# Patient Record
Sex: Male | Born: 1960 | Race: Black or African American | Hispanic: No | Marital: Married | Smoking: Former smoker
Health system: Southern US, Community
[De-identification: ages and names within clinical notes are randomized; demographics above are authoritative.]

## PROBLEM LIST (undated history)

## (undated) ENCOUNTER — Ambulatory Visit: Admission: EM | Discharge status: Absent without leave | Payer: BC Managed Care – PPO

## (undated) DIAGNOSIS — G629 Polyneuropathy, unspecified: Secondary | ICD-10-CM

## (undated) DIAGNOSIS — D869 Sarcoidosis, unspecified: Secondary | ICD-10-CM

## (undated) DIAGNOSIS — K219 Gastro-esophageal reflux disease without esophagitis: Secondary | ICD-10-CM

## (undated) HISTORY — PX: HERNIA REPAIR: SHX51

## (undated) HISTORY — PX: SHOULDER SURGERY: SHX246

## (undated) HISTORY — PX: KNEE SURGERY: SHX244

---

## 2014-06-03 DIAGNOSIS — Z9889 Other specified postprocedural states: Secondary | ICD-10-CM | POA: Insufficient documentation

## 2014-06-03 DIAGNOSIS — S42143A Displaced fracture of glenoid cavity of scapula, unspecified shoulder, initial encounter for closed fracture: Secondary | ICD-10-CM | POA: Insufficient documentation

## 2014-06-03 DIAGNOSIS — M25619 Stiffness of unspecified shoulder, not elsewhere classified: Secondary | ICD-10-CM | POA: Insufficient documentation

## 2014-06-03 DIAGNOSIS — S42153A Displaced fracture of neck of scapula, unspecified shoulder, initial encounter for closed fracture: Secondary | ICD-10-CM

## 2014-06-07 DIAGNOSIS — M6281 Muscle weakness (generalized): Secondary | ICD-10-CM | POA: Insufficient documentation

## 2018-02-25 DIAGNOSIS — R269 Unspecified abnormalities of gait and mobility: Secondary | ICD-10-CM | POA: Insufficient documentation

## 2018-02-25 DIAGNOSIS — D892 Hypergammaglobulinemia, unspecified: Secondary | ICD-10-CM | POA: Insufficient documentation

## 2018-02-25 DIAGNOSIS — R7989 Other specified abnormal findings of blood chemistry: Secondary | ICD-10-CM | POA: Insufficient documentation

## 2018-06-17 DIAGNOSIS — D869 Sarcoidosis, unspecified: Secondary | ICD-10-CM | POA: Insufficient documentation

## 2018-06-17 DIAGNOSIS — D8689 Sarcoidosis of other sites: Secondary | ICD-10-CM | POA: Insufficient documentation

## 2018-06-21 DIAGNOSIS — Z7952 Long term (current) use of systemic steroids: Secondary | ICD-10-CM | POA: Insufficient documentation

## 2018-06-21 DIAGNOSIS — G0491 Myelitis, unspecified: Secondary | ICD-10-CM | POA: Insufficient documentation

## 2018-06-21 DIAGNOSIS — G373 Acute transverse myelitis in demyelinating disease of central nervous system: Secondary | ICD-10-CM | POA: Insufficient documentation

## 2018-07-03 ENCOUNTER — Emergency Department: Payer: BC Managed Care – PPO

## 2018-07-03 ENCOUNTER — Encounter: Payer: Self-pay | Admitting: Emergency Medicine

## 2018-07-03 ENCOUNTER — Emergency Department
Admission: EM | Admit: 2018-07-03 | Discharge: 2018-07-03 | Disposition: A | Discharge status: Home / Self Care | Payer: BC Managed Care – PPO | Attending: Emergency Medicine | Admitting: Emergency Medicine

## 2018-07-03 DIAGNOSIS — Y929 Unspecified place or not applicable: Secondary | ICD-10-CM | POA: Insufficient documentation

## 2018-07-03 DIAGNOSIS — Y998 Other external cause status: Secondary | ICD-10-CM | POA: Diagnosis not present

## 2018-07-03 DIAGNOSIS — S43004A Unspecified dislocation of right shoulder joint, initial encounter: Secondary | ICD-10-CM | POA: Diagnosis not present

## 2018-07-03 DIAGNOSIS — X58XXXA Exposure to other specified factors, initial encounter: Secondary | ICD-10-CM | POA: Diagnosis not present

## 2018-07-03 DIAGNOSIS — Y9389 Activity, other specified: Secondary | ICD-10-CM | POA: Insufficient documentation

## 2018-07-03 DIAGNOSIS — S4991XA Unspecified injury of right shoulder and upper arm, initial encounter: Secondary | ICD-10-CM | POA: Diagnosis present

## 2018-07-03 MED ORDER — BUPIVACAINE HCL (PF) 0.5 % IJ SOLN
INTRAMUSCULAR | Status: AC
  Administered 2018-07-03: 30 mL
  Filled 2018-07-03: qty 30

## 2018-07-03 MED ORDER — KETAMINE HCL 10 MG/ML IJ SOLN
1.0000 mg/kg | Freq: Once | INTRAMUSCULAR | Status: AC
  Administered 2018-07-03: 8.3 mg via INTRAVENOUS

## 2018-07-03 MED ORDER — LIDOCAINE HCL (PF) 1 % IJ SOLN
5.0000 mL | Freq: Once | INTRAMUSCULAR | Status: AC
  Administered 2018-07-03: 5 mL
  Filled 2018-07-03: qty 5

## 2018-07-03 MED ORDER — MORPHINE SULFATE (PF) 4 MG/ML IV SOLN
4.0000 mg | Freq: Once | INTRAVENOUS | Status: AC
  Administered 2018-07-03: 4 mg via INTRAVENOUS
  Filled 2018-07-03: qty 1

## 2018-07-03 MED ORDER — KETAMINE HCL 10 MG/ML IJ SOLN
INTRAMUSCULAR | Status: AC
  Administered 2018-07-03: 8.3 mg via INTRAVENOUS
  Filled 2018-07-03: qty 1

## 2018-07-03 MED ORDER — BUPIVACAINE HCL 0.5 % IJ SOLN
30.0000 mL | Freq: Once | INTRAMUSCULAR | Status: AC
  Administered 2018-07-03: 30 mL
  Filled 2018-07-03: qty 30

## 2018-07-03 NOTE — Sedation Documentation (Signed)
ED Provider at bedside. 

## 2018-07-03 NOTE — ED Provider Notes (Signed)
[facility] Emergency Department Provider Note   ____________________________________________   I have reviewed the triage vital signs and the nursing notes.   HISTORY  Chief Complaint Shoulder Pain (Right)   History limited by: Not Limited   HPI William Lynn is a 57 y.o. male who presents to the emergency department today because of concern for right shoulder pain. Patient states he was taking off his shirt when he developed pain in his right shoulder. States he has dislocated it a number of times in the past. Has been a couple of years since the last time he dislocated it. Denies any associated numbness. Was given 75 mcg of fentanyl by EMS.     Per medical record review patient has a history of shoulder dislocation in the past.   No past medical history on file.  There are no active problems to display for this patient.   Prior to Admission medications   Not on File    Allergies Patient has no allergy information on record.  No family history on file.  Social History Social History   Tobacco Use  . Smoking status: Never Smoker  . Smokeless tobacco: Never Used  Substance Use Topics  . Alcohol use: Never    Frequency: Never  . Drug use: Never    Review of Systems Constitutional: No fever/chills Eyes: No visual changes. ENT: No sore throat. Cardiovascular: Denies chest pain. Respiratory: Denies shortness of breath. Gastrointestinal: No abdominal pain.  No nausea, no vomiting.  No diarrhea.   Genitourinary: Negative for dysuria. Musculoskeletal: Positive for right shoulder pain. Skin: Negative for rash. Neurological: Negative for headaches, focal weakness or numbness.  ____________________________________________   PHYSICAL EXAM:  VITAL SIGNS: ED Triage Vitals  Enc Vitals Group     BP 141/100     Pulse 90     Resp 18     Temp 98.1     Temp src      SpO2 96   Constitutional: Alert and oriented.  Eyes: Conjunctivae are  normal.  ENT      Head: Normocephalic and atraumatic.      Nose: No congestion/rhinnorhea.      Mouth/Throat: Mucous membranes are moist.      Neck: No stridor. Hematological/Lymphatic/Immunilogical: No cervical lymphadenopathy. Cardiovascular: Normal rate, regular rhythm.  No murmurs, rubs, or gallops.  Respiratory: Normal respiratory effort without tachypnea nor retractions. Breath sounds are clear and equal bilaterally. No wheezes/rales/rhonchi. Gastrointestinal: Soft and non tender. No rebound. No guarding.  Genitourinary: Deferred Musculoskeletal: Right shoulder with mild deformity.  Neurologic:  Normal speech and language. No gross focal neurologic deficits are appreciated.  Skin:  Skin is warm, dry and intact. No rash noted. Psychiatric: Mood and affect are normal. Speech and behavior are normal. Patient exhibits appropriate insight and judgment.  ____________________________________________    LABS (pertinent positives/negatives)  None  ____________________________________________   EKG  None  ____________________________________________    RADIOLOGY  Right shoulder x-ray Anterior dislocation  Right shoulder x-ray  Successful reduction  I, Mariza Bourget, personally viewed and evaluated these images (plain radiographs) as part of my medical decision making, as well as reviewing the written report by the radiologist.  ____________________________________________   PROCEDURES  .Sedation Date/Time: 07/03/2018 4:25 PM Performed by: Phineas SemenGoodman, Aadvik Roker, MD Authorized by: Phineas SemenGoodman, Hawraa Stambaugh, MD   Consent:    Consent obtained:  Written (electronic informed consent)   Consent given by:  Patient   Risks discussed:  Dysrhythmia, inadequate sedation, nausea, vomiting, respiratory compromise necessitating ventilatory  assistance and intubation, prolonged sedation necessitating reversal and prolonged hypoxia resulting in organ damage   Alternatives discussed:  Regional  anesthesia and analgesia without sedation Universal protocol:    Procedure explained and questions answered to patient or proxy's satisfaction: yes     Relevant documents present and verified: yes     Test results available and properly labeled: yes     Imaging studies available: yes     Required blood products, implants, devices, and special equipment available: yes     Immediately prior to procedure a time out was called: yes     Patient identity confirmation method:  Arm band Indications:    Procedure performed:  Dislocation reduction   Procedure necessitating sedation performed by:  Physician performing sedation   Intended level of sedation:  Moderate (conscious sedation) Pre-sedation assessment:    Time since last food or drink:  Unknown   ASA classification: class 1 - normal, healthy patient     Neck mobility: normal     Mouth opening:  3 or more finger widths   Mallampati score:  II - soft palate, uvula, fauces visible   Pre-sedation assessments completed and reviewed: airway patency, cardiovascular function, hydration status, mental status, nausea/vomiting, pain level, respiratory function and temperature   Immediate pre-procedure details:    Reassessment: Patient reassessed immediately prior to procedure     Reviewed: vital signs, relevant labs/tests and NPO status     Verified: bag valve mask available, emergency equipment available, intubation equipment available, IV patency confirmed, oxygen available, reversal medications available and suction available   Procedure details (see MAR for exact dosages):    Preoxygenation:  Room air   Sedation:  Ketamine   Intra-procedure monitoring:  Blood pressure monitoring, continuous pulse oximetry, cardiac monitor, frequent vital sign checks and frequent LOC assessments   Intra-procedure events: none     Total Provider sedation time (minutes):  15 Post-procedure details:    Post-sedation assessment completed:  07/03/2018 5:03 PM    Attendance: Constant attendance by certified staff until patient recovered     Recovery: Patient returned to pre-procedure baseline     Post-sedation assessments completed and reviewed: airway patency, cardiovascular function, hydration status, mental status and respiratory function     Patient is stable for discharge or admission: yes     Patient tolerance:  Tolerated well, no immediate complications    ____________________________________________   INITIAL IMPRESSION / ASSESSMENT AND PLAN / ED COURSE  Pertinent labs & imaging results that were available during my care of the patient were reviewed by me and considered in my medical decision making (see chart for details).   Patient presented to the emergency department today because of concerns for right shoulder pain and possible dislocation.  On exam there is some deformity to the right shoulder.  Initial x-ray does show anterior dislocation.  Did initially tie reduction without sedation and patient was swelling however since successful reduction was not accomplished.  Patient was then sedated with ketamine.  He tolerated the sedation well and the shoulder was able to be reduced.  Traction countertraction was utilized.  Discussed with patient care of dislocated shoulder.  Will give orthopedic follow-up.  ____________________________________________   FINAL CLINICAL IMPRESSION(S) / ED DIAGNOSES  Final diagnoses:  Dislocation of right shoulder joint, initial encounter     Note: This dictation was prepared with Dragon dictation. Any transcriptional errors that result from this process are unintentional     Phineas Semen, MD 07/03/18 1750

## 2018-07-03 NOTE — ED Triage Notes (Signed)
Patient arrived via EMS with co right shoulder pain.  Pt states sh has has rotator cuff surgery on this shoulder before and was taking his shirt off today and felt a pop in his shoulder followed by pain.  EMD established IV and gave 75mcg F[facility]. Arm is stabilized by sling.  MD present during triage.

## 2018-07-03 NOTE — ED Notes (Signed)
XR at bedside

## 2018-07-03 NOTE — Discharge Instructions (Addendum)
As discussed it is important that you work on some range of motion exercises with your shoulder a number of times a day. Please no lifting or moving your arm over shoulder level. Please seek medical attention for any high fevers, chest pain, shortness of breath, change in behavior, persistent vomiting, bloody stool or any other new or concerning symptoms.

## 2018-07-03 NOTE — Sedation Documentation (Signed)
Family at bedside. 

## 2019-01-11 ENCOUNTER — Emergency Department: Payer: BC Managed Care – PPO

## 2019-01-11 ENCOUNTER — Emergency Department
Admission: EM | Admit: 2019-01-11 | Discharge: 2019-01-11 | Disposition: A | Discharge status: Home / Self Care | Payer: BC Managed Care – PPO | Attending: Emergency Medicine | Admitting: Emergency Medicine

## 2019-01-11 ENCOUNTER — Other Ambulatory Visit: Payer: Self-pay

## 2019-01-11 DIAGNOSIS — Y999 Unspecified external cause status: Secondary | ICD-10-CM | POA: Insufficient documentation

## 2019-01-11 DIAGNOSIS — Y92003 Bedroom of unspecified non-institutional (private) residence as the place of occurrence of the external cause: Secondary | ICD-10-CM | POA: Insufficient documentation

## 2019-01-11 DIAGNOSIS — Y33XXXA Other specified events, undetermined intent, initial encounter: Secondary | ICD-10-CM | POA: Diagnosis not present

## 2019-01-11 DIAGNOSIS — S43014A Anterior dislocation of right humerus, initial encounter: Secondary | ICD-10-CM | POA: Diagnosis not present

## 2019-01-11 DIAGNOSIS — S4991XA Unspecified injury of right shoulder and upper arm, initial encounter: Secondary | ICD-10-CM | POA: Diagnosis present

## 2019-01-11 DIAGNOSIS — Y9389 Activity, other specified: Secondary | ICD-10-CM | POA: Insufficient documentation

## 2019-01-11 MED ORDER — PROPOFOL 1000 MG/100ML IV EMUL
INTRAVENOUS | Status: AC
  Filled 2019-01-11: qty 100

## 2019-01-11 MED ORDER — LIDOCAINE HCL (PF) 1 % IJ SOLN
5.0000 mL | Freq: Once | INTRAMUSCULAR | Status: AC
  Administered 2019-01-11: 5 mL via INTRADERMAL

## 2019-01-11 MED ORDER — MORPHINE SULFATE (PF) 2 MG/ML IV SOLN
1.0000 mg | Freq: Once | INTRAVENOUS | Status: AC
  Administered 2019-01-11: 1 mg via INTRAVENOUS

## 2019-01-11 MED ORDER — PROPOFOL 10 MG/ML IV BOLUS
100.0000 mg | Freq: Once | INTRAVENOUS | Status: AC
  Administered 2019-01-11: 100 mg via INTRAVENOUS

## 2019-01-11 NOTE — Discharge Instructions (Addendum)
Please make sure you follow up with the orthopedic surgeon for a recheck.  If you don't get surgery you'll keep having dislocations in the future.  It was a pleasure to take care of you today, and thank you for coming to our emergency department.  If you have any questions or concerns before leaving please ask the nurse to grab me and I'm more than happy to go through your aftercare instructions again.  If you have any concerns once you are home that you are not improving or are in fact getting worse before you can make it to your follow-up appointment, please do not hesitate to call [phone] and come back for further evaluation.  Merrily Brittle, MD  While here in the ER today you received very powerful medicine that makes it unsafe for you to drive for the rest of the day.  Do not drive until tomorrow.

## 2019-01-11 NOTE — ED Triage Notes (Signed)
Pt arrived from home via EMS with complaints of right shoulder dislocation. Pt has an extensive Hx of right shoulder dislocation. Pt was asleep when this happened. EMS gave 100mg  of Fentanyl en route. EMS placed a 20 in left wrist. Pt is alert and oriented x 4. Pt is calm and cooperative. VS per EMS HR-99 O2sat-99%RA BS-141. Pt has NKA.

## 2019-01-11 NOTE — ED Provider Notes (Signed)
[facility] Emergency Department Provider Note  ____________________________________________   First MD Initiated Contact with Patient 01/11/19 [phone]     (approximate)  I have reviewed the triage vital signs and the nursing notes.   HISTORY  Chief Complaint No chief complaint on file.    HPI Lynwood Snouffer is a 58 y.o. male comes to the emergency department via EMS with atraumatic right shoulder dislocation that awoke him from sleep.  His right shoulder has been dislocated multiple times in the past and he says he has been advised to have surgery to prevent further dislocations however he cannot afford the surgery.  He went to sleep last night in his usual state of health and awoke with the pain.  EMS administered 50 mcg of IV fentanyl twice with essentially no change in his symptoms.  He says that he has never been able to have his shoulder reduced without intravenous sedation.   He denies weakness but does report some numbness on his right lateral shoulder.  His symptoms were sudden onset severe worse with any movement and somewhat improved with rest.   History reviewed. No pertinent past medical history.  There are no active problems to display for this patient.   Past Surgical History:  Procedure Laterality Date  . HERNIA REPAIR    . KNEE SURGERY Right   . SHOULDER SURGERY Bilateral     Prior to Admission medications   Not on File    Allergies Patient has no allergy information on record.  History reviewed. No pertinent family history.  Social History Social History   Tobacco Use  . Smoking status: Never Smoker  . Smokeless tobacco: Never Used  Substance Use Topics  . Alcohol use: Never    Frequency: Never  . Drug use: Never    Review of Systems Constitutional: No fever/chills Cardiovascular: Denies chest pain. Respiratory: Denies shortness of breath. Gastrointestinal: No abdominal pain.  Positive for nausea negative for  vomiting Musculoskeletal: Positive for shoulder pain Neurological: Negative for headaches   ____________________________________________   PHYSICAL EXAM:  VITAL SIGNS: ED Triage Vitals  Enc Vitals Group     BP      Pulse      Resp      Temp      Temp src      SpO2      Weight      Height      Head Circumference      Peak Flow      Pain Score      Pain Loc      Pain Edu?      Excl. in GC?     Constitutional: Alert and oriented x4 appears extremely uncomfortable grimacing and groaning in pain Head: Atraumatic. Neck: No stridor.   Cardiovascular: Tachycardic regular rhythm no murmurs Respiratory: Normal respiratory effort.  No retractions. MSK: Right shoulder with obvious deformity.  Arm held slightly abducted and exquisite discomfort with internal or external rotation.  Some decreased sensation over axillary nerve distribution although otherwise neurovascularly intact Neurologic:  Normal speech and language.  Decreased sensation to axillary nerve distribution on the right as above Skin: Diaphoretic    ____________________________________________  LABS (all labs ordered are listed, but only abnormal results are displayed)  Labs Reviewed - No data to display   __________________________________________  EKG   ____________________________________________  RADIOLOGY  First right shoulder x-ray reviewed by me shows anterior shoulder dislocation with no evidence of fracture Second x-ray reviewed by me  shows successful reduction of the dislocation ____________________________________________   DIFFERENTIAL includes but not limited to  Anterior shoulder dislocation, posterior shoulder dislocation, fracture, AC separation   PROCEDURES  Procedure(s) performed: Yes  .Sedation Date/Time: 01/11/2019 5:20 AM Performed by: Merrily Brittleifenbark, Appolonia Ackert, MD Authorized by: Merrily Brittleifenbark, Javanni Maring, MD   Consent:    Consent obtained:  Verbal   Consent given by:  Patient   Risks  discussed:  Allergic reaction, dysrhythmia, inadequate sedation, nausea, prolonged hypoxia resulting in organ damage, prolonged sedation necessitating reversal, respiratory compromise necessitating ventilatory assistance and intubation and vomiting   Alternatives discussed:  Analgesia without sedation, anxiolysis and regional anesthesia Universal protocol:    Procedure explained and questions answered to patient or proxy's satisfaction: yes     Relevant documents present and verified: yes     Test results available and properly labeled: yes     Imaging studies available: yes     Required blood products, implants, devices, and special equipment available: yes     Site/side marked: yes     Immediately prior to procedure a time out was called: yes     Patient identity confirmation method:  Verbally with patient Indications:    Procedure performed:  Dislocation reduction   Procedure necessitating sedation performed by:  Physician performing sedation Pre-sedation assessment:    Time since last food or drink:  8 hours   ASA classification: class 2 - patient with mild systemic disease     Neck mobility: normal     Mouth opening:  3 or more finger widths   Thyromental distance:  4 finger widths   Mallampati score:  II - soft palate, uvula, fauces visible   Pre-sedation assessments completed and reviewed: airway patency, cardiovascular function, hydration status, mental status, nausea/vomiting, pain level, respiratory function and temperature     Pre-sedation assessment completed:  01/11/2019 5:20 AM Immediate pre-procedure details:    Reassessment: Patient reassessed immediately prior to procedure     Reviewed: vital signs, relevant labs/tests and NPO status     Verified: bag valve mask available, emergency equipment available, intubation equipment available, IV patency confirmed, oxygen available and suction available   Procedure details (see MAR for exact dosages):    Preoxygenation:  Nasal  cannula   Sedation:  Propofol   Analgesia:  Fentanyl (by EMS)   Intra-procedure monitoring:  Blood pressure monitoring, cardiac monitor, continuous pulse oximetry, frequent LOC assessments, frequent vital sign checks and continuous capnometry   Intra-procedure events: hypoxia     Intra-procedure management:  Airway repositioning and supplemental oxygen   Total Provider sedation time (minutes):  17 Post-procedure details:    Post-sedation assessment completed:  01/11/2019 6:45 AM   Attendance: Constant attendance by certified staff until patient recovered     Recovery: Patient returned to pre-procedure baseline     Post-sedation assessments completed and reviewed: airway patency, cardiovascular function, hydration status, mental status, nausea/vomiting, pain level, respiratory function and temperature     Patient is stable for discharge or admission: yes     Patient tolerance:  Tolerated well, no immediate complications Comments:     I gave the patient 100 mg of IV propofol at the shoulder reduction.  After the shoulder was reduced and I took away the painful stimulus the patient had decreased respirations.  He never actually stopped breathing although his respirations dropped to about 8 times a minute and they were more shallow.  He briefly became hypoxic to about 84 or 85% on room air so I turned on his  nasal cannula and provided a gentle jaw thrust for about 30 seconds which brought his saturation up to 99 to 100% and he subsequently woke up appropriately.  Celedonio Miyamoto Block Date/Time: 01/11/2019 10:05 AM Performed by: Merrily Brittle, MD Authorized by: Merrily Brittle, MD   Consent:    Consent obtained:  Verbal   Consent given by:  Patient   Risks discussed:  Pain, unsuccessful block, swelling and allergic reaction   Alternatives discussed:  Alternative treatment Indications:    Indications:  Procedural anesthesia and pain relief Location:    Body area:  Upper extremity   Upper extremity  nerve blocked: intraarticular injection of right shoulder.   Laterality:  Right Pre-procedure details:    Skin preparation:  2% chlorhexidine   Preparation: Patient was prepped and draped in usual sterile fashion   Skin anesthesia (see MAR for exact dosages):    Skin anesthesia method:  None Procedure details (see MAR for exact dosages):    Block needle gauge:  20 G   Block anesthetic: lidocaine 1% without epinephrine mixed with 1mg  of morphine.   Steroid injected:  None   Additive injected: morphine sulfate.   Injection procedure:  Anatomic landmarks identified, anatomic landmarks palpated and incremental injection   Paresthesia:  None Post-procedure details:    Dressing:  None   Outcome:  Pain relieved   Patient tolerance of procedure:  Tolerated well, no immediate complications Comments:     I used a 20-gauge spinal needle via posterior approach after cleansing his skin with chlorhexidine and 9 cc of 1% lidocaine without epinephrine and 1 mg of morphine.  I easily entered his joint space obtained a flash of blood then easily infused the medication with improvement in his pain Reduction of dislocation Date/Time: 01/11/2019 6:15 AM Performed by: Merrily Brittle, MD Authorized by: Merrily Brittle, MD  Consent: Verbal consent obtained. Risks and benefits: risks, benefits and alternatives were discussed Consent given by: patient Patient understanding: patient states understanding of the procedure being performed Required items: required blood products, implants, devices, and special equipment available Patient identity confirmed: verbally with patient Time out: Immediately prior to procedure a "time out" was called to verify the correct patient, procedure, equipment, support staff and site/side marked as required. Local anesthesia used: yes Anesthesia: hematoma block  Anesthesia: Local anesthesia used: yes  Sedation: Patient sedated: yes Sedation type: (Deep sedation with  propofol) Sedatives: propofol Analgesia: fentanyl Vitals: Vital signs were monitored during sedation.  Patient tolerance: Patient tolerated the procedure well with no immediate complications Comments: After propofol was given I reduced the patient's anterior shoulder dislocation using scapular manipulation  .Splint Application Date/Time: 01/11/2019 6:30 AM Performed by: Merrily Brittle, MD Authorized by: Merrily Brittle, MD   Consent:    Consent obtained:  Verbal   Consent given by:  Patient   Risks discussed:  Discoloration, numbness, pain and swelling   Alternatives discussed:  Alternative treatment Pre-procedure details:    Sensation:  Numbness and weakness   Skin color:  Normal Procedure details:    Laterality:  Right   Location:  Shoulder   Shoulder:  R shoulder   Strapping: yes     Splint type: Shoulder immobilizer. Post-procedure details:    Pain:  Improved   Sensation:  Normal   Patient tolerance of procedure:  Tolerated well, no immediate complications    Critical Care performed: no  ____________________________________________   INITIAL IMPRESSION / ASSESSMENT AND PLAN / ED COURSE  Pertinent labs & imaging results that were available during  my care of the patient were reviewed by me and considered in my medical decision making (see chart for details).   As part of my medical decision making, I reviewed the following data within the electronic MEDICAL RECORD NUMBER History obtained from family if available, nursing notes, old chart and ekg, as well as notes from prior ED visits.  The patient comes to the emergency department with recurrent anterior right shoulder dislocation.  It was atraumatic.  He has had multiple in the past and says that he has never been able to have it successfully reduced without intravenous sedation.  EMS gave the patient 100 mcg of IV fentanyl with minimal improvement in his symptoms.  He did consent to intra-articular injection which I  performed with 1% lidocaine without epinephrine along with 1 mg of morphine to try to get long-acting pain control via intra-articular opioid receptors.  Following the successful injection the patient consented for propofol sedation and I performed the sedation myself with 100 mg of propofol with blood pressure, pulse oximetry, and cardiac monitoring.  I was able to successfully reduce the patient's dislocation and subsequently placed him in a shoulder immobilizer.  Repeat x-ray confirmed successful reduction.  I will refer him back to orthopedic surgery.  He is not driving.  Strict return precautions have been given.      ____________________________________________   FINAL CLINICAL IMPRESSION(S) / ED DIAGNOSES  Final diagnoses:  Anterior dislocation of right shoulder, initial encounter      NEW MEDICATIONS STARTED DURING THIS VISIT:  There are no discharge medications for this patient.    Note:  This document was prepared using Dragon voice recognition software and may include unintentional dictation errors.     Merrily Brittleifenbark, Ulices Maack, MD 01/11/19 1016

## 2019-02-23 ENCOUNTER — Encounter: Payer: Self-pay | Admitting: Podiatry

## 2019-02-23 ENCOUNTER — Ambulatory Visit: Payer: BC Managed Care – PPO | Admitting: Podiatry

## 2019-02-23 ENCOUNTER — Other Ambulatory Visit: Payer: Self-pay | Admitting: Podiatry

## 2019-02-23 ENCOUNTER — Ambulatory Visit (INDEPENDENT_AMBULATORY_CARE_PROVIDER_SITE_OTHER): Payer: BC Managed Care – PPO

## 2019-02-23 VITALS — BP 126/80 | HR 85

## 2019-02-23 DIAGNOSIS — G5791 Unspecified mononeuropathy of right lower limb: Secondary | ICD-10-CM

## 2019-02-23 DIAGNOSIS — M79671 Pain in right foot: Secondary | ICD-10-CM

## 2019-02-23 DIAGNOSIS — G629 Polyneuropathy, unspecified: Secondary | ICD-10-CM

## 2019-02-23 DIAGNOSIS — M79672 Pain in left foot: Principal | ICD-10-CM

## 2019-03-02 ENCOUNTER — Telehealth: Payer: Self-pay | Admitting: Podiatry

## 2019-03-02 NOTE — Telephone Encounter (Signed)
Pt called and wanting info on a nerve test he was to have ordered. Ordered by Dr Logan Bores.  Awaiting call from [facility] Nurse as the neurologist has not received order for test.

## 2019-03-04 ENCOUNTER — Telehealth: Payer: Self-pay

## 2019-03-04 NOTE — Telephone Encounter (Signed)
-----   Message from Felecia Shelling, [place] sent at 02/23/2019  4:07 PM EST ----- Regarding: nerve conduction study right Please order nerve conduction study right lower extremity.   Dx: RLE idiopathic neuropathy   Thanks, Dr. Logan Bores

## 2019-03-04 NOTE — Telephone Encounter (Signed)
Appt scheduled for Neuro dept [facility] on 03/29/2019 @ 7:45am with Dr. Clelia Croft.  Patient was informed of appt via voice mail.

## 2019-03-06 NOTE — Progress Notes (Signed)
   HPI: 58 year old male presents the office today for evaluation of cold tingling numbness the right lower extremity throughout the foot and ankle.  This is been ongoing for years now.  Gradual onset.  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  Patient experiences cold tingling and numbness to the right lower extremity.  Previous doctors of simply tried to manage the patient's symptoms and patient states that they have not attempted to find the root cause of the problem.  They present today for second opinion and for evaluation  History reviewed. No pertinent past medical history.   Physical Exam: General: The patient is alert and oriented x3 in no acute distress.  Dermatology: Skin is warm, dry and supple bilateral lower extremities. Negative for open lesions or macerations.  Vascular: Palpable pedal pulses bilaterally. No edema or erythema noted. Capillary refill within normal limits.  Neurological: Epicritic and protective threshold grossly intact bilaterally.  There is paresthesia noted with light touch throughout the right foot and ankle.  Paresthesia with numbness extends all the way proximal to the level of the knee.  Negative for any Tinel sign.  Musculoskeletal Exam: Range of motion within normal limits to all pedal and ankle joints bilateral. Muscle strength 5/5 in all groups bilateral.  Patient denies any lumbar problems or lumbar radiculopathy  Radiographic Exam:  Normal osseous mineralization. Joint spaces preserved. No fracture/dislocation/boney destruction.    Assessment: 1.  Idiopathic right lower extremity polyneuropathy   Plan of Care:  1. Patient evaluated. X-Rays reviewed.  2.  Today were going to order nerve conduction study right lower extremity to determine if there is any large motor neuron impingement or pathology 3.  Return to clinic in 3 weeks.  If results are normal we will go ahead and proceed with a small  fiber nerve biopsy       Felecia Shelling, DPM [facility]  Dr. Felecia Shelling, DPM    [address] [place], [place] [address]                Office [phone]  Fax [phone]

## 2019-03-16 ENCOUNTER — Encounter: Payer: Self-pay | Admitting: Podiatry

## 2019-03-16 ENCOUNTER — Ambulatory Visit (INDEPENDENT_AMBULATORY_CARE_PROVIDER_SITE_OTHER): Payer: BC Managed Care – PPO | Admitting: Podiatry

## 2019-03-16 ENCOUNTER — Other Ambulatory Visit: Payer: Self-pay

## 2019-03-16 DIAGNOSIS — G5791 Unspecified mononeuropathy of right lower limb: Secondary | ICD-10-CM

## 2019-03-18 NOTE — Progress Notes (Signed)
   HPI: 58 year old male presenting to the office today for follow up evaluation of polyneuropathy of the right lower extremity. He states his condition is unchanged. He reports not being able to feet his toes some days. He has not had the nerve conduction study done yet. Patient is here for further evaluation and treatment.   History reviewed. No pertinent past medical history.   Physical Exam: General: The patient is alert and oriented x3 in no acute distress.  Dermatology: Skin is warm, dry and supple bilateral lower extremities. Negative for open lesions or macerations.  Vascular: Palpable pedal pulses bilaterally. No edema or erythema noted. Capillary refill within normal limits.  Neurological: Epicritic and protective threshold grossly intact bilaterally.  There is paresthesia noted with light touch throughout the right foot and ankle.  Paresthesia with numbness extends all the way proximal to the level of the knee.  Negative for any Tinel sign.  Musculoskeletal Exam: Range of motion within normal limits to all pedal and ankle joints bilateral. Muscle strength 5/5 in all groups bilateral.  Patient denies any lumbar problems or lumbar radiculopathy  Assessment: 1. Idiopathic right lower extremity polyneuropathy   Plan of Care:  1. Patient evaluated.  2. Nerve conduction study is scheduled for 03/29/2019.  3. Return to clinic after nerve conduction study. If results are normal, we will go ahead and proceed with a small fiber nerve biopsy.     Felecia Shelling, DPM [facility]  Dr. Felecia Shelling, DPM    [address] [place], [place] [address]                Office [phone]  Fax [phone]

## 2019-03-30 ENCOUNTER — Telehealth: Payer: Self-pay | Admitting: Podiatry

## 2019-03-30 ENCOUNTER — Ambulatory Visit: Payer: BC Managed Care – PPO | Admitting: Podiatry

## 2019-03-30 NOTE — Telephone Encounter (Signed)
Patient requests phone call with test results. Cancelled appointment due to virus.

## 2019-03-31 NOTE — Telephone Encounter (Signed)
Dr. Christy Gentles clinic faxed results today.  They have not been scanned into computer yet.  I will inform patient that you will call him Friday

## 2019-03-31 NOTE — Telephone Encounter (Signed)
It looks like the patient has not done the test yet. I will call when test is performed and results become available.  Dr. Logan Bores

## 2019-04-02 ENCOUNTER — Telehealth: Payer: Self-pay | Admitting: Podiatry

## 2019-04-02 NOTE — Telephone Encounter (Signed)
Spoke with the patient on the phone today regarding nerve conduction study results.  I explained the results and recommend follow-up with his neurologist that he is currently seeing at [facility].  I explained that there is nothing further I can provide and recommend follow-up with neurology.  Return to clinic as needed  Felecia Shelling, DPM [facility]  Dr. Felecia Shelling, DPM    [address] [place], [place] [address]                Office [phone]  Fax [phone]

## 2019-10-11 ENCOUNTER — Other Ambulatory Visit: Payer: Self-pay

## 2019-10-11 ENCOUNTER — Emergency Department
Admission: EM | Admit: 2019-10-11 | Discharge: 2019-10-11 | Disposition: A | Discharge status: Home / Self Care | Payer: BC Managed Care – PPO | Attending: Emergency Medicine | Admitting: Emergency Medicine

## 2019-10-11 ENCOUNTER — Emergency Department: Payer: BC Managed Care – PPO

## 2019-10-11 DIAGNOSIS — S43304A Dislocation of unspecified parts of right shoulder girdle, initial encounter: Secondary | ICD-10-CM | POA: Insufficient documentation

## 2019-10-11 DIAGNOSIS — X509XXA Other and unspecified overexertion or strenuous movements or postures, initial encounter: Secondary | ICD-10-CM | POA: Insufficient documentation

## 2019-10-11 DIAGNOSIS — Z79899 Other long term (current) drug therapy: Secondary | ICD-10-CM | POA: Insufficient documentation

## 2019-10-11 DIAGNOSIS — Y9389 Activity, other specified: Secondary | ICD-10-CM | POA: Diagnosis not present

## 2019-10-11 DIAGNOSIS — S4981XA Other specified injuries of right shoulder and upper arm, initial encounter: Secondary | ICD-10-CM | POA: Diagnosis present

## 2019-10-11 DIAGNOSIS — Y929 Unspecified place or not applicable: Secondary | ICD-10-CM | POA: Diagnosis not present

## 2019-10-11 DIAGNOSIS — Y999 Unspecified external cause status: Secondary | ICD-10-CM | POA: Insufficient documentation

## 2019-10-11 DIAGNOSIS — S43004A Unspecified dislocation of right shoulder joint, initial encounter: Secondary | ICD-10-CM

## 2019-10-11 MED ORDER — FENTANYL CITRATE (PF) 100 MCG/2ML IJ SOLN
100.0000 ug | Freq: Once | INTRAMUSCULAR | Status: AC
  Administered 2019-10-11: 04:00:00 100 ug via INTRAVENOUS
  Filled 2019-10-11: qty 2

## 2019-10-11 MED ORDER — ETOMIDATE 2 MG/ML IV SOLN
0.1000 mg/kg | Freq: Once | INTRAVENOUS | Status: AC
  Administered 2019-10-11: 04:00:00 9.76 mg via INTRAVENOUS
  Filled 2019-10-11: qty 10

## 2019-10-11 NOTE — ED Notes (Signed)
Pt placed on end tidal co2 monitoring, oxygen at 1lpm in preparation for conscious sedation. Pt on cardiac monitor in nsr.

## 2019-10-11 NOTE — ED Notes (Signed)
Pt provided with ginger ale 

## 2019-10-11 NOTE — ED Triage Notes (Signed)
Pt was reaching into his toolbox this am when he dislocated his right shoulder, cms intact to fingers. 2+ radial pulse noted. Pt holding arm in position of comfort. Pt with frequent shoulder dislocations.

## 2019-10-11 NOTE — ED Notes (Signed)
Pt speaking on phone with spouse. Pt denies needs. Plan of care discussed with pt. Pt verbalizes understanding, denies pain at this time. Pt arrived with own shoulder immobilizer. Pt's personal shoulder immobilizer placed on pt after sedation reduction complete.

## 2019-10-11 NOTE — ED Notes (Signed)
Pt tolerated ginger ale. Pt denies needs at this time.

## 2019-10-11 NOTE — ED Provider Notes (Signed)
[facility] Emergency Department Provider Note  ____________________________________________  Time seen: Approximately 4:46 AM  I have reviewed the triage vital signs and the nursing notes.   HISTORY  Chief Complaint Shoulder Injury   HPI William Lynn is a 58 y.o. male with a history of recurrent right shoulder dislocations who presents for evaluation of right shoulder dislocation.  Patient was reaching into a box while setting up the room for his baby  when his right shoulder popped out of place.  He is complaining of moderate constant pain worse with movement.  No trauma.  No past medical history on file.  Patient Active Problem List   Diagnosis Date Noted  . Myelitis (HCC) 06/21/2018  . On corticosteroid therapy 06/21/2018  . Sarcoidosis 06/17/2018  . Gait abnormality 02/25/2018  . Low serum thyroid stimulating hormone (TSH) 02/25/2018  . Serum gammaglobulin increased 02/25/2018  . Muscle weakness (generalized) 06/07/2014  . Closed fracture of glenoid cavity and neck of scapula 06/03/2014  . S/P arthroscopy of shoulder 06/03/2014  . Stiffness of joint, not elsewhere classified,  shoulder region 06/03/2014    Past Surgical History:  Procedure Laterality Date  . HERNIA REPAIR    . KNEE SURGERY Right   . SHOULDER SURGERY Bilateral     Prior to Admission medications   Medication Sig Start Date End Date Taking? Authorizing Provider  cetirizine (ZYRTEC) 10 MG tablet TAKE 1 TABLET BY MOUTH EVERY DAY EVERY NIGHT 11/02/18   [provider]  Cholecalciferol (VITAMIN D3) 75 MCG (3000 UT) TABS Take by mouth.    [provider]  clobetasol ointment (TEMOVATE) 0.05 % APPLY TOPICALLY TWO (2) TIMES A DAY. FOR RASHES, LARGE BSA 01/13/19   [provider]  diazepam (VALIUM) 10 MG tablet TAKE 1 TABLET BY MOUTH ONCE FOR ANXIETY 30 MINUTES BEFORE MRI, AND 1 TAB AT THE TIME OF THE MRI 07/10/18   [provider]  loratadine  (CLARITIN) 10 MG tablet TAKE 10 MG BY MOUTH NIGHTLY. AS NEEDED FOR ITCHING 01/12/19   [provider]  methylPREDNISolone (MEDROL DOSEPAK) 4 MG TBPK tablet as directed 06/11/18   [provider]  oxyCODONE (OXY IR/ROXICODONE) 5 MG immediate release tablet  10/04/10   [provider]  pantoprazole (PROTONIX) 20 MG tablet TAKE 1 TABLET (20 MG TOTAL) BY MOUTH DAILY. 30 MIN BEFORE BREAKFAST. 11/03/18   [provider]  pantoprazole (PROTONIX) 40 MG tablet TAKE 1 TABLET (40 MG TOTAL) BY MOUTH DAILY. 30 MIN BEFORE BREAKFAST. DURING STEROID TREATMENT. 01/12/19   [provider]  predniSONE (DELTASONE) 20 MG tablet TAKE 40 MG/DAY FOR A MONTH, UNTIL YOU SEE DERMATOLOGY. 01/12/19   [provider]  prochlorperazine (COMPAZINE) 10 MG tablet TAKE 1 TABLET BY MOUTH TWICE A DAY AS NEEDED NAUSEA 06/11/18   [provider]  sulfamethoxazole-trimethoprim (BACTRIM,SEPTRA) 400-80 MG tablet Take by mouth. 09/13/18   [provider]    Allergies Patient has no known allergies.  No family history on file.  Social History Social History   Tobacco Use  . Smoking status: Never Smoker  . Smokeless tobacco: Never Used  Substance Use Topics  . Alcohol use: Never    Frequency: Never  . Drug use: Never    Review of Systems  Constitutional: Negative for fever. Eyes: Negative for visual changes. ENT: Negative for sore throat. Neck: No neck pain  Cardiovascular: Negative for chest pain. Respiratory: Negative for shortness of breath. Gastrointestinal: Negative for abdominal pain, vomiting or diarrhea. Genitourinary:  Negative for dysuria. Musculoskeletal: Negative for back pain. + R shoulder pain Skin: Negative for rash. Neurological: Negative for headaches, weakness or numbness. Psych: No SI or HI  ____________________________________________   PHYSICAL EXAM:  VITAL SIGNS: ED Triage Vitals  Enc Vitals Group     BP 10/11/19 0339 (!)  187/128     Pulse Rate 10/11/19 0339 85     Resp 10/11/19 0339 18     Temp 10/11/19 0339 98.2 F (36.8 C)     Temp Source 10/11/19 0339 Oral     SpO2 10/11/19 0339 98 %     Weight 10/11/19 0340 215 lb (97.5 kg)     Height 10/11/19 0340 6\' 5"  (1.956 m)     Head Circumference --      Peak Flow --      Pain Score 10/11/19 0340 10     Pain Loc --      Pain Edu? --      Excl. in GC? --     Constitutional: Alert and oriented. Well appearing and in no apparent distress. HEENT:      Head: Normocephalic and atraumatic.         Eyes: Conjunctivae are normal. Sclera is non-icteric.       Mouth/Throat: Mucous membranes are moist.       Neck: Supple with no signs of meningismus. Cardiovascular: Regular rate and rhythm. No murmurs, gallops, or rubs. 2+ symmetrical distal pulses are present in all extremities. No JVD. Respiratory: Normal respiratory effort. Lungs are clear to auscultation bilaterally. No wheezes, crackles, or rhonchi.  Gastrointestinal: Soft, non tender Musculoskeletal: Anterior dislocation of the right shoulder with intact distal perfusion  neurologic: Normal speech and language. Face is symmetric. Moving all extremities. No gross focal neurologic deficits are appreciated. Skin: Skin is warm, dry and intact. No rash noted. Psychiatric: Mood and affect are normal. Speech and behavior are normal.  ____________________________________________   LABS (all labs ordered are listed, but only abnormal results are displayed)  Labs Reviewed - No data to display ____________________________________________  EKG  none  ____________________________________________  RADIOLOGY  I have personally reviewed the images performed during this visit and I agree with the Radiologist's read.   Interpretation by Radiologist:  Dg Shoulder Right  Result Date: 10/11/2019 CLINICAL DATA:  Right shoulder reduction EXAM: RIGHT SHOULDER - 2+ VIEW COMPARISON:  Earlier today FINDINGS: Relocated  glenohumeral joint. Hill-Sachs deformity. [facility] with minimal spurring. IMPRESSION: Relocated glenohumeral joint. Hill-Sachs deformity. Electronically Signed   By: Marnee SpringJonathon  Watts M.D.   On: 10/11/2019 04:42   Dg Shoulder Right  Result Date: 10/11/2019 CLINICAL DATA:  Shoulder deformity EXAM: RIGHT SHOULDER - 2+ VIEW COMPARISON:  01/11/2019 FINDINGS: AC joint is intact. Right lung apex is clear. Anterior dislocation of the right humeral head with respect to the glenoid fossa. No acute fracture identified IMPRESSION: Anterior dislocation of right humeral head with respect to the glenoid fossa Electronically Signed   By: Jasmine PangKim  Fujinaga M.D.   On: 10/11/2019 03:57     ____________________________________________   PROCEDURES  Procedure(s) performed:yes .Sedation  Date/Time: 10/11/2019 4:49 AM Performed by: Nita SickleVeronese, Camp, MD Authorized by: Nita SickleVeronese, [place], MD   Consent:    Consent obtained:  Written (electronic informed consent)   Consent given by:  Patient   Risks discussed:  Allergic reaction, dysrhythmia, inadequate sedation, nausea, vomiting, respiratory compromise necessitating ventilatory assistance and intubation, prolonged sedation necessitating reversal and prolonged hypoxia resulting in organ damage   Alternatives discussed:  Analgesia  without sedation Universal protocol:    Procedure explained and questions answered to patient or proxy's satisfaction: yes     Relevant documents present and verified: yes     Test results available and properly labeled: yes     Imaging studies available: yes     Required blood products, implants, devices, and special equipment available: yes     Immediately prior to procedure a time out was called: yes     Patient identity confirmation method:  Arm band Indications:    Procedure performed:  Dislocation reduction   Procedure necessitating sedation performed by:  Physician performing sedation Pre-sedation assessment:    Time  since last food or drink:  6   ASA classification: class 2 - patient with mild systemic disease     Neck mobility: normal     Mouth opening:  3 or more finger widths   Thyromental distance:  4 finger widths   Mallampati score:  I - soft palate, uvula, fauces, pillars visible   Pre-sedation assessments completed and reviewed: airway patency, cardiovascular function, hydration status, mental status, nausea/vomiting, pain level, respiratory function and temperature     Pre-sedation assessment completed:  10/11/2019 4:00 AM Immediate pre-procedure details:    Reassessment: Patient reassessed immediately prior to procedure     Reviewed: vital signs, relevant labs/tests and NPO status     Verified: bag valve mask available, emergency equipment available, intubation equipment available, IV patency confirmed, oxygen available, reversal medications available and suction available   Procedure details (see MAR for exact dosages):    Preoxygenation:  Nasal cannula   Sedation:  Etomidate   Intended level of sedation: deep   Analgesia:  Fentanyl   Intra-procedure monitoring:  Blood pressure monitoring, continuous pulse oximetry, cardiac monitor, frequent vital sign checks, frequent LOC assessments and continuous capnometry   Intra-procedure events: none     Total Provider sedation time (minutes):  9 Post-procedure details:    Post-sedation assessment completed:  10/11/2019 4:52 AM   Attendance: Constant attendance by certified staff until patient recovered     Recovery: Patient returned to pre-procedure baseline     Post-sedation assessments completed and reviewed: airway patency, cardiovascular function, hydration status, mental status, nausea/vomiting, pain level, respiratory function and temperature     Patient is stable for discharge or admission: yes     Patient tolerance:  Tolerated well, no immediate complications .Ortho Injury Treatment  Date/Time: 10/11/2019 4:52 AM Performed by: Rudene Re, MD Authorized by: Rudene Re, MD   Consent:    Consent obtained:  Written   Consent given by:  Patient   Risks discussed:  Fracture, irreducible dislocation, nerve damage, recurrent dislocation, restricted joint movement, stiffness and vascular damage   Alternatives discussed:  ReferralInjury location: shoulder Location details: right shoulder Injury type: dislocation Dislocation type: anterior Hill-Sachs deformity: yes Chronicity: new Pre-procedure neurovascular assessment: neurovascularly intact Pre-procedure distal perfusion: normal Pre-procedure neurological function: normal Pre-procedure range of motion: reduced  Anesthesia: Local anesthesia used: no  Patient sedated: Yes. Refer to sedation procedure documentation for details of sedation. Manipulation performed: yes Reduction method: external rotation Reduction successful: yes X-ray confirmed reduction: yes Immobilization: sling Post-procedure neurovascular assessment: post-procedure neurovascularly intact Post-procedure distal perfusion: normal Post-procedure neurological function: normal Post-procedure range of motion: improved Patient tolerance: patient tolerated the procedure well with no immediate complications    Critical Care performed:  None ____________________________________________   INITIAL IMPRESSION / ASSESSMENT AND PLAN / ED COURSE   58 y.o. male with a history of recurrent  right shoulder dislocations who presents for evaluation of right shoulder dislocation.  Attempt to reduce without conscious sedation unsuccessful.  Patient was sedated with etomidate with successful reduction per procedure note above.  Patient was put on a shoulder immobilizer.  Will discharge home with follow-up with orthopedics.       As part of my medical decision making, I reviewed the following data within the electronic MEDICAL RECORD NUMBER Nursing notes reviewed and incorporated, Old chart reviewed,  Radiograph reviewed , Notes from prior ED visits and [place] Controlled Substance Database   Patient was evaluated in Emergency Department today for the symptoms described in the history of present illness. Patient was evaluated in the context of the global COVID-19 pandemic, which necessitated consideration that the patient might be at risk for infection with the SARS-CoV-2 virus that causes COVID-19. Institutional protocols and algorithms that pertain to the evaluation of patients at risk for COVID-19 are in a state of rapid change based on information released by regulatory bodies including the CDC and federal and state organizations. These policies and algorithms were followed during the patient's care in the ED.   ____________________________________________   FINAL CLINICAL IMPRESSION(S) / ED DIAGNOSES   Final diagnoses:  Dislocation of right shoulder joint, initial encounter      NEW MEDICATIONS STARTED DURING THIS VISIT:  ED Discharge Orders    None       Note:  This document was prepared using Dragon voice recognition software and may include unintentional dictation errors.    Nita Sickle, MD 10/11/19 [phone]

## 2020-05-12 ENCOUNTER — Ambulatory Visit
Admission: EM | Admit: 2020-05-12 | Discharge: 2020-05-12 | Disposition: A | Discharge status: Home / Self Care | Payer: BC Managed Care – PPO | Attending: Family Medicine | Admitting: Family Medicine

## 2020-05-12 ENCOUNTER — Other Ambulatory Visit: Payer: Self-pay

## 2020-05-12 ENCOUNTER — Encounter: Payer: Self-pay | Admitting: Emergency Medicine

## 2020-05-12 DIAGNOSIS — M545 Low back pain, unspecified: Secondary | ICD-10-CM

## 2020-05-12 HISTORY — DX: Gastro-esophageal reflux disease without esophagitis: K21.9

## 2020-05-12 HISTORY — DX: Polyneuropathy, unspecified: G62.9

## 2020-05-12 HISTORY — DX: Sarcoidosis, unspecified: D86.9

## 2020-05-12 MED ORDER — CYCLOBENZAPRINE HCL 10 MG PO TABS: 10.0000 mg | ORAL_TABLET | Freq: Two times a day (BID) | ORAL | 0 refills | Status: DC | PRN

## 2020-05-12 MED ORDER — KETOROLAC TROMETHAMINE 60 MG/2ML IM SOLN
60.0000 mg | Freq: Once | INTRAMUSCULAR | Status: AC
  Administered 2020-05-12: 60 mg via INTRAMUSCULAR

## 2020-05-12 NOTE — ED Provider Notes (Signed)
[facility] ____________________________________________  Time seen: Approximately 11:33 AM  I have reviewed the triage vital signs and the nursing notes.   HISTORY  Chief Complaint Back Pain   HPI William Lynn is a 59 y.o. male presenting for evaluation of right lower back pain present since yesterday.  States he has been outside recently doing his yard work, denies any specific injury.  Reports yesterday morning when he woke up he had pain in his right lower back that has continued.  States pain improves with rest and in certain positions.  Pain worse with twisting and leaning on the area.  Did take a naproxen last night.  Denies other alleviating measures.  Denies pain radiation, paresthesias, fall, abdominal pain, dysuria, urinary bowel retention or incontinence, chest pain or shortness of breath or fevers.  Denies recurrent back pain.  States that he feels that this may be related to his mattress.  Reports otherwise doing well.  [facility], [facility] : PCP    Past Medical History:  Diagnosis Date  . GERD (gastroesophageal reflux disease)   . Neuropathy   . Sarcoidosis     Patient Active Problem List   Diagnosis Date Noted  . Myelitis (HCC) 06/21/2018  . On corticosteroid therapy 06/21/2018  . Sarcoidosis 06/17/2018  . Gait abnormality 02/25/2018  . Low serum thyroid stimulating hormone (TSH) 02/25/2018  . Serum gammaglobulin increased 02/25/2018  . Muscle weakness (generalized) 06/07/2014  . Closed fracture of glenoid cavity and neck of scapula 06/03/2014  . S/P arthroscopy of shoulder 06/03/2014  . Stiffness of joint, not elsewhere classified,  shoulder region 06/03/2014    Past Surgical History:  Procedure Laterality Date  . HERNIA REPAIR    . KNEE SURGERY Right   . SHOULDER SURGERY Bilateral      No current facility-administered medications for this encounter.  Current Outpatient Medications:  .  Cholecalciferol (VITAMIN D3) 75 MCG (3000 UT)  TABS, Take by mouth., Disp: , Rfl:  .  clobetasol ointment (TEMOVATE) 0.05 %, APPLY TOPICALLY TWO (2) TIMES A DAY. FOR RASHES, LARGE BSA, Disp: , Rfl:  .  DULoxetine (CYMBALTA) 30 MG capsule, Take 30 mg by mouth daily., Disp: , Rfl:  .  famotidine (PEPCID) 20 MG tablet, Take 20 mg by mouth 2 (two) times daily., Disp: , Rfl:  .  mycophenolate (CELLCEPT) 500 MG tablet, Take 1,000 mg by mouth 2 (two) times daily., Disp: , Rfl:  .  pregabalin (LYRICA) 75 MG capsule, Take 75 mg by mouth 2 (two) times daily., Disp: , Rfl:  .  sulfamethoxazole-trimethoprim (BACTRIM,SEPTRA) 400-80 MG tablet, Take by mouth., Disp: , Rfl:  .  cetirizine (ZYRTEC) 10 MG tablet, TAKE 1 TABLET BY MOUTH EVERY DAY EVERY NIGHT, Disp: , Rfl:  .  cyclobenzaprine (FLEXERIL) 10 MG tablet, Take 1 tablet (10 mg total) by mouth 2 (two) times daily as needed for muscle spasms. Do not drive while taking as can cause drowsiness, Disp: 20 tablet, Rfl: 0 .  diazepam (VALIUM) 10 MG tablet, TAKE 1 TABLET BY MOUTH ONCE FOR ANXIETY 30 MINUTES BEFORE MRI, AND 1 TAB AT THE TIME OF THE MRI, Disp: , Rfl:  .  loratadine (CLARITIN) 10 MG tablet, TAKE 10 MG BY MOUTH NIGHTLY. AS NEEDED FOR ITCHING, Disp: , Rfl:  .  methylPREDNISolone (MEDROL DOSEPAK) 4 MG TBPK tablet, as directed, Disp: , Rfl:  .  oxyCODONE (OXY IR/ROXICODONE) 5 MG immediate release tablet, , Disp: , Rfl:  .  pantoprazole (PROTONIX) 20 MG tablet, TAKE  1 TABLET (20 MG TOTAL) BY MOUTH DAILY. [place]., Disp: , Rfl:  .  pantoprazole (PROTONIX) 40 MG tablet, TAKE 1 TABLET (40 MG TOTAL) BY MOUTH DAILY. [place]. DURING STEROID TREATMENT., Disp: , Rfl:  .  predniSONE (DELTASONE) 20 MG tablet, TAKE 40 MG/DAY FOR A MONTH, UNTIL YOU SEE DERMATOLOGY., Disp: , Rfl:  .  prochlorperazine (COMPAZINE) 10 MG tablet, TAKE 1 TABLET BY MOUTH TWICE A DAY AS NEEDED NAUSEA, Disp: , Rfl:   Allergies Patient has no known allergies.  Family History  Problem Relation Age of Onset    . Other Mother        unknown medical history  . Other Father        unknown medical history    Social History Social History   Tobacco Use  . Smoking status: Former Smoker    Types: Cigarettes    Quit date: 05/12/1990    Years since quitting: 30.0  . Smokeless tobacco: Never Used  . Tobacco comment: 2-3 cigarette per day  Substance Use Topics  . Alcohol use: Never  . Drug use: Never    Review of Systems Constitutional: No fever Cardiovascular: Denies chest pain. Respiratory: Denies shortness of breath. Gastrointestinal: No abdominal pain.  Genitourinary: Negative for dysuria. Musculoskeletal: Positive for back pain. Skin: Negative for rash. Neurological: Negative for headaches, focal weakness or numbness.  ____________________________________________   PHYSICAL EXAM:  VITAL SIGNS: ED Triage Vitals  Enc Vitals Group     BP 05/12/20 1009 131/85     Pulse Rate 05/12/20 1009 86     Resp 05/12/20 1009 18     Temp 05/12/20 1009 97.9 F (36.6 C)     Temp Source 05/12/20 1009 Oral     SpO2 05/12/20 1009 99 %     Weight 05/12/20 1009 205 lb (93 kg)     Height 05/12/20 1009 6\' 5"  (1.956 m)     Head Circumference --      Peak Flow --      Pain Score 05/12/20 1008 8     Pain Loc --      Pain Edu? --      Excl. in [place]? --     Constitutional: Alert and oriented. Well appearing and in no acute distress. Eyes: Conjunctivae are normal.  ENT      Head: Normocephalic and atraumatic. Cardiovascular: Normal rate, regular rhythm. Grossly normal heart sounds.  Good peripheral circulation. Respiratory: Normal respiratory effort without tachypnea nor retractions.  Gastrointestinal: Soft and nontender.  No CVA tenderness. Musculoskeletal: No midline cervical, thoracic or lumbar tenderness to palpation. Bilateral pedal pulses equal. Except: No midline tenderness.  Right lower para lumbar and sciatic tenderness to direct palpation, no hip tenderness, no further surrounding  tenderness, pain increased with right and left lumbar rotation, no pain with lumbar flexion extension, no pain with bilateral standing knee lifts, plantar flexion dorsiflexion strong and equal, no saddle anesthesia, changes positions quickly. Neurologic:  Normal speech and language. No gross focal neurologic deficits are appreciated. Speech is normal. No gait instability.  Skin:  Skin is warm, dry and intact. No rash noted. Psychiatric: Mood and affect are normal. Speech and behavior are normal. Patient exhibits appropriate insight and judgment   ___________________________________________   LABS (all labs ordered are listed, but only abnormal results are displayed)  Labs Reviewed - No data to display   PROCEDURES Procedures   INITIAL IMPRESSION / ASSESSMENT AND PLAN / ED COURSE  Pertinent  labs & imaging results that were available during my care of the patient were reviewed by me and considered in my medical decision making (see chart for details).  Well-appearing patient.  Right lower back pain, suspect musculoskeletal.  No injury or trauma.  60 mg IM Toradol given in urgent care.  Patient has home naproxen and states he will take this and as needed Flexeril.  Encouraged rest, fluids, ice, heat, stretching and supportive care.Discussed indication, risks and benefits of medications with patient.  Discussed follow up with Primary care physician this week. Discussed follow up and return parameters including no resolution or any worsening concerns. Patient verbalized understanding and agreed to plan.   ____________________________________________   FINAL CLINICAL IMPRESSION(S) / ED DIAGNOSES  Final diagnoses:  Acute right-sided low back pain without sciatica     ED Discharge Orders         Ordered    cyclobenzaprine (FLEXERIL) 10 MG tablet  2 times daily PRN     05/12/20 1045           Note: This dictation was prepared with Dragon dictation along with smaller phrase  technology. Any transcriptional errors that result from this process are unintentional.         Renford Dills, NP 05/12/20 1150

## 2020-05-12 NOTE — Discharge Instructions (Addendum)
Take medication as prescribed.  Home naproxen.  Ice/heat.  Rest. Drink plenty of fluids.   Follow up with your primary care physician this week as needed. Return to Urgent care for new or worsening concerns.

## 2020-05-12 NOTE — ED Triage Notes (Signed)
Patient in today c/o right sided back pain since yesterday. Patient denies any injury. He states that he woke up with the pain yesterday. Patient not taken any OTC medications.

## 2020-07-15 ENCOUNTER — Emergency Department
Admission: EM | Admit: 2020-07-15 | Discharge: 2020-07-15 | Disposition: A | Discharge status: Home / Self Care | Payer: BC Managed Care – PPO | Attending: Emergency Medicine | Admitting: Emergency Medicine

## 2020-07-15 ENCOUNTER — Other Ambulatory Visit: Payer: Self-pay

## 2020-07-15 DIAGNOSIS — Z87891 Personal history of nicotine dependence: Secondary | ICD-10-CM | POA: Diagnosis not present

## 2020-07-15 DIAGNOSIS — R531 Weakness: Secondary | ICD-10-CM

## 2020-07-15 DIAGNOSIS — Z79899 Other long term (current) drug therapy: Secondary | ICD-10-CM | POA: Diagnosis not present

## 2020-07-15 DIAGNOSIS — E860 Dehydration: Secondary | ICD-10-CM | POA: Diagnosis not present

## 2020-07-15 LAB — URINALYSIS, COMPLETE (UACMP) WITH MICROSCOPIC
Bacteria, UA: NONE SEEN
Bilirubin Urine: NEGATIVE
Glucose, UA: NEGATIVE mg/dL
Hgb urine dipstick: NEGATIVE
Ketones, ur: NEGATIVE mg/dL
Leukocytes,Ua: NEGATIVE
Nitrite: NEGATIVE
Protein, ur: NEGATIVE mg/dL
Specific Gravity, Urine: 1.002 — ABNORMAL LOW (ref 1.005–1.030)
Squamous Epithelial / HPF: NONE SEEN (ref 0–5)
pH: 7 (ref 5.0–8.0)

## 2020-07-15 LAB — CBC
HCT: 47.6 % (ref 39.0–52.0)
Hemoglobin: 16 g/dL (ref 13.0–17.0)
MCH: 26.9 pg (ref 26.0–34.0)
MCHC: 33.6 g/dL (ref 30.0–36.0)
MCV: 80 fL (ref 80.0–100.0)
Platelets: 209 10*3/uL (ref 150–400)
RBC: 5.95 MIL/uL — ABNORMAL HIGH (ref 4.22–5.81)
RDW: 12.5 % (ref 11.5–15.5)
WBC: 6.2 10*3/uL (ref 4.0–10.5)
nRBC: 0 % (ref 0.0–0.2)

## 2020-07-15 LAB — BASIC METABOLIC PANEL
Anion gap: 10 (ref 5–15)
BUN: 16 mg/dL (ref 6–20)
CO2: 25 mmol/L (ref 22–32)
Calcium: 9.8 mg/dL (ref 8.9–10.3)
Chloride: 100 mmol/L (ref 98–111)
Creatinine, Ser: 1.53 mg/dL — ABNORMAL HIGH (ref 0.61–1.24)
GFR calc Af Amer: 58 mL/min — ABNORMAL LOW (ref >60)
GFR calc non Af Amer: 50 mL/min — ABNORMAL LOW (ref >60)
Glucose, Bld: 92 mg/dL (ref 70–99)
Potassium: 4.8 mmol/L (ref 3.5–5.1)
Sodium: 135 mmol/L (ref 135–145)

## 2020-07-15 MED ORDER — SODIUM CHLORIDE 0.9 % IV BOLUS
1000.0000 mL | Freq: Once | INTRAVENOUS | Status: AC
  Administered 2020-07-15: 1000 mL via INTRAVENOUS

## 2020-07-15 NOTE — Discharge Instructions (Addendum)
Drink at least 6-8 glasses of water daily for the next several days

## 2020-07-15 NOTE — ED Triage Notes (Signed)
Pt comes POV with dehydration and fatigue. Reports that he has been trying to drink enough water but still feels tired.

## 2020-07-15 NOTE — ED Provider Notes (Signed)
[facility] Emergency Department Provider Note  ____________________________________________   First MD Initiated Contact with Patient 07/15/20 1739     (approximate)  I have reviewed the triage vital signs and the nursing notes.   HISTORY  Chief Complaint Dehydration and Fatigue    HPI William Lynn is a 59 y.o. male with past medical history of sarcoidosis on CellCept here with generalized weakness.  The patient states that approximately 2 to 3 days ago, he developed several episodes of loose diarrhea.  He developed associated general fatigue and some lightheadedness with standing.  He also has had some slightly decreased appetite.  Since then, he has felt very fatigued and tired.  He has felt some lightheadedness with walking and moving.  No lightheadedness at rest.  No chest pain or shortness of breath.  No fevers or chills.  He does admit that he has been peeing slightly less than usual and has not been drinking as much fluid as he normally does.  No other acute complaints.  No recent medication changes.        Past Medical History:  Diagnosis Date  . GERD (gastroesophageal reflux disease)   . Neuropathy   . Sarcoidosis     Patient Active Problem List   Diagnosis Date Noted  . Myelitis (HCC) 06/21/2018  . On corticosteroid therapy 06/21/2018  . Sarcoidosis 06/17/2018  . Gait abnormality 02/25/2018  . Low serum thyroid stimulating hormone (TSH) 02/25/2018  . Serum gammaglobulin increased 02/25/2018  . Muscle weakness (generalized) 06/07/2014  . Closed fracture of glenoid cavity and neck of scapula 06/03/2014  . S/P arthroscopy of shoulder 06/03/2014  . Stiffness of joint, not elsewhere classified,  shoulder region 06/03/2014    Past Surgical History:  Procedure Laterality Date  . HERNIA REPAIR    . KNEE SURGERY Right   . SHOULDER SURGERY Bilateral     Prior to Admission medications   Medication Sig Start Date End Date Taking?  Authorizing Provider  cetirizine (ZYRTEC) 10 MG tablet TAKE 1 TABLET BY MOUTH EVERY DAY EVERY NIGHT 11/02/18   [provider]  Cholecalciferol (VITAMIN D3) 75 MCG (3000 UT) TABS Take by mouth.    [provider]  clobetasol ointment (TEMOVATE) 0.05 % APPLY TOPICALLY TWO (2) TIMES A DAY. FOR RASHES, LARGE BSA 01/13/19   [provider]  cyclobenzaprine (FLEXERIL) 10 MG tablet Take 1 tablet (10 mg total) by mouth 2 (two) times daily as needed for muscle spasms. Do not drive while taking as can cause drowsiness 05/12/20   Renford Dills, NP  diazepam (VALIUM) 10 MG tablet TAKE 1 TABLET BY MOUTH ONCE FOR ANXIETY 30 MINUTES BEFORE MRI, AND 1 TAB AT THE TIME OF THE MRI 07/10/18   [provider]  DULoxetine (CYMBALTA) 30 MG capsule Take 30 mg by mouth daily. 05/02/20   [provider]  famotidine (PEPCID) 20 MG tablet Take 20 mg by mouth 2 (two) times daily. 02/25/20   [provider]  loratadine (CLARITIN) 10 MG tablet TAKE 10 MG BY MOUTH NIGHTLY. AS NEEDED FOR ITCHING 01/12/19   [provider]  methylPREDNISolone (MEDROL DOSEPAK) 4 MG TBPK tablet as directed 06/11/18   [provider]  mycophenolate (CELLCEPT) 500 MG tablet Take 1,000 mg by mouth 2 (two) times daily. 04/19/20   [provider]  oxyCODONE (OXY IR/ROXICODONE) 5 MG immediate release tablet  10/04/10   [provider]  pantoprazole (PROTONIX) 20 MG tablet TAKE 1 TABLET (20 MG TOTAL) BY  MOUTH DAILY. 30 MIN BEFORE BREAKFAST. 11/03/18   [provider]  pantoprazole (PROTONIX) 40 MG tablet TAKE 1 TABLET (40 MG TOTAL) BY MOUTH DAILY. 30 MIN BEFORE BREAKFAST. DURING STEROID TREATMENT. 01/12/19   [provider]  predniSONE (DELTASONE) 20 MG tablet TAKE 40 MG/DAY FOR A MONTH, UNTIL YOU SEE DERMATOLOGY. 01/12/19   [provider]  pregabalin (LYRICA) 75 MG capsule Take 75 mg by mouth 2 (two) times daily. 04/17/20   [provider]    prochlorperazine (COMPAZINE) 10 MG tablet TAKE 1 TABLET BY MOUTH TWICE A DAY AS NEEDED NAUSEA 06/11/18   [provider]  sulfamethoxazole-trimethoprim (BACTRIM,SEPTRA) 400-80 MG tablet Take by mouth. 09/13/18   [provider]    Allergies Patient has no known allergies.  Family History  Problem Relation Age of Onset  . Other Mother        unknown medical history  . Other Father        unknown medical history    Social History Social History   Tobacco Use  . Smoking status: Former Smoker    Types: Cigarettes    Quit date: 05/12/1990    Years since quitting: 30.1  . Smokeless tobacco: Never Used  . Tobacco comment: 2-3 cigarette per day  Vaping Use  . Vaping Use: Never used  Substance Use Topics  . Alcohol use: Never  . Drug use: Never    Review of Systems  Review of Systems  Constitutional: Positive for fatigue. Negative for chills and fever.  HENT: Negative for sore throat.   Respiratory: Negative for shortness of breath.   Cardiovascular: Negative for chest pain.  Gastrointestinal: Negative for abdominal pain.  Genitourinary: Negative for flank pain.  Musculoskeletal: Negative for neck pain.  Skin: Negative for rash and wound.  Allergic/Immunologic: Negative for immunocompromised state.  Neurological: Positive for light-headedness. Negative for weakness and numbness.  Hematological: Does not bruise/bleed easily.  All other systems reviewed and are negative.    ____________________________________________  PHYSICAL EXAM:      VITAL SIGNS: ED Triage Vitals [07/15/20 1432]  Enc Vitals Group     BP (!) 137/98     Pulse Rate 71     Resp 18     Temp 98 F (36.7 C)     Temp Source Oral     SpO2 99 %     Weight 185 lb (83.9 kg)     Height 6\' 5"  (1.956 m)     Head Circumference      Peak Flow      Pain Score 0     Pain Loc      Pain Edu?      Excl. in [place]?      Physical Exam Vitals and nursing note reviewed.  Constitutional:       General: He is not in acute distress.    Appearance: He is well-developed.  HENT:     Head: Normocephalic and atraumatic.     Mouth/Throat:     Mouth: Mucous membranes are dry.  Eyes:     Conjunctiva/sclera: Conjunctivae normal.  Cardiovascular:     Rate and Rhythm: Normal rate and regular rhythm.     Heart sounds: Normal heart sounds. No murmur heard.  No friction rub.  Pulmonary:     Effort: Pulmonary effort is normal. No respiratory distress.     Breath sounds: Normal breath sounds. No wheezing or rales.  Abdominal:     General: There is no distension.  Palpations: Abdomen is soft.     Tenderness: There is no abdominal tenderness.  Musculoskeletal:     Cervical back: Neck supple.  Skin:    General: Skin is warm.     Capillary Refill: Capillary refill takes less than 2 seconds.  Neurological:     Mental Status: He is alert and oriented to person, place, and time.     Motor: No abnormal muscle tone.       ____________________________________________   LABS (all labs ordered are listed, but only abnormal results are displayed)  Labs Reviewed  BASIC METABOLIC PANEL - Abnormal; Notable for the following components:      Result Value   Creatinine, Ser 1.53 (*)    GFR calc non Af Amer 50 (*)    GFR calc Af Amer 58 (*)    All other components within normal limits  CBC - Abnormal; Notable for the following components:   RBC 5.95 (*)    All other components within normal limits  URINALYSIS, COMPLETE (UACMP) WITH MICROSCOPIC - Abnormal; Notable for the following components:   Color, Urine COLORLESS (*)    APPearance CLEAR (*)    Specific Gravity, Urine 1.002 (*)    All other components within normal limits  MYCOPHENOLIC ACID (CELLCEPT)  CBG MONITORING, ED    ____________________________________________  EKG:  ________________________________________  RADIOLOGY All imaging, including plain films, CT scans, and ultrasounds, independently reviewed by me, and  interpretations confirmed via formal radiology reads.  ED MD interpretation:     Official radiology report(s): No results found.  ____________________________________________  PROCEDURES   Procedure(s) performed (including Critical Care):  Procedures  ____________________________________________  INITIAL IMPRESSION / MDM / ASSESSMENT AND PLAN / ED COURSE  As part of my medical decision making, I reviewed the following data within the electronic MEDICAL RECORD NUMBER Nursing notes reviewed and incorporated, Old chart reviewed, Notes from prior ED visits, and [place] Controlled Substance Database       *Emileo Serena was evaluated in Emergency Department on 07/15/2020 for the symptoms described in the history of present illness. He was evaluated in the context of the global COVID-19 pandemic, which necessitated consideration that the patient might be at risk for infection with the SARS-CoV-2 virus that causes COVID-19. Institutional protocols and algorithms that pertain to the evaluation of patients at risk for COVID-19 are in a state of rapid change based on information released by regulatory bodies including the CDC and federal and state organizations. These policies and algorithms were followed during the patient's care in the ED.  Some ED evaluations and interventions may be delayed as a result of limited staffing during the pandemic.*     Medical Decision Making: 59 year old male here with generalized weakness and lightheadedness upon standing.  He has not been eating and drinking very much.  Suspect mild dehydration.  He has no chest pain, shortness of breath, or evidence of arrhythmia or cardiac etiology.  He does appear mildly dehydrated clinically and creatinine is slightly elevated.  Patient given fluids here.  Following this, he feels markedly improved and is tolerating p.o.  He is ambulatory without difficulty.  Suspect mild dehydration due to poor p.o. intake.  I sent off a CellCept  level for him.  He has no fever or signs of infection.  Will encourage p.o. hydration and outpatient follow-up with his PCP.  ____________________________________________  FINAL CLINICAL IMPRESSION(S) / ED DIAGNOSES  Final diagnoses:  Dehydration  Generalized weakness     MEDICATIONS GIVEN DURING THIS VISIT:  Medications  sodium chloride 0.9 % bolus 1,000 mL (0 mLs Intravenous Stopped 07/15/20 1928)     ED Discharge Orders    None       Note:  This document was prepared using Dragon voice recognition software and may include unintentional dictation errors.   Shaune Pollack, MD 07/15/20 2020

## 2020-07-15 NOTE — ED Notes (Addendum)
PT c/o dizziness and fatigue x 2 day. Pt states that he had diarrhea initially but none now, Pt states 2 episodes of diarrhea, no abd pain. Pt denies vomiting  Pt had negative COVID test yesterday

## 2020-07-18 LAB — MYCOPHENOLIC ACID (CELLCEPT)
MPA Glucuronide: 47 ug/mL (ref 15–125)
MPA: 5.9 ug/mL — ABNORMAL HIGH (ref 1.0–3.5)

## 2021-01-02 NOTE — Patient Instructions (Addendum)
Access Code: [email or web address] URL: [email or web address] Date: 01/03/2021 Prepared by: Ria Comment  Exercises Standing Tandem Balance with Counter Support - 1 x daily - 7 x weekly - 3 x 30s with each foot forward hold Tandem Walking Next to Counter - 1 x daily - 7 x weekly - 3 reps - 30s hold Standing Single Leg Stance with Counter Support - 1 x daily - 7 x weekly - 3 x 30s on each leg hold Sit to Stand without Arm Support - 1 x daily - 7 x weekly - 2 sets - 10 reps

## 2021-01-03 ENCOUNTER — Ambulatory Visit: Payer: Medicaid Other | Attending: Physical Medicine and Rehabilitation

## 2021-01-03 ENCOUNTER — Other Ambulatory Visit: Payer: Self-pay

## 2021-01-03 DIAGNOSIS — R262 Difficulty in walking, not elsewhere classified: Secondary | ICD-10-CM | POA: Diagnosis present

## 2021-01-03 DIAGNOSIS — R2681 Unsteadiness on feet: Secondary | ICD-10-CM | POA: Diagnosis present

## 2021-01-03 NOTE — Therapy (Addendum)
[facility] [address] [place], [place], [address] Phone: [phone]   Fax:  [phone]  Physical Therapy Evaluation  Patient Details  Name: William Lynn MRN: [id number] Date of Birth: 1961/02/16 Referring Provider (PT): Marshell Levan   Encounter Date: 01/03/2021   PT End of Session - 01/03/21 0803    Visit Number 1    Number of Visits 17    Date for PT Re-Evaluation 02/28/21    Authorization Type eval: 01/03/21    PT Start Time 0800    PT Stop Time 0845    PT Time Calculation (min) 45 min    Equipment Utilized During Treatment Gait belt    Activity Tolerance Patient tolerated treatment well    Behavior During Therapy WFL for tasks assessed/performed           Past Medical History:  Diagnosis Date   GERD (gastroesophageal reflux disease)    Neuropathy    Sarcoidosis     Past Surgical History:  Procedure Laterality Date   HERNIA REPAIR     KNEE SURGERY Right    SHOULDER SURGERY Bilateral     There were no vitals filed for this visit.    Subjective Assessment - 01/03/21 0800    Subjective Imbalance    Pertinent History Portions of history borrowed from EMR however all history was reviewed and confirmed with patient. Patient is a 60 y.o. year old male referred for mobility and balance deficits how has a diagnosis of neurosarcoidosis as well as myeltitis, for which he takes cellcept and prednisone. Pt denies any hopsital admissions related to these diagnoses. Major limitation is neuropathy which prevents him from working or driving extended distances. Neuropathy is in a stocking distribution that extends to the knees. He takes Lyrica and Cymbalta to help with this pain.  Recently had an MRI done that showed active spinal cord inflammation. Pt was very disappointed by these findings and had stopped taking all his medications however has restarted at this time. Intermittent use of small base quad cane around the  house and in the community. Was previously receiving [place] [facility] with last visit in September. He was working on balance exercises (tandem gait, gait with head turns, single leg balance) and LE strengthening (SLR, step ups). Pt was referred by physical medicine to a local counselor due to distress over his diagnoses and limitations and pt reports that it is going well. Pt was also referred by physical medicine to ENT for vertigo. He had a hearing test and was found to have bilateral hearing loss which pt also notices. Pt states that he has not had a VNG study. He is not having any more episodes of vertigo however as they have resolved. No falls in the last 6 months but pt reports that he has "come close." He has a shower chair which he uses when bathing. Pt has a treadmill, stationary bike, and weights at home but does not use exercise equipment at this time. Pt reports fluctuating energy levels with worsening energy throughout the day.    Limitations Walking    Diagnostic tests See history    Patient Stated Goals Improve balance, pt would like to be able to drive    Currently in Pain? Yes    Pain Score 8     Pain Location Foot    Pain Orientation Right;Left    Pain Descriptors / Indicators Burning    Pain Type Chronic pain    Pain Radiating Towards  To the knees    Pain Onset More than a month ago    Pain Frequency Constant    Aggravating Factors  Walking    Pain Relieving Factors Cymbalta and Lyrica              [facility] PT Assessment - 01/03/21 0801      Assessment   Medical Diagnosis Myelitis    Referring Provider (PT) Sena Hitch    Onset Date/Surgical Date 06/29/18   Approximate   Hand Dominance Right    Next MD Visit January 2022 with Dr. Janit Bern    Prior Therapy Yes, HHPT      Precautions   Precautions Fall      Restrictions   Weight Bearing Restrictions No      Balance Screen   Has the patient fallen in the past 6 months No    Has the patient had a decrease in activity  level because of a fear of falling?  Yes    Is the patient reluctant to leave their home because of a fear of falling?  Yes      Home Environment   Living Environment Private residence    Living Arrangements Spouse/significant other    Available Help at Discharge Family    Type of Home House    Home Access Stairs to enter    Entrance Stairs-Number of Steps 1    Entrance Stairs-Rails Right;Left;Cannot reach both    Home Layout Two level;Bed/bath upstairs    Alternate Level Stairs-Number of Steps 10+5, landing    Alternate Level Stairs-Rails Right    Home Equipment Cane - quad;Shower seat      Prior Function   Level of Independence Independent      Cognition   Overall Cognitive Status Within Functional Limits for tasks assessed      Standardized Balance Assessment   Standardized Balance Assessment Berg Balance Test;Dynamic Gait Index      Berg Balance Test   Sit to Stand Able to stand without using hands and stabilize independently    Standing Unsupported Able to stand safely 2 minutes    Sitting with Back Unsupported but Feet Supported on Floor or Stool Able to sit safely and securely 2 minutes    Stand to Sit Sits safely with minimal use of hands    Transfers Able to transfer safely, minor use of hands    Standing Unsupported with Eyes Closed Able to stand 10 seconds with supervision    Standing Unsupported with Feet Together Able to place feet together independently and stand 1 minute safely    From Standing, Reach Forward with Outstretched Arm Can reach forward >12 cm safely (5")    From Standing Position, Pick up Object from Floor Able to pick up shoe safely and easily    From Standing Position, Turn to Look Behind Over each Shoulder Looks behind from both sides and weight shifts well    Turn 360 Degrees Able to turn 360 degrees safely in 4 seconds or less    Standing Unsupported, Alternately Place Feet on Step/Stool Able to stand independently and safely and complete 8 steps  in 20 seconds    Standing Unsupported, One Foot in Front Able to plae foot ahead of the other independently and hold 30 seconds    Standing on One Leg Tries to lift leg/unable to hold 3 seconds but remains standing independently    Total Score 50      Dynamic Gait Index   Level Surface  Mild Impairment    Change in Gait Speed Normal    Gait with Horizontal Head Turns Mild Impairment    Gait with Vertical Head Turns Mild Impairment    Gait and Pivot Turn Normal    Step Over Obstacle Normal    Step Around Obstacles Normal    Steps Mild Impairment    Total Score 20              SUBJECTIVE Chief complaint: Onset: Portions of history borrowed from EMR however all history was reviewed and confirmed with patient. Patient is a 60 y.o. year old male referred for mobility and balance deficits how has a diagnosis of neurosarcoidosis as well as myeltitis, for which he takes cellcept and prednisone. Pt denies any hopsital admissions related to these diagnoses. Major limitation is neuropathy which prevents him from working or driving extended distances. Neuropathy is in a stocking distribution that extends to the knees. He takes Lyrica and Cymbalta to help with this pain.  Recently had an MRI done that showed active spinal cord inflammation. Pt was very disappointed by these findings and had stopped taking all his medications however has restarted at this time. Intermittent use of small base quad cane around the house and in the community. Was previously receiving HH PT with last visit in September. He was working on balance exercises (tandem gait, gait with head turns, single leg balance) and LE strengthening (SLR, step ups). Pt was referred by physical medicine to a local counselor due to distress over his diagnoses and limitations and pt reports that it is going well. Pt was also referred by physical medicine to ENT for vertigo. He had a hearing test and was found to have bilateral hearing loss which pt  also notices. Pt states that he has not had a VNG study. He is not having any more episodes of vertigo however as they have resolved. No falls in the last 6 months but pt reports that he has "come close." He has a shower chair which he uses when bathing. Pt has a treadmill, stationary bike, and weights at home but does not use exercise equipment at this time. Pt reports fluctuating energy levels with worsening energy throughout the day.  Recent changes in overall health/medication: No Directional pattern for falls: To the right Prior history of physical therapy for balance: Yes, HH PT Follow-up appointment with MD: January 2022 Red flags (bowel/bladder changes, saddle paresthesia, personal history of cancer, chills/fever, night sweats, unrelenting pain) Negative   OBJECTIVE  MUSCULOSKELETAL: Tremor: Absent Bulk: Normal Tone: Normal, no clonus  Posture No gross abnormalities noted in standing or seated posture  Gait Mild decrease in gait speed with slighter wider base of support and decreased step length  Strength R/L 5/5 Hip flexion 4+/4+ Hip external rotation 5/5 Hip internal rotation 5/5 Knee extension 5/5 Knee flexion 5/5 Ankle Dorsiflexion  5/5 Shoulder flexion  5/5 Shoulder abduction 5/5 Elbow flexion 5/5 Elbow extension  5/5 Wrist Extension 5/5 Wrist Flexion 5/5 Finger abduction   NEUROLOGICAL:  Mental Status Patient is oriented to person, place and time.  Recent memory is intact.  Remote memory is intact.  Attention span and concentration are intact.  Expressive speech is intact.  Patient's fund of knowledge is within normal limits for educational level.  Cranial Nerves Visual fields are intact, pt reports history of L burry vision. Wears corrective lenses. Wears special glasses for driving at night due to glare impacting vision Extraocular muscles are intact  Facial sensation is  intact bilaterally  Facial strength is intact bilaterally  Hearing is normal  as tested by gross conversation however pt reports hearing loss bilaterally as tested by audiometry Palate elevates midline, normal phonation  Shoulder shrug strength is intact  Tongue protrudes midline   Sensation Pt reports stocking distribution neuropathy from feet to knees. Not tested during evaluation today. Proprioception and hot/cold testing deferred on this date  Reflexes Deferred  Coordination/Cerebellar Finger to Nose: WNL Heel to Shin: WNL Rapid alternating movements: WNL Finger Opposition: WNL   FUNCTIONAL OUTCOME MEASURES   Results Comments  BERG 50/56 Fall risk, in need of intervention  DGI 20/24 Fall risk, in need of intervention  FOTO 37 Predicted improvement to 52  TUG 9.3 seconds WNL  5TSTS 12.6 seconds WNL  6 Minute Walk Test NT   10 Meter Gait Speed NT   ABC Scale 41.875% Low balance confidence    POSTURAL CONTROL TESTS   Modified Clinical Test of Sensory Interaction for Balance    (CTSIB): Deferred   .            Objective measurements completed on examination: See above findings.               PT Education - 01/03/21 0803    Education Details Plan of care and HEP    Person(s) Educated Patient    Methods Explanation;Handout    Comprehension Verbalized understanding            PT Short Term Goals - 01/03/21 1312      PT SHORT TERM GOAL #1   Title Pt will be independent with HEP in order to improve strength and balance in order to decrease fall risk and improve function at home.    Time 4    Period Weeks    Status New    Target Date 01/31/21             PT Long Term Goals - 01/03/21 1313      PT LONG TERM GOAL #1   Title Pt will improve ABC by at least 13% in order to demonstrate clinically significant improvement in balance confidence    Baseline 01/03/21: 41.875%    Time 8    Period Weeks    Status New    Target Date 02/28/21      PT LONG TERM GOAL #2   Title Pt will improve BERG by at least 3 points in  order to demonstrate clinically significant improvement in balance.    Baseline 01/03/21: 50/56    Time 8    Period Weeks    Status New    Target Date 02/28/21      PT LONG TERM GOAL #3   Title Pt will improve FOTO to at least 52 in order to demonstrate significant improvement in function    Baseline 01/03/21: 37    Time 8    Period Weeks    Status New    Target Date 02/28/21                  Plan - 01/03/21 0803    Clinical Impression Statement Pt is a pleasant 60 year-old male referred for difficulty with balance related to neurosarcoidosis and myelitis. PT examination reveals mild deficits in balance as indicated by BERG of 50/56 and DGI of 20/24. FOTO score of 37 with predicted improvement to 52. No focal weakness identified however pt does report fatigue with repeated sit to stand. Low balance confidence with pt scoring  41.875% on ABC. Pt presents with deficits in endurance, gait and balance and will benefit from skilled PT services to address these deficits to improve function at home and decrease risk for future falls.    Personal Factors and Comorbidities Comorbidity 3+;Time since onset of injury/illness/exacerbation    Comorbidities myelitis, s/p shoulder arthroscopic surgery, sarcoidosis, neuropathy    Examination-Activity Limitations Transfers    Examination-Participation Restrictions Community Activity;Driving;Shop;Yard Work    Stability/Clinical Decision Making Evolving/Moderate complexity    Clinical Decision Making Moderate    Rehab Potential Good    PT Frequency 2x / week    PT Duration 8 weeks    PT Treatment/Interventions ADLs/Self Care Home Management;Aquatic Therapy;Biofeedback;Canalith Repostioning;Cryotherapy;Electrical Stimulation;Iontophoresis 4mg /ml Dexamethasone;Moist Heat;Traction;Ultrasound;DME Instruction;Gait training;Stair training;Functional mobility training;Therapeutic activities;Therapeutic exercise;Balance training;Neuromuscular  re-education;Cognitive remediation;Patient/family education;Manual techniques;Passive range of motion;Dry needling;Vestibular;Joint Manipulations    PT Next Visit Plan mCTSIB, review HEP, balance and strengthening    PT Home Exercise Plan Access Code: [email or web address]    Consulted and Agree with Plan of Care Patient           Patient will benefit from skilled therapeutic intervention in order to improve the following deficits and impairments:  Decreased balance,Impaired sensation,Decreased endurance  Visit Diagnosis: Unsteadiness on feet - Plan: PT plan of care cert/re-cert  Difficulty in walking, not elsewhere classified - Plan: PT plan of care cert/re-cert     Problem List Patient Active Problem List   Diagnosis Date Noted   Myelitis (HCC) 06/21/2018   On corticosteroid therapy 06/21/2018   Sarcoidosis 06/17/2018   Gait abnormality 02/25/2018   Low serum thyroid stimulating hormone (TSH) 02/25/2018   Serum gammaglobulin increased 02/25/2018   Muscle weakness (generalized) 06/07/2014   Closed fracture of glenoid cavity and neck of scapula 06/03/2014   S/P arthroscopy of shoulder 06/03/2014   Stiffness of joint, not elsewhere classified,  shoulder region 06/03/2014   08/03/2014 William Lynn PT, DPT, GCS  William Lynn 01/03/2021, 1:26 PM  [facility] [address] [place], [place], [place] Phone: [phone]   Fax:  [phone]  Name: Darry Kelnhofer MRN: Zadie Cleverly Date of Birth: Mar 04, 1961

## 2021-01-09 ENCOUNTER — Ambulatory Visit: Payer: Medicaid Other

## 2021-01-09 ENCOUNTER — Other Ambulatory Visit: Payer: Self-pay

## 2021-01-09 VITALS — BP 124/87 | HR 80

## 2021-01-09 DIAGNOSIS — R2681 Unsteadiness on feet: Secondary | ICD-10-CM | POA: Diagnosis not present

## 2021-01-09 DIAGNOSIS — R262 Difficulty in walking, not elsewhere classified: Secondary | ICD-10-CM

## 2021-01-09 NOTE — Therapy (Signed)
[facility] [address] [place], [place], [address] Phone: [phone]   Fax:  [phone]  Physical Therapy Treatment  Patient Details  Name: William Lynn MRN: [id number] Date of Birth: [birth year] Referring Provider (PT): Sena Hitch   Encounter Date: 01/09/2021   PT End of Session - 01/09/21 0855    Visit Number 2    Number of Visits 17    Date for PT Re-Evaluation 02/28/21    Authorization Type eval: 01/03/21    PT Start Time 0845    PT Stop Time 0930    PT Time Calculation (min) 45 min    Equipment Utilized During Treatment Gait belt    Activity Tolerance Patient tolerated treatment well    Behavior During Therapy [facility] for tasks assessed/performed           Past Medical History:  Diagnosis Date  . GERD (gastroesophageal reflux disease)   . Neuropathy   . Sarcoidosis     Past Surgical History:  Procedure Laterality Date  . HERNIA REPAIR    . KNEE SURGERY Right   . SHOULDER SURGERY Bilateral     Vitals:   01/09/21 0848  BP: 124/87  Pulse: 80  SpO2: 97%     Subjective Assessment - 01/09/21 0846    Subjective Pt reports that he is doing well today. No changes since the initial evaluation. No pain reported today. No specific questions or concerns upon arrival.    Pertinent History Portions of history borrowed from EMR however all history was reviewed and confirmed with patient. Patient is a 60 y.o. year old male referred for mobility and balance deficits how has a diagnosis of neurosarcoidosis as well as myeltitis, for which he takes cellcept and prednisone. Pt denies any hopsital admissions related to these diagnoses. Major limitation is neuropathy which prevents him from working or driving extended distances. Neuropathy is in a stocking distribution that extends to the knees. He takes Lyrica and Cymbalta to help with this pain.  Recently had an MRI done that showed active spinal cord inflammation. Pt was very  disappointed by these findings and had stopped taking all his medications however has restarted at this time. Intermittent use of small base quad cane around the house and in the community. Was previously receiving HH PT with last visit in September. He was working on balance exercises (tandem gait, gait with head turns, single leg balance) and LE strengthening (SLR, step ups). Pt was referred by physical medicine to a local counselor due to distress over his diagnoses and limitations and pt reports that it is going well. Pt was also referred by physical medicine to ENT for vertigo. He had a hearing test and was found to have bilateral hearing loss which pt also notices. Pt states that he has not had a VNG study. He is not having any more episodes of vertigo however as they have resolved. No falls in the last 6 months but pt reports that he has "come close." He has a shower chair which he uses when bathing. Pt has a treadmill, stationary bike, and weights at home but does not use exercise equipment at this time. Pt reports fluctuating energy levels with worsening energy throughout the day.    Limitations Walking    Diagnostic tests See history    Patient Stated Goals Improve balance, pt would like to be able to drive    Currently in Pain? No/denies    Pain Onset --  [facility] PT Assessment - 01/09/21 0901      6 Minute walk- Post Test   6 Minute Walk Post Test yes    BP (mmHg) 144/85    HR (bpm) 88    02 Sat (%RA) 100 %    Modified Borg Scale for Dyspnea 5- Strong or hard breathing    Perceived Rate of Exertion (Borg) 13- Somewhat hard      6 minute walk test results    Aerobic Endurance Distance Walked 1488    Endurance additional comments No assistive device, speed decreases toward end of distance            TREATMENT   Neuromuscular Re-education    Modified Clinical Test of Sensory Interaction for Balance    (CTSIB):  CONDITION TIME SWAY  Eyes open, firm surface 30  seconds 1+  Eyes closed, firm surface 30 seconds 3+  Eyes open, foam surface 30 seconds 2+  Eyes closed, foam surface 3 seconds 4+    : 1488', no assistive device  Tandem balance alternating forward LE x 30s each; Tandem gait in // bars 8' x 4 each; Alternating 6" step taps without UE support x 10 each; Airex alternating 6" step taps without UE support x 10 each;   Ther-ex  Sit to stand without UE support 2 x 10;  Standing exercises with 2# ankle weights: Marching x 10 BLE; Hip abduction x 10 BLE; Hip extension x 10 BLE; HS curls x 10 BLE;   Pt educated throughout session about proper posture and technique with exercises. Improved exercise technique, movement at target joints, use of target muscles after min to mod verbal, visual, tactile cues.    Pt demonstrates excellent motivation during session today. Completed additional outcome measure the patient's including mCTSIB and .  Patient demonstrates severe imbalance during condition four of mCTSIB with eyes closed on foam. is WNL for community ambulation however fatigue noted. Reviewed HEP with patient and initiated balance and strength activities during session. Pt encouraged to continue HEP and follow-up as scheduled. Pt will benefit from PT services to address deficits in strength, balance, and mobility in order to return to full function at home.                    PT Short Term Goals - 01/03/21 1312      PT SHORT TERM GOAL #1   Title Pt will be independent with HEP in order to improve strength and balance in order to decrease fall risk and improve function at home.    Time 4    Period Weeks    Status New    Target Date 01/31/21             PT Long Term Goals - 01/03/21 1313      PT LONG TERM GOAL #1   Title Pt will improve ABC by at least 13% in order to demonstrate clinically significant improvement in balance confidence    Baseline 01/03/21: 41.875%    Time 8    Period Weeks    Status  New    Target Date 02/28/21      PT LONG TERM GOAL #2   Title Pt will improve BERG by at least 3 points in order to demonstrate clinically significant improvement in balance.    Baseline 01/03/21: 50/56    Time 8    Period Weeks    Status New    Target Date 02/28/21  PT LONG TERM GOAL #3   Title Pt will improve FOTO to at least 52 in order to demonstrate significant improvement in function    Baseline 01/03/21: 37    Time 8    Period Weeks    Status New    Target Date 02/28/21                 Plan - 01/09/21 0855    Clinical Impression Statement Pt demonstrates excellent motivation during session today. Completed additional outcome measure the patient's including mCTSIB and .  Patient demonstrates severe imbalance during condition four of mCTSIB with eyes closed on foam. is WNL for community ambulation however fatigue noted. Reviewed HEP with patient and initiated balance and strength activities during session. Pt encouraged to continue HEP and follow-up as scheduled. Pt will benefit from PT services to address deficits in strength, balance, and mobility in order to return to full function at home.    Personal Factors and Comorbidities Comorbidity 3+;Time since onset of injury/illness/exacerbation    Comorbidities myelitis, s/p shoulder arthroscopic surgery, sarcoidosis, neuropathy    Examination-Activity Limitations Transfers    Examination-Participation Restrictions Community Activity;Driving;Shop;Yard Work    Stability/Clinical Decision Making Evolving/Moderate complexity    Rehab Potential Good    PT Frequency 2x / week    PT Duration 8 weeks    PT Treatment/Interventions ADLs/Self Care Home Management;Aquatic Therapy;Biofeedback;Canalith Repostioning;Cryotherapy;Electrical Stimulation;Iontophoresis 4mg /ml Dexamethasone;Moist Heat;Traction;Ultrasound;DME Instruction;Gait training;Stair training;Functional mobility training;Therapeutic activities;Therapeutic  exercise;Balance training;Neuromuscular re-education;Cognitive remediation;Patient/family education;Manual techniques;Passive range of motion;Dry needling;Vestibular;Joint Manipulations    PT Next Visit Plan mCTSIB, review HEP, balance and strengthening    PT Home Exercise Plan Access Code: [email or web address]    Consulted and Agree with Plan of Care Patient           Patient will benefit from skilled therapeutic intervention in order to improve the following deficits and impairments:  Decreased balance,Impaired sensation,Decreased endurance  Visit Diagnosis: Unsteadiness on feet  Difficulty in walking, not elsewhere classified     Problem List Patient Active Problem List   Diagnosis Date Noted  . Myelitis (HCC) 06/21/2018  . On corticosteroid therapy 06/21/2018  . Sarcoidosis 06/17/2018  . Gait abnormality 02/25/2018  . Low serum thyroid stimulating hormone (TSH) 02/25/2018  . Serum gammaglobulin increased 02/25/2018  . Muscle weakness (generalized) 06/07/2014  . Closed fracture of glenoid cavity and neck of scapula 06/03/2014  . S/P arthroscopy of shoulder 06/03/2014  . Stiffness of joint, not elsewhere classified,  shoulder region 06/03/2014   08/03/2014 PT, DPT, GCS  Padraic Marinos 01/09/2021, 12:48 PM  [facility] [address] [place], [place], [place] Phone: [phone]   Fax:  [phone]  Name: Kaci Freel MRN: Zadie Cleverly Date of Birth: 19-Sep-1961

## 2021-01-11 ENCOUNTER — Other Ambulatory Visit: Payer: Self-pay

## 2021-01-11 ENCOUNTER — Ambulatory Visit: Payer: Medicaid Other

## 2021-01-11 DIAGNOSIS — R2681 Unsteadiness on feet: Secondary | ICD-10-CM | POA: Diagnosis not present

## 2021-01-11 DIAGNOSIS — R262 Difficulty in walking, not elsewhere classified: Secondary | ICD-10-CM

## 2021-01-11 NOTE — Therapy (Signed)
[facility] [address] [place], [place], [address] Phone: [phone]   Fax:  [phone]  Physical Therapy Treatment  Patient Details  Name: William Lynn MRN: [id number] Date of Birth: 1961/04/27 Referring Provider (PT): Sena Hitch   Encounter Date: 01/11/2021   PT End of Session - 01/11/21 0851    Visit Number 3    Number of Visits 17    Date for PT Re-Evaluation 02/28/21    Authorization Type eval: 01/03/21    PT Start Time 0845    PT Stop Time 0930    PT Time Calculation (min) 45 min    Equipment Utilized During Treatment Gait belt    Activity Tolerance Patient tolerated treatment well    Behavior During Therapy [facility] for tasks assessed/performed           Past Medical History:  Diagnosis Date  . GERD (gastroesophageal reflux disease)   . Neuropathy   . Sarcoidosis     Past Surgical History:  Procedure Laterality Date  . HERNIA REPAIR    . KNEE SURGERY Right   . SHOULDER SURGERY Bilateral     There were no vitals filed for this visit.   Subjective Assessment - 01/11/21 0849    Subjective Pt reports that he is doing well today. He states that after the last therapy session he had cramps in his legs with difficulty standing. He reports that he had difficulty walking and had one stumble but did not fall to the ground. No pain reported today. No specific questions or concerns upon arrival.    Pertinent History Portions of history borrowed from EMR however all history was reviewed and confirmed with patient. Patient is a 60 y.o. year old male referred for mobility and balance deficits how has a diagnosis of neurosarcoidosis as well as myeltitis, for which he takes cellcept and prednisone. Pt denies any hopsital admissions related to these diagnoses. Major limitation is neuropathy which prevents him from working or driving extended distances. Neuropathy is in a stocking distribution that extends to the knees. He  takes Lyrica and Cymbalta to help with this pain.  Recently had an MRI done that showed active spinal cord inflammation. Pt was very disappointed by these findings and had stopped taking all his medications however has restarted at this time. Intermittent use of small base quad cane around the house and in the community. Was previously receiving HH PT with last visit in September. He was working on balance exercises (tandem gait, gait with head turns, single leg balance) and LE strengthening (SLR, step ups). Pt was referred by physical medicine to a local counselor due to distress over his diagnoses and limitations and pt reports that it is going well. Pt was also referred by physical medicine to ENT for vertigo. He had a hearing test and was found to have bilateral hearing loss which pt also notices. Pt states that he has not had a VNG study. He is not having any more episodes of vertigo however as they have resolved. No falls in the last 6 months but pt reports that he has "come close." He has a shower chair which he uses when bathing. Pt has a treadmill, stationary bike, and weights at home but does not use exercise equipment at this time. Pt reports fluctuating energy levels with worsening energy throughout the day.    Limitations Walking    Diagnostic tests See history    Patient Stated Goals Improve balance, pt would like  to be able to drive    Currently in Pain? No/denies               TREATMENT   Neuromuscular Re-education  Modified tandem balance alternating forward LE x 30s each; Tandem balance alternating forward LE x 30s each; Tandem gait in // bars 8' x 4 lengths; Alternating 6" step taps without UE support x 10 each; Staggered stance balance with front foot on 6" step and rear foot on floor alternating LE x 30s each;   Ther-ex  Total Gym L22 squats 2 x 10; Total Gym L22 BLE heel raises 2 x 10;  Standing exercises with 2# ankle weights: Marching x 10 BLE; Hip abduction x  10 BLE; Hip extension x 10 BLE; HS curls x 10 BLE;  LAQ 2# 2 x 10 BLE; 6" alternating step-ups without UE support alternating LE x 10 each;   Pt educated throughout session about proper posture and technique with exercises. Improved exercise technique, movement at target joints, use of target muscles after min to mod verbal, visual, tactile cues.    Pt demonstrates excellent motivation during session today.  Continued with balance and strength exercises today but with seated rest breaks.  Patient does demonstrate some fatigue during session.  Tempered exercises today in order to minimize fatigue and soreness following therapy session.  Pt encouraged to continue HEP and follow-up as scheduled.   Will continue to modify therapy sessions to find appropriate level of intensity in order to maximize benefit and minimize fatigue.  Pt will benefit from PT services to address deficits in strength, balance, and mobility in order to return to full function at home.                          PT Short Term Goals - 01/03/21 1312      PT SHORT TERM GOAL #1   Title Pt will be independent with HEP in order to improve strength and balance in order to decrease fall risk and improve function at home.    Time 4    Period Weeks    Status New    Target Date 01/31/21             PT Long Term Goals - 01/03/21 1313      PT LONG TERM GOAL #1   Title Pt will improve ABC by at least 13% in order to demonstrate clinically significant improvement in balance confidence    Baseline 01/03/21: 41.875%    Time 8    Period Weeks    Status New    Target Date 02/28/21      PT LONG TERM GOAL #2   Title Pt will improve BERG by at least 3 points in order to demonstrate clinically significant improvement in balance.    Baseline 01/03/21: 50/56    Time 8    Period Weeks    Status New    Target Date 02/28/21      PT LONG TERM GOAL #3   Title Pt will improve FOTO to at least 52 in order to  demonstrate significant improvement in function    Baseline 01/03/21: 37    Time 8    Period Weeks    Status New    Target Date 02/28/21                 Plan - 01/11/21 0851    Clinical Impression Statement Pt demonstrates excellent motivation during session today.  Continued with balance and strength exercises today but with seated rest breaks.  Patient does demonstrate some fatigue during session.  Tempered exercises today in order to minimize fatigue and soreness following therapy session.  Pt encouraged to continue HEP and follow-up as scheduled.   Will continue to modify therapy sessions to find appropriate level of intensity in order to maximize benefit and minimize fatigue.  Pt will benefit from PT services to address deficits in strength, balance, and mobility in order to return to full function at home.    Personal Factors and Comorbidities Comorbidity 3+;Time since onset of injury/illness/exacerbation    Comorbidities myelitis, s/p shoulder arthroscopic surgery, sarcoidosis, neuropathy    Examination-Activity Limitations Transfers    Examination-Participation Restrictions Community Activity;Driving;Shop;Yard Work    Stability/Clinical Decision Making Evolving/Moderate complexity    Rehab Potential Good    PT Frequency 2x / week    PT Duration 8 weeks    PT Treatment/Interventions ADLs/Self Care Home Management;Aquatic Therapy;Biofeedback;Canalith Repostioning;Cryotherapy;Electrical Stimulation;Iontophoresis 4mg /ml Dexamethasone;Moist Heat;Traction;Ultrasound;DME Instruction;Gait training;Stair training;Functional mobility training;Therapeutic activities;Therapeutic exercise;Balance training;Neuromuscular re-education;Cognitive remediation;Patient/family education;Manual techniques;Passive range of motion;Dry needling;Vestibular;Joint Manipulations    PT Next Visit Plan review HEP, balance and strengthening    PT Home Exercise Plan Access Code: [email or web address]    Consulted and Agree with  Plan of Care Patient           Patient will benefit from skilled therapeutic intervention in order to improve the following deficits and impairments:  Decreased balance,Impaired sensation,Decreased endurance  Visit Diagnosis: Unsteadiness on feet  Difficulty in walking, not elsewhere classified     Problem List Patient Active Problem List   Diagnosis Date Noted  . Myelitis (HCC) 06/21/2018  . On corticosteroid therapy 06/21/2018  . Sarcoidosis 06/17/2018  . Gait abnormality 02/25/2018  . Low serum thyroid stimulating hormone (TSH) 02/25/2018  . Serum gammaglobulin increased 02/25/2018  . Muscle weakness (generalized) 06/07/2014  . Closed fracture of glenoid cavity and neck of scapula 06/03/2014  . S/P arthroscopy of shoulder 06/03/2014  . Stiffness of joint, not elsewhere classified,  shoulder region 06/03/2014   08/03/2014 PT, DPT, GCS  Dhiya Smits 01/11/2021, 1:12 PM  [facility] [address] [place], [place], [place] Phone: [phone]   Fax:  [phone]  Name: William Lynn MRN: Zadie Cleverly Date of Birth: [birth year]

## 2021-01-16 ENCOUNTER — Ambulatory Visit: Payer: Medicaid Other

## 2021-01-17 NOTE — Patient Instructions (Incomplete)
TREATMENT   Neuromuscular Re-education  Modified tandem balance alternating forward LE x 30s each; Tandem balance alternating forward LE x 30s each; Tandem gait in // bars 8' x 4 lengths; Alternating 6" step taps without UE support x 10 each; Staggered stance balance with front foot on 6" step and rear foot on floor alternating LE x 30s each;   Ther-ex Total Gym L22 squats 2 x 10; Total Gym L22 BLE heel raises 2 x 10;  Standing exercises with2# ankle weights: Marching x 10 BLE; Hip abduction x 10 BLE; Hip extension x 10 BLE; HS curls x 10 BLE;  LAQ 2# 2 x 10 BLE; 6" alternating step-ups without UE support alternating LE x 10 each;   Pt educated throughout session about proper posture and technique with exercises. Improved exercise technique, movement at target joints, use of target muscles after min to mod verbal, visual, tactile cues.   Pt demonstrates excellent motivation during session today.  Continued with balance and strength exercises today but with seated rest breaks.  Patient does demonstrate some fatigue during session.  Tempered exercises today in order to minimize fatigue and soreness following therapy session.  Pt encouraged to continue HEP and follow-up as scheduled.  Will continue to modify therapy sessions to find appropriate level of intensity in order to maximize benefit and minimize fatigue.  Pt will benefit from PT services to address deficits in strength, balance, and mobility in order to return to full function at home.

## 2021-01-18 ENCOUNTER — Ambulatory Visit: Payer: Medicaid Other

## 2021-01-18 ENCOUNTER — Other Ambulatory Visit: Payer: Self-pay

## 2021-01-18 DIAGNOSIS — R262 Difficulty in walking, not elsewhere classified: Secondary | ICD-10-CM

## 2021-01-18 DIAGNOSIS — R2681 Unsteadiness on feet: Secondary | ICD-10-CM

## 2021-01-18 NOTE — Therapy (Signed)
[place] [facility] [address] [place], [place], [address] Phone: [phone]   Fax:  [phone]  Physical Therapy Treatment  Patient Details  Name: William Lynn MRN: [id number] Date of Birth: 11/16/1961 Referring Provider (PT): Sena Hitch   Encounter Date: 01/18/2021   PT End of Session - 01/18/21 0852    Visit Number 4    Number of Visits 17    Date for PT Re-Evaluation 02/28/21    Authorization Type eval: 01/03/21    PT Start Time 0845    PT Stop Time 0930    PT Time Calculation (min) 45 min    Equipment Utilized During Treatment Gait belt    Activity Tolerance Patient tolerated treatment well    Behavior During Therapy [facility] for tasks assessed/performed           Past Medical History:  Diagnosis Date  . GERD (gastroesophageal reflux disease)   . Neuropathy   . Sarcoidosis     Past Surgical History:  Procedure Laterality Date  . HERNIA REPAIR    . KNEE SURGERY Right   . SHOULDER SURGERY Bilateral     There were no vitals filed for this visit.   Subjective Assessment - 01/18/21 0850    Subjective Pt reports that he is doing alright today.  He is having neuropathic burning pain in BLE today.  Any excessive soreness after last therapy session for muscle spasm/cramps.  No specific questions or concerns upon arrival.    Pertinent History Portions of history borrowed from EMR however all history was reviewed and confirmed with patient. Patient is a 60 y.o. year old male referred for mobility and balance deficits how has a diagnosis of neurosarcoidosis as well as myeltitis, for which he takes cellcept and prednisone. Pt denies any hopsital admissions related to these diagnoses. Major limitation is neuropathy which prevents him from working or driving extended distances. Neuropathy is in a stocking distribution that extends to the knees. He takes Lyrica and Cymbalta to help with this pain.  Recently had an MRI done that  showed active spinal cord inflammation. Pt was very disappointed by these findings and had stopped taking all his medications however has restarted at this time. Intermittent use of small base quad cane around the house and in the community. Was previously receiving HH PT with last visit in September. He was working on balance exercises (tandem gait, gait with head turns, single leg balance) and LE strengthening (SLR, step ups). Pt was referred by physical medicine to a local counselor due to distress over his diagnoses and limitations and pt reports that it is going well. Pt was also referred by physical medicine to ENT for vertigo. He had a hearing test and was found to have bilateral hearing loss which pt also notices. Pt states that he has not had a VNG study. He is not having any more episodes of vertigo however as they have resolved. No falls in the last 6 months but pt reports that he has "come close." He has a shower chair which he uses when bathing. Pt has a treadmill, stationary bike, and weights at home but does not use exercise equipment at this time. Pt reports fluctuating energy levels with worsening energy throughout the day.    Limitations Walking    Diagnostic tests See history    Patient Stated Goals Improve balance, pt would like to be able to drive    Currently in Pain? Yes    Pain Score  8     Pain Location Leg    Pain Orientation Right;Left    Pain Descriptors / Indicators Burning    Pain Type Chronic pain    Pain Onset More than a month ago    Pain Frequency Constant               TREATMENT   Neuromuscular Re-education  Modified tandem balance alternating forward LE x 30s each; Tandem balance alternating forward LE x 30s each; Tandem gait in // bars 8' x 4 lengths; Tandem gait on Airex balance beam 6' x 6 lengths; Side stepping on Airex balance beam 6' x 6 lengths;   Ther-ex Standing exercises with2# ankle weights: Marching x 10 BLE; Hip abduction x 10  BLE; Hip extension x 10 BLE; HS curls x 10 BLE;  He did LAQ 2#ankle weights x 10 BLE; Standing heel raises with BUE support x 20;  6" alternating step-ups without UE support alternating LE x 10 each; Forward BOSU lunges without upper extremity support alternating forward lower extremity x10 each;   Pt educated throughout session about proper posture and technique with exercises. Improved exercise technique, movement at target joints, use of target muscles after min to mod verbal, visual, tactile cues.   Pt demonstrates excellent motivation during session today.  Continued with balance and strength exercises today but with seated rest breaks.  Utilized moist heat pack on bilateral thighs due to cramping during session today.  Included additional balance exercises today such as Airex tandem walking and Airex sidesteps.  Pt encouraged to continue HEP and follow-up as scheduled.  Will continue to modify therapy sessions to find appropriate level of intensity in order to maximize benefit and minimize fatigue.  No HEP modifications on this date but will review at next session. Pt will benefit from PT services to address deficits in strength, balance, and mobility in order to return to full function at home.                         PT Short Term Goals - 01/03/21 1312      PT SHORT TERM GOAL #1   Title Pt will be independent with HEP in order to improve strength and balance in order to decrease fall risk and improve function at home.    Time 4    Period Weeks    Status New    Target Date 01/31/21             PT Long Term Goals - 01/03/21 1313      PT LONG TERM GOAL #1   Title Pt will improve ABC by at least 13% in order to demonstrate clinically significant improvement in balance confidence    Baseline 01/03/21: 41.875%    Time 8    Period Weeks    Status New    Target Date 02/28/21      PT LONG TERM GOAL #2   Title Pt will improve BERG by at least 3 points  in order to demonstrate clinically significant improvement in balance.    Baseline 01/03/21: 50/56    Time 8    Period Weeks    Status New    Target Date 02/28/21      PT LONG TERM GOAL #3   Title Pt will improve FOTO to at least 52 in order to demonstrate significant improvement in function    Baseline 01/03/21: 37    Time 8    Period Weeks  Status New    Target Date 02/28/21                 Plan - 01/18/21 0853    Clinical Impression Statement Pt demonstrates excellent motivation during session today.  Continued with balance and strength exercises today but with seated rest breaks.  Utilized moist heat pack on bilateral thighs due to cramping during session today.  Included additional balance exercises today such as Airex tandem walking and Airex sidesteps.  Pt encouraged to continue HEP and follow-up as scheduled.   Will continue to modify therapy sessions to find appropriate level of intensity in order to maximize benefit and minimize fatigue.  No HEP modifications on this date but will review at next session. Pt will benefit from PT services to address deficits in strength, balance, and mobility in order to return to full function at home.    Personal Factors and Comorbidities Comorbidity 3+;Time since onset of injury/illness/exacerbation    Comorbidities myelitis, s/p shoulder arthroscopic surgery, sarcoidosis, neuropathy    Examination-Activity Limitations Transfers    Examination-Participation Restrictions Community Activity;Driving;Shop;Yard Work    Stability/Clinical Decision Making Evolving/Moderate complexity    Rehab Potential Good    PT Frequency 2x / week    PT Duration 8 weeks    PT Treatment/Interventions ADLs/Self Care Home Management;Aquatic Therapy;Biofeedback;Canalith Repostioning;Cryotherapy;Electrical Stimulation;Iontophoresis 4mg /ml Dexamethasone;Moist Heat;Traction;Ultrasound;DME Instruction;Gait training;Stair training;Functional mobility training;Therapeutic  activities;Therapeutic exercise;Balance training;Neuromuscular re-education;Cognitive remediation;Patient/family education;Manual techniques;Passive range of motion;Dry needling;Vestibular;Joint Manipulations    PT Next Visit Plan review HEP, balance and strengthening    PT Home Exercise Plan Access Code: [email or web address]    Consulted and Agree with Plan of Care Patient           Patient will benefit from skilled therapeutic intervention in order to improve the following deficits and impairments:  Decreased balance,Impaired sensation,Decreased endurance  Visit Diagnosis: Unsteadiness on feet  Difficulty in walking, not elsewhere classified     Problem List Patient Active Problem List   Diagnosis Date Noted  . Myelitis (HCC) 06/21/2018  . On corticosteroid therapy 06/21/2018  . Sarcoidosis 06/17/2018  . Gait abnormality 02/25/2018  . Low serum thyroid stimulating hormone (TSH) 02/25/2018  . Serum gammaglobulin increased 02/25/2018  . Muscle weakness (generalized) 06/07/2014  . Closed fracture of glenoid cavity and neck of scapula 06/03/2014  . S/P arthroscopy of shoulder 06/03/2014  . Stiffness of joint, not elsewhere classified,  shoulder region 06/03/2014   08/03/2014 PT, DPT, GCS  Deletha Jaffee 01/18/2021, 10:39 AM  [place] [facility] [address] [place], [place], [place] Phone: [phone]   Fax:  [phone]  Name: William Lynn MRN: Zadie Cleverly Date of Birth: 07-Aug-1961

## 2021-01-22 NOTE — Patient Instructions (Addendum)
Access Code: [email or web address] URL: [email or web address] Date: 01/23/2021 Prepared by: Ria Comment  Exercises Standing Tandem Balance with Counter Support - 1 x daily - 7 x weekly - 3 x 30s with each foot forward hold Tandem Walking with Counter Support - 1 x daily - 7 x weekly - 3 reps - 30s hold Standing Single Leg Stance with Counter Support - 1 x daily - 7 x weekly - 3 x 30s on each leg hold Sit to Stand without Arm Support - 1 x daily - 7 x weekly - 2 sets - 10 reps Squat with Chair Touch - 1 x daily - 7 x weekly - 2 sets - 10 reps Standing Hip Abduction with Counter Support - 1 x daily - 7 x weekly - 2 sets - 10 reps - 3s hold

## 2021-01-23 ENCOUNTER — Ambulatory Visit: Payer: Medicaid Other

## 2021-01-23 ENCOUNTER — Other Ambulatory Visit: Payer: Self-pay

## 2021-01-23 DIAGNOSIS — R262 Difficulty in walking, not elsewhere classified: Secondary | ICD-10-CM

## 2021-01-23 DIAGNOSIS — R2681 Unsteadiness on feet: Secondary | ICD-10-CM | POA: Diagnosis not present

## 2021-01-23 NOTE — Therapy (Signed)
[facility] [address] [place], [place], [address] Phone: [phone]   Fax:  [phone]  Physical Therapy Treatment  Patient Details  Name: William Lynn MRN: [id number] Date of Birth: Aug 17, 1961 Referring Provider (PT): Sena Hitch   Encounter Date: 01/23/2021   PT End of Session - 01/23/21 1329    Visit Number 5    Number of Visits 17    Date for PT Re-Evaluation 02/28/21    Authorization Type eval: 01/03/21    PT Start Time 0845    PT Stop Time 0930    PT Time Calculation (min) 45 min    Equipment Utilized During Treatment Gait belt    Activity Tolerance Patient tolerated treatment well    Behavior During Therapy [facility] for tasks assessed/performed           Past Medical History:  Diagnosis Date  . GERD (gastroesophageal reflux disease)   . Neuropathy   . Sarcoidosis     Past Surgical History:  Procedure Laterality Date  . HERNIA REPAIR    . KNEE SURGERY Right   . SHOULDER SURGERY Bilateral     There were no vitals filed for this visit.   Subjective Assessment - 01/23/21 0847    Subjective Pt reports that he is doing alright today.  No LE pain reported upon arrival. He returns to see Dr. Alfredia Ferguson tomorrow. Denies any excessive soreness after last therapy session or muscle spasm/cramps.  No specific questions or concerns upon arrival.    Pertinent History Portions of history borrowed from EMR however all history was reviewed and confirmed with patient. Patient is a 60 y.o. year old male referred for mobility and balance deficits how has a diagnosis of neurosarcoidosis as well as myeltitis, for which he takes cellcept and prednisone. Pt denies any hopsital admissions related to these diagnoses. Major limitation is neuropathy which prevents him from working or driving extended distances. Neuropathy is in a stocking distribution that extends to the knees. He takes Lyrica and Cymbalta to help with this pain.   Recently had an MRI done that showed active spinal cord inflammation. Pt was very disappointed by these findings and had stopped taking all his medications however has restarted at this time. Intermittent use of small base quad cane around the house and in the community. Was previously receiving HH PT with last visit in September. He was working on balance exercises (tandem gait, gait with head turns, single leg balance) and LE strengthening (SLR, step ups). Pt was referred by physical medicine to a local counselor due to distress over his diagnoses and limitations and pt reports that it is going well. Pt was also referred by physical medicine to ENT for vertigo. He had a hearing test and was found to have bilateral hearing loss which pt also notices. Pt states that he has not had a VNG study. He is not having any more episodes of vertigo however as they have resolved. No falls in the last 6 months but pt reports that he has "come close." He has a shower chair which he uses when bathing. Pt has a treadmill, stationary bike, and weights at home but does not use exercise equipment at this time. Pt reports fluctuating energy levels with worsening energy throughout the day.    Limitations Walking    Diagnostic tests See history    Patient Stated Goals Improve balance, pt would like to be able to drive    Currently in Pain? No/denies  TREATMENT   Neuromuscular Re-education BOSU (round side up) static balance x 60s; BOSU (round side up) mini squats x 10; Airex tandem balance alternating forward LE x 30s each; Tandem gait in // bars 8' x 4lengths; Tandem gait on Airex balance beam 6' x 6 lengths;   Ther-ex Standing exercises with3# ankle weights: Marching x 20 BLE; Hip abduction x 15 BLE; Hip extension x 15 BLE; HS curls x 15 BLE;  Seated LAQ 3# ankle weights x 10 BLE; Standing heel raises with BUE support x 20; Mini Squats with BUE support x 10, cues to keep weight on  heels and increase hip hinge; 6" alternating step-ups without UE support alternating LE x 10 each; Forward BOSU lunges without upper extremity support alternating forward lower extremity x 10 each;   Pt educated throughout session about proper posture and technique with exercises. Improved exercise technique, movement at target joints, use of target muscles after min to mod verbal, visual, tactile cues.   Pt demonstrates excellent motivation during session today.Continuedwith balance and strength exercisestoday.   Patient is demonstrating improved endurance with exercises today. Included additional balance exercises today such as BOSU static balance and mini squats.  Pt encouraged to continue HEP and follow-up as scheduled.Will continue to modify therapy sessions to find appropriate level of intensity in order to maximize benefit and minimize fatigue.  Pt provided additional HEP modifications on this date.He will benefit from PT services to address deficits in strength, balance, and mobility in order to return to full function at home.                        PT Education - 01/23/21 1328    Education Details HEP    Person(s) Educated Patient    Methods Explanation;Handout    Comprehension Verbalized understanding            PT Short Term Goals - 01/03/21 1312      PT SHORT TERM GOAL #1   Title Pt will be independent with HEP in order to improve strength and balance in order to decrease fall risk and improve function at home.    Time 4    Period Weeks    Status New    Target Date 01/31/21             PT Long Term Goals - 01/03/21 1313      PT LONG TERM GOAL #1   Title Pt will improve ABC by at least 13% in order to demonstrate clinically significant improvement in balance confidence    Baseline 01/03/21: 41.875%    Time 8    Period Weeks    Status New    Target Date 02/28/21      PT LONG TERM GOAL #2   Title Pt will improve BERG by at  least 3 points in order to demonstrate clinically significant improvement in balance.    Baseline 01/03/21: 50/56    Time 8    Period Weeks    Status New    Target Date 02/28/21      PT LONG TERM GOAL #3   Title Pt will improve FOTO to at least 52 in order to demonstrate significant improvement in function    Baseline 01/03/21: 37    Time 8    Period Weeks    Status New    Target Date 02/28/21                 Plan -  01/23/21 0932    Clinical Impression Statement Pt demonstrates excellent motivation during session today.  Continued with balance and strength exercises today.   Patient is demonstrating improved endurance with exercises today. Included additional balance exercises today such as BOSU static balance and mini squats.  Pt encouraged to continue HEP and follow-up as scheduled.   Will continue to modify therapy sessions to find appropriate level of intensity in order to maximize benefit and minimize fatigue.  Pt provided additional HEP modifications on this date. He will benefit from PT services to address deficits in strength, balance, and mobility in order to return to full function at home.    Personal Factors and Comorbidities Comorbidity 3+;Time since onset of injury/illness/exacerbation    Comorbidities myelitis, s/p shoulder arthroscopic surgery, sarcoidosis, neuropathy    Examination-Activity Limitations Transfers    Examination-Participation Restrictions Community Activity;Driving;Shop;Yard Work    Stability/Clinical Decision Making Evolving/Moderate complexity    Rehab Potential Good    PT Frequency 2x / week    PT Duration 8 weeks    PT Treatment/Interventions ADLs/Self Care Home Management;Aquatic Therapy;Biofeedback;Canalith Repostioning;Cryotherapy;Electrical Stimulation;Iontophoresis 4mg /ml Dexamethasone;Moist Heat;Traction;Ultrasound;DME Instruction;Gait training;Stair training;Functional mobility training;Therapeutic activities;Therapeutic exercise;Balance  training;Neuromuscular re-education;Cognitive remediation;Patient/family education;Manual techniques;Passive range of motion;Dry needling;Vestibular;Joint Manipulations    PT Next Visit Plan review HEP, balance and strengthening    PT Home Exercise Plan Access Code: [email or web address]    Consulted and Agree with Plan of Care Patient           Patient will benefit from skilled therapeutic intervention in order to improve the following deficits and impairments:  Decreased balance,Impaired sensation,Decreased endurance  Visit Diagnosis: Unsteadiness on feet  Difficulty in walking, not elsewhere classified     Problem List Patient Active Problem List   Diagnosis Date Noted  . Myelitis (HCC) 06/21/2018  . On corticosteroid therapy 06/21/2018  . Sarcoidosis 06/17/2018  . Gait abnormality 02/25/2018  . Low serum thyroid stimulating hormone (TSH) 02/25/2018  . Serum gammaglobulin increased 02/25/2018  . Muscle weakness (generalized) 06/07/2014  . Closed fracture of glenoid cavity and neck of scapula 06/03/2014  . S/P arthroscopy of shoulder 06/03/2014  . Stiffness of joint, not elsewhere classified,  shoulder region 06/03/2014   08/03/2014 PT, DPT, GCS  Kysen Wetherington 01/23/2021, 1:33 PM  [facility] [address] [place], [place], [place] Phone: [phone]   Fax:  [phone]  Name: William Lynn MRN: Zadie Cleverly Date of Birth: May 12, 1961

## 2021-01-25 ENCOUNTER — Other Ambulatory Visit: Payer: Self-pay

## 2021-01-25 ENCOUNTER — Ambulatory Visit: Payer: Medicaid Other

## 2021-01-25 DIAGNOSIS — R262 Difficulty in walking, not elsewhere classified: Secondary | ICD-10-CM

## 2021-01-25 DIAGNOSIS — R2681 Unsteadiness on feet: Secondary | ICD-10-CM | POA: Diagnosis not present

## 2021-01-25 NOTE — Therapy (Signed)
[facility] [address] [place], [place], [address] Phone: [phone]   Fax:  [phone]  Physical Therapy Treatment  Patient Details  Name: William Lynn MRN: [id number] Date of Birth: Oct 14, 1961 Referring Provider (PT): Sena Hitch   Encounter Date: 01/25/2021   PT End of Session - 01/25/21 0851    Visit Number 6    Number of Visits 17    Date for PT Re-Evaluation 02/28/21    Authorization Type eval: 01/03/21    PT Start Time 0845    PT Stop Time 0930    PT Time Calculation (min) 45 min    Equipment Utilized During Treatment Gait belt    Activity Tolerance Patient tolerated treatment well    Behavior During Therapy [facility] for tasks assessed/performed           Past Medical History:  Diagnosis Date  . GERD (gastroesophageal reflux disease)   . Neuropathy   . Sarcoidosis     Past Surgical History:  Procedure Laterality Date  . HERNIA REPAIR    . KNEE SURGERY Right   . SHOULDER SURGERY Bilateral     There were no vitals filed for this visit.   Subjective Assessment - 01/25/21 0848    Subjective Pt reports that he is doing alright today.  No LE pain reported upon arrival. He saw Dr. Alfredia Ferguson yesterday and she prescribed him baclofen in order to help with his lower extremity spasms.  Denies any excessive soreness after last therapy session or muscle spasm/cramps today.  No specific questions or concerns upon arrival.    Pertinent History Portions of history borrowed from EMR however all history was reviewed and confirmed with patient. Patient is a 60 y.o. year old male referred for mobility and balance deficits how has a diagnosis of neurosarcoidosis as well as myeltitis, for which he takes cellcept and prednisone. Pt denies any hopsital admissions related to these diagnoses. Major limitation is neuropathy which prevents him from working or driving extended distances. Neuropathy is in a stocking distribution that  extends to the knees. He takes Lyrica and Cymbalta to help with this pain.  Recently had an MRI done that showed active spinal cord inflammation. Pt was very disappointed by these findings and had stopped taking all his medications however has restarted at this time. Intermittent use of small base quad cane around the house and in the community. Was previously receiving HH PT with last visit in September. He was working on balance exercises (tandem gait, gait with head turns, single leg balance) and LE strengthening (SLR, step ups). Pt was referred by physical medicine to a local counselor due to distress over his diagnoses and limitations and pt reports that it is going well. Pt was also referred by physical medicine to ENT for vertigo. He had a hearing test and was found to have bilateral hearing loss which pt also notices. Pt states that he has not had a VNG study. He is not having any more episodes of vertigo however as they have resolved. No falls in the last 6 months but pt reports that he has "come close." He has a shower chair which he uses when bathing. Pt has a treadmill, stationary bike, and weights at home but does not use exercise equipment at this time. Pt reports fluctuating energy levels with worsening energy throughout the day.    Limitations Walking    Diagnostic tests See history    Patient Stated Goals Improve balance, pt would  like to be able to drive    Currently in Pain? No/denies              TREATMENT   Neuromuscular Re-education All balance exercises performed without upper extremity support unless otherwise indicated; 12" hurdle step-over forward/backward x 10 each; Airex 6" hurdle step-over forward/backward x 10 each; Airex cone taps alternating x 10 each; 1/2 foam roll A/P balance x 60s; 1/2 foam roll tandem balance alternating forward LE x 60s each; Tandem gait in // bars 8' x 4lengths;   Ther-ex Sit to stand without UE support x10, notable fatigue  during the last few repetitions; Green tband resisted side stepping 10' x 6 lengths; BOSU forward lunges alternating leading lower extremity x10 on each side, improved stability noted today compared to previous session; Total Gym L22 single leg squats x 10 on each side; Total Gym L22 double leg squats x 10 on each side; Total Gym L22 heel raises 2 x 20;   Pt educated throughout session about proper posture and technique with exercises. Improved exercise technique, movement at target joints, use of target muscles after min to mod verbal, visual, tactile cues.   Pt demonstrates excellent motivation during session today.Continuedwith balance and strength exercisestoday.   Patient is demonstrating improved stability during balance exercises today.   Progress strengthening exercises today including activities on the Total Gym such as single-leg squats and heel raises.  Pt encouraged to continue HEP and follow-up as scheduled.Will continue to modify therapy sessions to find appropriate level of intensity in order to maximize benefit and minimize fatigue.  Pt provided additional HEP modifications on this date.He will benefit from PT services to address deficits in strength, balance, and mobility in order to return to full function at home.                           PT Short Term Goals - 01/03/21 1312      PT SHORT TERM GOAL #1   Title Pt will be independent with HEP in order to improve strength and balance in order to decrease fall risk and improve function at home.    Time 4    Period Weeks    Status New    Target Date 01/31/21             PT Long Term Goals - 01/03/21 1313      PT LONG TERM GOAL #1   Title Pt will improve ABC by at least 13% in order to demonstrate clinically significant improvement in balance confidence    Baseline 01/03/21: 41.875%    Time 8    Period Weeks    Status New    Target Date 02/28/21      PT LONG TERM GOAL #2   Title  Pt will improve BERG by at least 3 points in order to demonstrate clinically significant improvement in balance.    Baseline 01/03/21: 50/56    Time 8    Period Weeks    Status New    Target Date 02/28/21      PT LONG TERM GOAL #3   Title Pt will improve FOTO to at least 52 in order to demonstrate significant improvement in function    Baseline 01/03/21: 37    Time 8    Period Weeks    Status New    Target Date 02/28/21  Plan - 01/25/21 1334    Clinical Impression Statement Pt demonstrates excellent motivation during session today.  Continued with balance and strength exercises today.   Patient is demonstrating improved stability during balance exercises today.   Progress strengthening exercises today including activities on the Total Gym such as single-leg squats and heel raises.  Pt encouraged to continue HEP and follow-up as scheduled.   Will continue to modify therapy sessions to find appropriate level of intensity in order to maximize benefit and minimize fatigue.  Pt provided additional HEP modifications on this date. He will benefit from PT services to address deficits in strength, balance, and mobility in order to return to full function at home.    Personal Factors and Comorbidities Comorbidity 3+;Time since onset of injury/illness/exacerbation    Comorbidities myelitis, s/p shoulder arthroscopic surgery, sarcoidosis, neuropathy    Examination-Activity Limitations Transfers    Examination-Participation Restrictions Community Activity;Driving;Shop;Yard Work    Stability/Clinical Decision Making Evolving/Moderate complexity    Rehab Potential Good    PT Frequency 2x / week    PT Duration 8 weeks    PT Treatment/Interventions ADLs/Self Care Home Management;Aquatic Therapy;Biofeedback;Canalith Repostioning;Cryotherapy;Electrical Stimulation;Iontophoresis 4mg /ml Dexamethasone;Moist Heat;Traction;Ultrasound;DME Instruction;Gait training;Stair training;Functional  mobility training;Therapeutic activities;Therapeutic exercise;Balance training;Neuromuscular re-education;Cognitive remediation;Patient/family education;Manual techniques;Passive range of motion;Dry needling;Vestibular;Joint Manipulations    PT Next Visit Plan review HEP, balance and strengthening    PT Home Exercise Plan Access Code: [email or web address]    Consulted and Agree with Plan of Care Patient           Patient will benefit from skilled therapeutic intervention in order to improve the following deficits and impairments:  Decreased balance,Impaired sensation,Decreased endurance  Visit Diagnosis: Unsteadiness on feet  Difficulty in walking, not elsewhere classified     Problem List Patient Active Problem List   Diagnosis Date Noted  . Myelitis (HCC) 06/21/2018  . On corticosteroid therapy 06/21/2018  . Sarcoidosis 06/17/2018  . Gait abnormality 02/25/2018  . Low serum thyroid stimulating hormone (TSH) 02/25/2018  . Serum gammaglobulin increased 02/25/2018  . Muscle weakness (generalized) 06/07/2014  . Closed fracture of glenoid cavity and neck of scapula 06/03/2014  . S/P arthroscopy of shoulder 06/03/2014  . Stiffness of joint, not elsewhere classified,  shoulder region 06/03/2014   08/03/2014 PT, DPT, GCS  Glenford Garis 01/25/2021, 1:37 PM  [facility] [address] [place], [place], [place] Phone: [phone]   Fax:  [phone]  Name: William Lynn MRN: Zadie Cleverly Date of Birth: [birth year]

## 2021-01-30 ENCOUNTER — Other Ambulatory Visit: Payer: Self-pay

## 2021-01-30 ENCOUNTER — Ambulatory Visit: Payer: Medicaid Other | Attending: Physical Medicine and Rehabilitation

## 2021-01-30 DIAGNOSIS — R262 Difficulty in walking, not elsewhere classified: Secondary | ICD-10-CM | POA: Insufficient documentation

## 2021-01-30 DIAGNOSIS — R2681 Unsteadiness on feet: Secondary | ICD-10-CM | POA: Insufficient documentation

## 2021-01-30 NOTE — Therapy (Signed)
[facility] [address] [place], [place], [address] Phone: [phone]   Fax:  [phone]  Physical Therapy Treatment  Patient Details  Name: William Lynn MRN: [id number] Date of Birth: 1961/11/08 Referring Provider (PT): Sena Hitch   Encounter Date: 01/30/2021   PT End of Session - 01/30/21 0852    Visit Number 7    Number of Visits 17    Date for PT Re-Evaluation 02/28/21    Authorization Type eval: 01/03/21    PT Start Time 0845    PT Stop Time 0929    PT Time Calculation (min) 44 min    Equipment Utilized During Treatment Gait belt    Activity Tolerance Patient tolerated treatment well    Behavior During Therapy [facility] for tasks assessed/performed           Past Medical History:  Diagnosis Date  . GERD (gastroesophageal reflux disease)   . Neuropathy   . Sarcoidosis     Past Surgical History:  Procedure Laterality Date  . HERNIA REPAIR    . KNEE SURGERY Right   . SHOULDER SURGERY Bilateral     There were no vitals filed for this visit.   Subjective Assessment - 01/30/21 0849    Subjective Patient reported that is legs are bothering him today. Stated they feel painful, weak, feels like he can't do anything. Stated Dr. Alfredia Ferguson prescribed him baclofen, but he does not like taking it, has my charted his MD and has stopped taking it.    Pertinent History Portions of history borrowed from EMR however all history was reviewed and confirmed with patient. Patient is a 60 y.o. year old male referred for mobility and balance deficits how has a diagnosis of neurosarcoidosis as well as myeltitis, for which he takes cellcept and prednisone. Pt denies any hopsital admissions related to these diagnoses. Major limitation is neuropathy which prevents him from working or driving extended distances. Neuropathy is in a stocking distribution that extends to the knees. He takes Lyrica and Cymbalta to help with this pain.  Recently  had an MRI done that showed active spinal cord inflammation. Pt was very disappointed by these findings and had stopped taking all his medications however has restarted at this time. Intermittent use of small base quad cane around the house and in the community. Was previously receiving HH PT with last visit in September. He was working on balance exercises (tandem gait, gait with head turns, single leg balance) and LE strengthening (SLR, step ups). Pt was referred by physical medicine to a local counselor due to distress over his diagnoses and limitations and pt reports that it is going well. Pt was also referred by physical medicine to ENT for vertigo. He had a hearing test and was found to have bilateral hearing loss which pt also notices. Pt states that he has not had a VNG study. He is not having any more episodes of vertigo however as they have resolved. No falls in the last 6 months but pt reports that he has "come close." He has a shower chair which he uses when bathing. Pt has a treadmill, stationary bike, and weights at home but does not use exercise equipment at this time. Pt reports fluctuating energy levels with worsening energy throughout the day.    Limitations Walking    Diagnostic tests See history    Patient Stated Goals Improve balance, pt would like to be able to drive    Currently in Pain?  Yes    Pain Score 6     Pain Location Leg    Pain Orientation Right;Left    Pain Descriptors / Indicators Tiring;Burning   fatigue   Pain Type Chronic pain    Pain Onset More than a month ago           TREATMENT  Nustep for warm up today (unbilled)  Neuromuscular Re-education    All balance exercises performed without upper extremity support unless otherwise indicated;   Airex cone taps alternating x 10 each;   1/2 foam roll A/P balance x 60s;   1/2 foam roll tandem balance alternating forward LE x 60s each;    Vitals assessed:  spO2 98% HR 91 BP initially 134/93 after  interventions  BP after 2 minute rest: 130/84         Pt educated throughout session about proper posture and technique with exercises. Improved exercise technique, movement at target joints, use of target muscles after min to mod verbal, visual, tactile cues.    Pt response/clinical impression: Pt asked to end session early. Vitals assessed twice to ensure safety, diastolic initially elevated, but decreased with seated rest breaks. Pt reported to fatigued and his legs were too worn out to do anything else today. Pt to return for his appointment Thursday. Encouraged to follow up with MD about concerns for baclofen, pt verbalized understanding. The patient would benefit from further skilled PT intervention to maximize function and safety.            PT Education - 01/30/21 0851    Education Details therex    Person(s) Educated Patient    Methods Explanation;Handout    Comprehension Verbalized understanding            PT Short Term Goals - 01/03/21 1312      PT SHORT TERM GOAL #1   Title Pt will be independent with HEP in order to improve strength and balance in order to decrease fall risk and improve function at home.    Time 4    Period Weeks    Status New    Target Date 01/31/21             PT Long Term Goals - 01/03/21 1313      PT LONG TERM GOAL #1   Title Pt will improve ABC by at least 13% in order to demonstrate clinically significant improvement in balance confidence    Baseline 01/03/21: 41.875%    Time 8    Period Weeks    Status New    Target Date 02/28/21      PT LONG TERM GOAL #2   Title Pt will improve BERG by at least 3 points in order to demonstrate clinically significant improvement in balance.    Baseline 01/03/21: 50/56    Time 8    Period Weeks    Status New    Target Date 02/28/21      PT LONG TERM GOAL #3   Title Pt will improve FOTO to at least 52 in order to demonstrate significant improvement in function    Baseline 01/03/21: 37     Time 8    Period Weeks    Status New    Target Date 02/28/21                 Plan - 01/30/21 0851    Clinical Impression Statement Pt asked to end session early. Vitals assessed twice to ensure safety, diastolic initially elevated, but  decreased with seated rest breaks. Pt reported to fatigued and his legs were too worn out to do anything else today. Pt to return for his appointment Thursday. Encouraged to follow up with MD about concerns for baclofen, pt verbalized understanding. The patient would benefit from further skilled PT intervention to maximize function and safety.    Personal Factors and Comorbidities Comorbidity 3+;Time since onset of injury/illness/exacerbation    Comorbidities myelitis, s/p shoulder arthroscopic surgery, sarcoidosis, neuropathy    Examination-Activity Limitations Transfers    Examination-Participation Restrictions Community Activity;Driving;Shop;Yard Work    Stability/Clinical Decision Making Evolving/Moderate complexity    Rehab Potential Good    PT Frequency 2x / week    PT Duration 8 weeks    PT Treatment/Interventions ADLs/Self Care Home Management;Aquatic Therapy;Biofeedback;Canalith Repostioning;Cryotherapy;Electrical Stimulation;Iontophoresis 4mg /ml Dexamethasone;Moist Heat;Traction;Ultrasound;DME Instruction;Gait training;Stair training;Functional mobility training;Therapeutic activities;Therapeutic exercise;Balance training;Neuromuscular re-education;Cognitive remediation;Patient/family education;Manual techniques;Passive range of motion;Dry needling;Vestibular;Joint Manipulations    PT Next Visit Plan review HEP, balance and strengthening    PT Home Exercise Plan Access Code: [email or web address]    Consulted and Agree with Plan of Care Patient           Patient will benefit from skilled therapeutic intervention in order to improve the following deficits and impairments:  Decreased balance,Impaired sensation,Decreased endurance  Visit  Diagnosis: Unsteadiness on feet  Difficulty in walking, not elsewhere classified     Problem List Patient Active Problem List   Diagnosis Date Noted  . Myelitis (HCC) 06/21/2018  . On corticosteroid therapy 06/21/2018  . Sarcoidosis 06/17/2018  . Gait abnormality 02/25/2018  . Low serum thyroid stimulating hormone (TSH) 02/25/2018  . Serum gammaglobulin increased 02/25/2018  . Muscle weakness (generalized) 06/07/2014  . Closed fracture of glenoid cavity and neck of scapula 06/03/2014  . S/P arthroscopy of shoulder 06/03/2014  . Stiffness of joint, not elsewhere classified,  shoulder region 06/03/2014    08/03/2014 PT, DPT 9:24 AM,01/30/21   [facility] [address] [place], [place], [place] Phone: [phone]   Fax:  [phone]  Name: William Lynn MRN: Zadie Cleverly Date of Birth: [birth year]

## 2021-02-01 ENCOUNTER — Ambulatory Visit: Payer: Medicaid Other

## 2021-02-01 ENCOUNTER — Other Ambulatory Visit: Payer: Self-pay

## 2021-02-01 DIAGNOSIS — R2681 Unsteadiness on feet: Secondary | ICD-10-CM | POA: Diagnosis not present

## 2021-02-01 DIAGNOSIS — R262 Difficulty in walking, not elsewhere classified: Secondary | ICD-10-CM

## 2021-02-01 NOTE — Therapy (Signed)
[facility] [address]. [place], [place], [address] Phone: [phone]   Fax:  [phone]  Physical Therapy Treatment  Patient Details  Name: William Lynn MRN: [id number] Date of Birth: November 22, 1961 Referring Provider (PT): Sena Hitch   Encounter Date: 02/01/2021   PT End of Session - 02/01/21 0849    Visit Number 8    Number of Visits 17    Date for PT Re-Evaluation 02/28/21    Authorization Type eval: 01/03/21    PT Start Time 0845    PT Stop Time 0930    PT Time Calculation (min) 45 min    Equipment Utilized During Treatment Gait belt    Activity Tolerance Patient tolerated treatment well    Behavior During Therapy [facility] for tasks assessed/performed           Past Medical History:  Diagnosis Date  . GERD (gastroesophageal reflux disease)   . Neuropathy   . Sarcoidosis     Past Surgical History:  Procedure Laterality Date  . HERNIA REPAIR    . KNEE SURGERY Right   . SHOULDER SURGERY Bilateral     There were no vitals filed for this visit.   Subjective Assessment - 02/01/21 0844    Subjective Patient reported that his legs are bothering him again today however are significantly improved compared to Tuesday. He is complaining of 3/10 anterior R thigh pain. He has stopped taking the Baclofen due to imbalance/drowsiness. No specific questions upon arrival.    Pertinent History Portions of history borrowed from EMR however all history was reviewed and confirmed with patient. Patient is a 60 y.o. year old male referred for mobility and balance deficits how has a diagnosis of neurosarcoidosis as well as myeltitis, for which he takes cellcept and prednisone. Pt denies any hopsital admissions related to these diagnoses. Major limitation is neuropathy which prevents him from working or driving extended distances. Neuropathy is in a stocking distribution that extends to the knees. He takes Lyrica and Cymbalta to help with  this pain.  Recently had an MRI done that showed active spinal cord inflammation. Pt was very disappointed by these findings and had stopped taking all his medications however has restarted at this time. Intermittent use of small base quad cane around the house and in the community. Was previously receiving HH PT with last visit in September. He was working on balance exercises (tandem gait, gait with head turns, single leg balance) and LE strengthening (SLR, step ups). Pt was referred by physical medicine to a local counselor due to distress over his diagnoses and limitations and pt reports that it is going well. Pt was also referred by physical medicine to ENT for vertigo. He had a hearing test and was found to have bilateral hearing loss which pt also notices. Pt states that he has not had a VNG study. He is not having any more episodes of vertigo however as they have resolved. No falls in the last 6 months but pt reports that he has "come close." He has a shower chair which he uses when bathing. Pt has a treadmill, stationary bike, and weights at home but does not use exercise equipment at this time. Pt reports fluctuating energy levels with worsening energy throughout the day.    Limitations Walking    Diagnostic tests See history    Patient Stated Goals Improve balance, pt would like to be able to drive    Currently in Pain? Yes  Pain Score 3     Pain Location Leg    Pain Orientation Right    Pain Descriptors / Indicators Stabbing    Pain Type Chronic pain    Pain Onset More than a month ago    Pain Frequency Constant                TREATMENT   Neuromuscular Re-education All balance exercises performed in // bars without upper extremity support unless otherwise indicated; Tandem gait  8' x 4lengths; Airex balance beam tandem gait 6' x 4 lengths; Airex balance beam side stepping 6' x 4 lengths; Airex tandem balance with dynamic reaching across body to grab cone and stack on  ipsilateral side x multiple bouts performed on each side; 1/2 foam roll A/P balance x 60s;   Ther-ex NuStep L0-2 x 5 minutes for warm-up during history, seat 14, hands 11 (3 minutes unbilled); Total Gym L22 double leg squats 2 x 20; Total Gym L22 heel raises 2 x 15; Green tband resisted side stepping 10' x 6 lengths; Seated LAQ with 3# ankle weights 2 x 10;  Standing exercises with 3# ankle weights BLE: Marching x 10; Hip abduction x 10 ; HS curls x 10; Hip extension x 10;  BOSU forward lunges alternating leading lower extremity x10 on each side, improved stability noted today compared to previous session;    Pt educated throughout session about proper posture and technique with exercises. Improved exercise technique, movement at target joints, use of target muscles after min to mod verbal, visual, tactile cues.   Pt demonstrates excellent motivation during session today.Continuedwith balance and strength exercisestoday and pt denies any increase in anterior R thigh pain or excessive BLE soreness. Pt allowed to adjust sets and reps based on fatigue and appropriate challenge. Tolerance for activity is significantly better today compared to last session. Pt encouraged to follow-up with MD regarding dosing/frequency of Baclofen. Pt encouraged to continue HEP and follow-up as scheduled.Will continue to modify therapy sessions to find appropriate level of intensity in order to maximize benefit and minimize fatigue.He will benefit from PT services to address deficits in strength, balance, and mobility in order to return to full function at home.                           PT Education - 02/01/21 0909    Education Details therex    Person(s) Educated Patient    Methods Explanation    Comprehension Verbalized understanding            PT Short Term Goals - 01/03/21 1312      PT SHORT TERM GOAL #1   Title Pt will be independent with HEP in order to  improve strength and balance in order to decrease fall risk and improve function at home.    Time 4    Period Weeks    Status New    Target Date 01/31/21             PT Long Term Goals - 01/03/21 1313      PT LONG TERM GOAL #1   Title Pt will improve ABC by at least 13% in order to demonstrate clinically significant improvement in balance confidence    Baseline 01/03/21: 41.875%    Time 8    Period Weeks    Status New    Target Date 02/28/21      PT LONG TERM GOAL #2   Title Pt  will improve BERG by at least 3 points in order to demonstrate clinically significant improvement in balance.    Baseline 01/03/21: 50/56    Time 8    Period Weeks    Status New    Target Date 02/28/21      PT LONG TERM GOAL #3   Title Pt will improve FOTO to at least 52 in order to demonstrate significant improvement in function    Baseline 01/03/21: 37    Time 8    Period Weeks    Status New    Target Date 02/28/21                 Plan - 02/01/21 0850    Clinical Impression Statement Pt demonstrates excellent motivation during session today.  Continued with balance and strength exercises today and pt denies any increase in anterior R thigh pain or excessive BLE soreness. Pt allowed to adjust sets and reps based on fatigue and appropriate challenge. Tolerance for activity is significantly better today compared to last session. Pt encouraged to follow-up with MD regarding dosing/frequency of Baclofen. Pt encouraged to continue HEP and follow-up as scheduled.   Will continue to modify therapy sessions to find appropriate level of intensity in order to maximize benefit and minimize fatigue. He will benefit from PT services to address deficits in strength, balance, and mobility in order to return to full function at home.    Personal Factors and Comorbidities Comorbidity 3+;Time since onset of injury/illness/exacerbation    Comorbidities myelitis, s/p shoulder arthroscopic surgery, sarcoidosis,  neuropathy    Examination-Activity Limitations Transfers    Examination-Participation Restrictions Community Activity;Driving;Shop;Yard Work    Stability/Clinical Decision Making Evolving/Moderate complexity    Rehab Potential Good    PT Frequency 2x / week    PT Duration 8 weeks    PT Treatment/Interventions ADLs/Self Care Home Management;Aquatic Therapy;Biofeedback;Canalith Repostioning;Cryotherapy;Electrical Stimulation;Iontophoresis 4mg /ml Dexamethasone;Moist Heat;Traction;Ultrasound;DME Instruction;Gait training;Stair training;Functional mobility training;Therapeutic activities;Therapeutic exercise;Balance training;Neuromuscular re-education;Cognitive remediation;Patient/family education;Manual techniques;Passive range of motion;Dry needling;Vestibular;Joint Manipulations    PT Next Visit Plan review HEP, balance and strengthening    PT Home Exercise Plan Access Code: [email or web address]    Consulted and Agree with Plan of Care Patient           Patient will benefit from skilled therapeutic intervention in order to improve the following deficits and impairments:  Decreased balance,Impaired sensation,Decreased endurance  Visit Diagnosis: Unsteadiness on feet  Difficulty in walking, not elsewhere classified     Problem List Patient Active Problem List   Diagnosis Date Noted  . Myelitis (HCC) 06/21/2018  . On corticosteroid therapy 06/21/2018  . Sarcoidosis 06/17/2018  . Gait abnormality 02/25/2018  . Low serum thyroid stimulating hormone (TSH) 02/25/2018  . Serum gammaglobulin increased 02/25/2018  . Muscle weakness (generalized) 06/07/2014  . Closed fracture of glenoid cavity and neck of scapula 06/03/2014  . S/P arthroscopy of shoulder 06/03/2014  . Stiffness of joint, not elsewhere classified,  shoulder region 06/03/2014   08/03/2014 PT, DPT, GCS  Auron Tadros 02/01/2021, 4:39 PM  [facility] [address] [place],  [place], [place] Phone: [phone]   Fax:  [phone]  Name: Juanito Gonyer MRN: Zadie Cleverly Date of Birth: 08/25/1961

## 2021-02-06 ENCOUNTER — Ambulatory Visit: Payer: Medicaid Other

## 2021-02-06 ENCOUNTER — Other Ambulatory Visit: Payer: Self-pay

## 2021-02-06 DIAGNOSIS — R262 Difficulty in walking, not elsewhere classified: Secondary | ICD-10-CM

## 2021-02-06 DIAGNOSIS — R2681 Unsteadiness on feet: Secondary | ICD-10-CM

## 2021-02-06 NOTE — Therapy (Signed)
[facility] [address] [place], [place], [address] Phone: [phone]   Fax:  [phone]  Physical Therapy Treatment  Patient Details  Name: William Lynn MRN: [id number] Date of Birth: 04/13/1961 Referring Provider (PT): Sena Hitch   Encounter Date: 02/06/2021   PT End of Session - 02/06/21 0847    Visit Number 9    Number of Visits 17    Date for PT Re-Evaluation 02/28/21    Authorization Type eval: 01/03/21    PT Start Time 0845    PT Stop Time 0930    PT Time Calculation (min) 45 min    Equipment Utilized During Treatment Gait belt    Activity Tolerance Patient tolerated treatment well    Behavior During Therapy [facility] for tasks assessed/performed           Past Medical History:  Diagnosis Date  . GERD (gastroesophageal reflux disease)   . Neuropathy   . Sarcoidosis     Past Surgical History:  Procedure Laterality Date  . HERNIA REPAIR    . KNEE SURGERY Right   . SHOULDER SURGERY Bilateral     There were no vitals filed for this visit.   Subjective Assessment - 02/06/21 0847    Subjective Patient reported that he had a great day yesterday however his legs are starting to bother him again this morning. He is complaining of 2/10 thigh pain upon arrival today. He heard back from Dr. Alfredia Ferguson and she encouraged him to stop the Baclofen due to his side effects. No specific questions upon arrival.    Pertinent History Portions of history borrowed from EMR however all history was reviewed and confirmed with patient. Patient is a 60 y.o. year old male referred for mobility and balance deficits how has a diagnosis of neurosarcoidosis as well as myeltitis, for which he takes cellcept and prednisone. Pt denies any hopsital admissions related to these diagnoses. Major limitation is neuropathy which prevents him from working or driving extended distances. Neuropathy is in a stocking distribution that extends to the knees.  He takes Lyrica and Cymbalta to help with this pain.  Recently had an MRI done that showed active spinal cord inflammation. Pt was very disappointed by these findings and had stopped taking all his medications however has restarted at this time. Intermittent use of small base quad cane around the house and in the community. Was previously receiving HH PT with last visit in September. He was working on balance exercises (tandem gait, gait with head turns, single leg balance) and LE strengthening (SLR, step ups). Pt was referred by physical medicine to a local counselor due to distress over his diagnoses and limitations and pt reports that it is going well. Pt was also referred by physical medicine to ENT for vertigo. He had a hearing test and was found to have bilateral hearing loss which pt also notices. Pt states that he has not had a VNG study. He is not having any more episodes of vertigo however as they have resolved. No falls in the last 6 months but pt reports that he has "come close." He has a shower chair which he uses when bathing. Pt has a treadmill, stationary bike, and weights at home but does not use exercise equipment at this time. Pt reports fluctuating energy levels with worsening energy throughout the day.    Limitations Walking    Diagnostic tests See history    Patient Stated Goals Improve balance, pt would like  to be able to drive    Currently in Pain? Yes    Pain Score 2     Pain Location Leg    Pain Orientation Right;Left    Pain Descriptors / Indicators Stabbing    Pain Type Chronic pain    Pain Onset More than a month ago    Pain Frequency Constant              TREATMENT   Neuromuscular Re-education All balance exercises performed in // bars without upper extremity support unless otherwise indicated; 12" hurdle forward/backward stepping x 15; Airex 6" alternating toe taps; Airex alternating 6" cone taps to work on slow and controlled coordinating  movements; Airex NBOS eyes closed x 30s; Tandem gait  8' x 4lengths;   Ther-ex NuStep L2-4 x 5 minutes for warm-up during history, seat 14, hands 11 (3 minutes unbilled);  Standing exercises with 3# ankle weights BLE: Marching x 15; Hip abduction x 10 ; HS curls x 10; Hip extension x 10;  Total Gym L22 double leg squats 2 x 20; Total Gym L22 heel raises 2 x 15; Green tband resisted side stepping 10' x 6 lengths; Seated LAQ with 3# ankle weights 2 x 10;  BOSU forward lunges alternating leading lower extremity x10 on each side, improved stability noted today compared to previous session;   Pt educated throughout session about proper posture and technique with exercises. Improved exercise technique, movement at target joints, use of target muscles after min to mod verbal, visual, tactile cues.   Pt demonstrates excellent motivation during session today.Continuedwith balance and strength exercisestoday and pt denies any increase in anterior R thigh pain or excessive BLE soreness. Utilized moist heat packs on anterior thighs between exercises to help with any residual soreness. Introduced new balance exercises into session today including hurdle steps and Airex cone taps. Pt encouraged to continue HEP and follow-up as scheduled.Will continue to modify therapy sessions to find appropriate level of intensity in order to maximize benefit and minimize fatigue.He will benefit from PT services to address deficits in strength, balance, and mobility in order to return to full function at home.                            PT Short Term Goals - 01/03/21 1312      PT SHORT TERM GOAL #1   Title Pt will be independent with HEP in order to improve strength and balance in order to decrease fall risk and improve function at home.    Time 4    Period Weeks    Status New    Target Date 01/31/21             PT Long Term Goals - 01/03/21 1313      PT LONG  TERM GOAL #1   Title Pt will improve ABC by at least 13% in order to demonstrate clinically significant improvement in balance confidence    Baseline 01/03/21: 41.875%    Time 8    Period Weeks    Status New    Target Date 02/28/21      PT LONG TERM GOAL #2   Title Pt will improve BERG by at least 3 points in order to demonstrate clinically significant improvement in balance.    Baseline 01/03/21: 50/56    Time 8    Period Weeks    Status New    Target Date 02/28/21  PT LONG TERM GOAL #3   Title Pt will improve FOTO to at least 52 in order to demonstrate significant improvement in function    Baseline 01/03/21: 37    Time 8    Period Weeks    Status New    Target Date 02/28/21                 Plan - 02/06/21 0848    Clinical Impression Statement Pt demonstrates excellent motivation during session today.  Continued with balance and strength exercises today and pt denies any increase in anterior R thigh pain or excessive BLE soreness. Utilized moist heat packs on anterior thighs between exercises to help with any residual soreness. Introduced new balance exercises into session today including hurdle steps and Airex cone taps. Pt encouraged to continue HEP and follow-up as scheduled.   Will continue to modify therapy sessions to find appropriate level of intensity in order to maximize benefit and minimize fatigue. He will benefit from PT services to address deficits in strength, balance, and mobility in order o return to full function at home.    Personal Factors and Comorbidities Comorbidity 3+;Time since onset of injury/illness/exacerbation    Comorbidities myelitis, s/p shoulder arthroscopic surgery, sarcoidosis, neuropathy    Examination-Activity Limitations Transfers    Examination-Participation Restrictions Community Activity;Driving;Shop;Yard Work    Stability/Clinical Decision Making Evolving/Moderate complexity    Rehab Potential Good    PT Frequency 2x / week    PT  Duration 8 weeks    PT Treatment/Interventions ADLs/Self Care Home Management;Aquatic Therapy;Biofeedback;Canalith Repostioning;Cryotherapy;Electrical Stimulation;Iontophoresis 4mg /ml Dexamethasone;Moist Heat;Traction;Ultrasound;DME Instruction;Gait training;Stair training;Functional mobility training;Therapeutic activities;Therapeutic exercise;Balance training;Neuromuscular re-education;Cognitive remediation;Patient/family education;Manual techniques;Passive range of motion;Dry needling;Vestibular;Joint Manipulations    PT Next Visit Plan review HEP, balance and strengthening    PT Home Exercise Plan Access Code: [email or web address]    Consulted and Agree with Plan of Care Patient           Patient will benefit from skilled therapeutic intervention in order to improve the following deficits and impairments:  Decreased balance,Impaired sensation,Decreased endurance  Visit Diagnosis: Unsteadiness on feet  Difficulty in walking, not elsewhere classified     Problem List Patient Active Problem List   Diagnosis Date Noted  . Myelitis (HCC) 06/21/2018  . On corticosteroid therapy 06/21/2018  . Sarcoidosis 06/17/2018  . Gait abnormality 02/25/2018  . Low serum thyroid stimulating hormone (TSH) 02/25/2018  . Serum gammaglobulin increased 02/25/2018  . Muscle weakness (generalized) 06/07/2014  . Closed fracture of glenoid cavity and neck of scapula 06/03/2014  . S/P arthroscopy of shoulder 06/03/2014  . Stiffness of joint, not elsewhere classified,  shoulder region 06/03/2014   08/03/2014 PT, DPT, GCS  Columbia Pandey 02/07/2021, 9:24 AM  [facility] [address] [place], [place], [place] Phone: [phone]   Fax:  [phone]  Name: William Lynn MRN: Zadie Cleverly Date of Birth: 12-21-1961

## 2021-02-07 NOTE — Patient Instructions (Incomplete)
Physical Therapy Progress Note   Dates of reporting period  01/03/21   to   02/08/21  TREATMENT   Neuromuscular Re-education All balance exercises performed in // bars without upper extremity support unless otherwise indicated; Updated outcome measures and goals with patient: ABC BERG FOTO  12" hurdle forward/backward stepping x 15; Airex 6" alternating toe taps; Airex alternating 6" cone taps to work on slow and controlled coordinating movements; Airex NBOS eyes closed x 30s; Tandem gait  8' x 4lengths;   Ther-ex NuStep L2-4 x 5 minutes for warm-up during history, seat 14, hands 11 (3 minutes unbilled);  Standing exercises with 3# ankle weights BLE: Marching x 15; Hip abduction x 10 ; HS curls x 10; Hip extension x 10;  Total Gym L22 double leg squats 2 x 20; Total Gym L22 heel raises 2 x 15; Green tband resisted side stepping 10' x 6 lengths; Seated LAQ with 3# ankle weights 2 x 10;  BOSU forward lunges alternating leading lower extremity x10 on each side, improved stability noted today compared to previous session;   Pt educated throughout session about proper posture and technique with exercises. Improved exercise technique, movement at target joints, use of target muscles after min to mod verbal, visual, tactile cues.   Pt demonstrates excellent motivation during session today.Continuedwith balance and strength exercisestoday and pt denies any increase in anterior R thigh pain or excessive BLE soreness. Utilized moist heat packs on anterior thighs between exercises to help with any residual soreness. Introduced new balance exercises into session today including hurdle steps and Airex cone taps. Pt encouraged to continue HEP and follow-up as scheduled.Will continue to modify therapy sessions to find appropriate level of intensity in order to maximize benefit and minimize fatigue.He will benefit from PT services to address deficits in strength, balance,  and mobility in order to return to full function at home.

## 2021-02-08 ENCOUNTER — Ambulatory Visit: Payer: Medicaid Other

## 2021-02-08 ENCOUNTER — Other Ambulatory Visit: Payer: Self-pay

## 2021-02-08 DIAGNOSIS — R2681 Unsteadiness on feet: Secondary | ICD-10-CM

## 2021-02-08 DIAGNOSIS — R262 Difficulty in walking, not elsewhere classified: Secondary | ICD-10-CM

## 2021-02-08 NOTE — Therapy (Signed)
[place] [address] [place], [place], [address] Phone: [phone]   Fax:  [phone]  Physical Therapy Treatment/Progress Note  Dates of reporting period  01/03/21   to   02/08/21  Patient Details  Name: William Lynn MRN: [id number] Date of Birth: Nov 19, 1961 Referring Provider (PT): Sena Hitch   Encounter Date: 02/08/2021   PT End of Session - 02/08/21 0850    Visit Number 10    Number of Visits 17    Date for PT Re-Evaluation 02/28/21    Authorization Type eval: 01/03/21    PT Start Time 0845    PT Stop Time 0930    PT Time Calculation (min) 45 min    Equipment Utilized During Treatment Gait belt    Activity Tolerance Patient tolerated treatment well    Behavior During Therapy [facility] for tasks assessed/performed           Past Medical History:  Diagnosis Date  . GERD (gastroesophageal reflux disease)   . Neuropathy   . Sarcoidosis     Past Surgical History:  Procedure Laterality Date  . HERNIA REPAIR    . KNEE SURGERY Right   . SHOULDER SURGERY Bilateral     There were no vitals filed for this visit.   Subjective Assessment - 02/08/21 0847    Subjective Patient reported that his legs are sore tomorrow but he denies any leg pain upon arrival today.  No falls and no updates since last therapy session. No specific questions upon arrival.    Pertinent History Portions of history borrowed from EMR however all history was reviewed and confirmed with patient. Patient is a 60 y.o. year old male referred for mobility and balance deficits how has a diagnosis of neurosarcoidosis as well as myeltitis, for which he takes cellcept and prednisone. Pt denies any hopsital admissions related to these diagnoses. Major limitation is neuropathy which prevents him from working or driving extended distances. Neuropathy is in a stocking distribution that extends to the knees. He takes Lyrica and Cymbalta to help with this pain.   Recently had an MRI done that showed active spinal cord inflammation. Pt was very disappointed by these findings and had stopped taking all his medications however has restarted at this time. Intermittent use of small base quad cane around the house and in the community. Was previously receiving HH PT with last visit in September. He was working on balance exercises (tandem gait, gait with head turns, single leg balance) and LE strengthening (SLR, step ups). Pt was referred by physical medicine to a local counselor due to distress over his diagnoses and limitations and pt reports that it is going well. Pt was also referred by physical medicine to ENT for vertigo. He had a hearing test and was found to have bilateral hearing loss which pt also notices. Pt states that he has not had a VNG study. He is not having any more episodes of vertigo however as they have resolved. No falls in the last 6 months but pt reports that he has "come close." He has a shower chair which he uses when bathing. Pt has a treadmill, stationary bike, and weights at home but does not use exercise equipment at this time. Pt reports fluctuating energy levels with worsening energy throughout the day.    Limitations Walking    Diagnostic tests See history    Patient Stated Goals Improve balance, pt would like to be able to drive  Currently in Pain? No/denies    Pain Onset --              [facility] PT Assessment - 02/08/21 0858      Berg Balance Test   Sit to Stand Able to stand without using hands and stabilize independently    Standing Unsupported Able to stand safely 2 minutes    Sitting with Back Unsupported but Feet Supported on Floor or Stool Able to sit safely and securely 2 minutes    Stand to Sit Sits safely with minimal use of hands    Transfers Able to transfer safely, minor use of hands    Standing Unsupported with Eyes Closed Able to stand 10 seconds with supervision    Standing Unsupported with Feet Together Able to  place feet together independently and stand 1 minute safely    From Standing, Reach Forward with Outstretched Arm Can reach confidently >25 cm (10")    From Standing Position, Pick up Object from Floor Able to pick up shoe safely and easily    From Standing Position, Turn to Look Behind Over each Shoulder Looks behind from both sides and weight shifts well    Turn 360 Degrees Able to turn 360 degrees safely in 4 seconds or less    Standing Unsupported, Alternately Place Feet on Step/Stool Able to stand independently and safely and complete 8 steps in 20 seconds    Standing Unsupported, One Foot in Front Able to plae foot ahead of the other independently and hold 30 seconds    Standing on One Leg Able to lift leg independently and hold equal to or more than 3 seconds    Total Score 52      Dynamic Gait Index   Level Surface Normal    Change in Gait Speed Normal    Gait with Horizontal Head Turns Moderate Impairment    Gait with Vertical Head Turns Mild Impairment    Gait and Pivot Turn Mild Impairment    Step Over Obstacle Normal    Step Around Obstacles Normal    Steps Mild Impairment    Total Score 19            TREATMENT   Neuromuscular Re-education All balance exercises performed in // bars without upper extremity support unless otherwise indicated; Updated outcome measures and goals with patient: ABC: 22.5% BERG: 52/56 FOTO:  DGI: 19/24  Tandem gait  8' x 4lengths;   Ther-ex NuStep L2-4 x 5 minutes for warm-up during history, seat 14, hands 11 (3 minutes unbilled);  Standing exercises with 4# ankle weights BLE: Marching x 15; Hip abduction x 15; HS curls x 15; Hip extension x 15;  Alternating 6 inch step ups without upper extremity support x10 on each lower extremity;   Pt educated throughout session about proper posture and technique with exercises. Improved exercise technique, movement at target joints, use of target muscles after min to mod verbal,  visual, tactile cues.   Pt demonstrates excellent motivation during session today.Updated outcome measures and goals with patient during session.  His balance confidence has decreased from initial evaluation and was 22.5% today.  However his BERG improved from 50/56 at initial evaluation to 52/56 today.  His FOTO also improved from 37 initially to 39. DGI was 19/24 today. Continuedwith balance and strength exercisestoday and pt denies any increase in anterior R thigh pain or excessive BLE soreness. Utilized moist heat packs on anterior and posterior thighs between exercises to help with any residual  soreness.Will continue to modify therapy sessions to find appropriate level of intensity in order to maximize benefit and minimize fatigue.  Patient has yet to receive maximal benefit from physical therapy services and hewill benefit from additional PT services to address deficits in strength, balance, and mobility in order to return to full function at home.                        PT Short Term Goals - 02/08/21 0855      PT SHORT TERM GOAL #1   Title Pt will be independent with HEP in order to improve strength and balance in order to decrease fall risk and improve function at home.    Time 4    Period Weeks    Status Achieved    Target Date 01/31/21             PT Long Term Goals - 02/08/21 0855      PT LONG TERM GOAL #1   Title Pt will improve ABC by at least 13% in order to demonstrate clinically significant improvement in balance confidence    Baseline 01/03/21: 41.875%; 02/08/21: 22.5%    Time 8    Period Weeks    Status On-going    Target Date 02/28/21      PT LONG TERM GOAL #2   Title Pt will improve BERG by at least 3 points in order to demonstrate clinically significant improvement in balance.    Baseline 01/03/21: 50/56; 02/08/21: 52/56    Time 8    Period Weeks    Status On-going    Target Date 02/28/21      PT LONG TERM GOAL #3   Title Pt will  improve FOTO to at least 52 in order to demonstrate significant improvement in function    Baseline 01/03/21: 37; 02/08/21: 39;    Time 8    Period Weeks    Status New    Target Date 02/28/21                 Plan - 02/08/21 0855    Clinical Impression Statement Pt demonstrates excellent motivation during session today.  Updated outcome measures and goals with patient during session.  His balance confidence has decreased from initial evaluation and was 22.5% today.  However his BERG improved from 50/56 at initial evaluation to 52/56 today.  His FOTO also improved from 37 initially to 39. DGI was 19/24 today. Continued with balance and strength exercises today and pt denies any increase in anterior R thigh pain or excessive BLE soreness. Utilized moist heat packs on anterior and posterior thighs between exercises to help with any residual soreness.  Will continue to modify therapy sessions to find appropriate level of intensity in order to maximize benefit and minimize fatigue.  Patient has yet to receive maximal benefit from physical therapy services and he will benefit from additional PT services to address deficits in strength, balance, and mobility in order to return to full function at home.    Personal Factors and Comorbidities Comorbidity 3+;Time since onset of injury/illness/exacerbation    Comorbidities myelitis, s/p shoulder arthroscopic surgery, sarcoidosis, neuropathy    Examination-Activity Limitations Transfers    Examination-Participation Restrictions Community Activity;Driving;Shop;Yard Work    Stability/Clinical Decision Making Evolving/Moderate complexity    Rehab Potential Good    PT Frequency 2x / week    PT Duration 8 weeks    PT Treatment/Interventions ADLs/Self Care Home Management;Aquatic Therapy;Biofeedback;Canalith Repostioning;Cryotherapy;Electrical Stimulation;Iontophoresis  4mg /ml Dexamethasone;Moist Heat;Traction;Ultrasound;DME Instruction;Gait training;Stair  training;Functional mobility training;Therapeutic activities;Therapeutic exercise;Balance training;Neuromuscular re-education;Cognitive remediation;Patient/family education;Manual techniques;Passive range of motion;Dry needling;Vestibular;Joint Manipulations    PT Next Visit Plan review HEP, balance and strengthening    PT Home Exercise Plan Access Code: [email or web address]    Consulted and Agree with Plan of Care Patient           Patient will benefit from skilled therapeutic intervention in order to improve the following deficits and impairments:  Decreased balance,Impaired sensation,Decreased endurance  Visit Diagnosis: Unsteadiness on feet  Difficulty in walking, not elsewhere classified     Problem List Patient Active Problem List   Diagnosis Date Noted  . Myelitis (HCC) 06/21/2018  . On corticosteroid therapy 06/21/2018  . Sarcoidosis 06/17/2018  . Gait abnormality 02/25/2018  . Low serum thyroid stimulating hormone (TSH) 02/25/2018  . Serum gammaglobulin increased 02/25/2018  . Muscle weakness (generalized) 06/07/2014  . Closed fracture of glenoid cavity and neck of scapula 06/03/2014  . S/P arthroscopy of shoulder 06/03/2014  . Stiffness of joint, not elsewhere classified,  shoulder region 06/03/2014   William Lynn PT, DPT, GCS  William Lynn 02/08/2021, 9:49 AM  [place] [address] [place], [place], [address] Phone: [phone]   Fax:  [phone]  Name: William Lynn MRN: [id number] Date of Birth: [birth year]

## 2021-02-13 ENCOUNTER — Ambulatory Visit: Payer: Medicaid Other

## 2021-02-13 ENCOUNTER — Other Ambulatory Visit: Payer: Self-pay

## 2021-02-13 DIAGNOSIS — R262 Difficulty in walking, not elsewhere classified: Secondary | ICD-10-CM

## 2021-02-13 DIAGNOSIS — R2681 Unsteadiness on feet: Secondary | ICD-10-CM

## 2021-02-13 NOTE — Therapy (Signed)
[facility] [address] [place], [place], [address] Phone: [phone]   Fax:  [phone]  Physical Therapy Treatment  Patient Details  Name: Dayn Barich MRN: [id number] Date of Birth: 22-Oct-1961 Referring Provider (PT): Sena Hitch   Encounter Date: 02/13/2021   PT End of Session - 02/13/21 1001    Visit Number 11    Number of Visits 17    Date for PT Re-Evaluation 02/28/21    Authorization Type eval: 01/03/21    PT Start Time 0842    PT Stop Time 0930    PT Time Calculation (min) 48 min    Equipment Utilized During Treatment Gait belt    Activity Tolerance Patient tolerated treatment well    Behavior During Therapy [facility] for tasks assessed/performed           Past Medical History:  Diagnosis Date  . GERD (gastroesophageal reflux disease)   . Neuropathy   . Sarcoidosis     Past Surgical History:  Procedure Laterality Date  . HERNIA REPAIR    . KNEE SURGERY Right   . SHOULDER SURGERY Bilateral     There were no vitals filed for this visit.   Subjective Assessment - 02/13/21 0958    Subjective Patient reports that his legs were a little sore yesterday but no discomfort today upon arrival.  Otherwise no updates since last therapy session. No specific questions upon arrival.    Pertinent History Portions of history borrowed from EMR however all history was reviewed and confirmed with patient. Patient is a 60 y.o. year old male referred for mobility and balance deficits how has a diagnosis of neurosarcoidosis as well as myeltitis, for which he takes cellcept and prednisone. Pt denies any hopsital admissions related to these diagnoses. Major limitation is neuropathy which prevents him from working or driving extended distances. Neuropathy is in a stocking distribution that extends to the knees. He takes Lyrica and Cymbalta to help with this pain.  Recently had an MRI done that showed active spinal cord inflammation. Pt  was very disappointed by these findings and had stopped taking all his medications however has restarted at this time. Intermittent use of small base quad cane around the house and in the community. Was previously receiving HH PT with last visit in September. He was working on balance exercises (tandem gait, gait with head turns, single leg balance) and LE strengthening (SLR, step ups). Pt was referred by physical medicine to a local counselor due to distress over his diagnoses and limitations and pt reports that it is going well. Pt was also referred by physical medicine to ENT for vertigo. He had a hearing test and was found to have bilateral hearing loss which pt also notices. Pt states that he has not had a VNG study. He is not having any more episodes of vertigo however as they have resolved. No falls in the last 6 months but pt reports that he has "come close." He has a shower chair which he uses when bathing. Pt has a treadmill, stationary bike, and weights at home but does not use exercise equipment at this time. Pt reports fluctuating energy levels with worsening energy throughout the day.    Limitations Walking    Diagnostic tests See history    Patient Stated Goals Improve balance, pt would like to be able to drive    Currently in Pain? No/denies  TREATMENT   Neuromuscular Re-education All balance exercises performed in // bars without upper extremity support unless otherwise indicated; Airex balance beam tandem gait  8' x 6lengths; Airex balance beam side stepping 8' x 6 lengths;  BOSU static balance (round side up) 30s x 2; BOSU (round side up) mini squats x 10; BOSU static balance (flat side up) 30s x 2; BOSU (flat side up) mini squats x 10; Airex tandem balance alternating forward LE 30s x 2; Airex tandem balance alternating forward LE with horizontal head turns x 30s each;   Ther-ex SciFit L4 x 5 minutes for warm-up during history, unbilled;  Green  tband resisted side stepping 10' x 6 lengths; Sit to stand without UE support x 10;  Standing exercises with 4# ankle weights BLE: Marching x 15; Hip abduction x 15; HS curls x 15; Hip extension x 15;   Pt educated throughout session about proper posture and technique with exercises. Improved exercise technique, movement at target joints, use of target muscles after min to mod verbal, visual, tactile cues.   Pt demonstrates excellent motivation during session today.Continuedwith balance and strength exercisestoday. Utilized moist heat packs on anterior thighs between exercises to help with any residual soreness. Performed BOSU exercises today to challenge patient's stability on uneven surface. This is very challenging for the patient especially with the flat side up. Pt encouraged to continue HEP and follow-up as scheduled.Will continue to modify therapy sessions to find appropriate level of intensity in order to maximize benefit and minimize fatigue.He will benefit from PT services to address deficits in strength, balance, and mobility in order to return to full function at home.                            PT Short Term Goals - 02/08/21 0855      PT SHORT TERM GOAL #1   Title Pt will be independent with HEP in order to improve strength and balance in order to decrease fall risk and improve function at home.    Time 4    Period Weeks    Status Achieved    Target Date 01/31/21             PT Long Term Goals - 02/08/21 0855      PT LONG TERM GOAL #1   Title Pt will improve ABC by at least 13% in order to demonstrate clinically significant improvement in balance confidence    Baseline 01/03/21: 41.875%; 02/08/21: 22.5%    Time 8    Period Weeks    Status On-going    Target Date 02/28/21      PT LONG TERM GOAL #2   Title Pt will improve BERG by at least 3 points in order to demonstrate clinically significant improvement in balance.    Baseline  01/03/21: 50/56; 02/08/21: 52/56    Time 8    Period Weeks    Status On-going    Target Date 02/28/21      PT LONG TERM GOAL #3   Title Pt will improve FOTO to at least 52 in order to demonstrate significant improvement in function    Baseline 01/03/21: 37; 02/08/21: 39;    Time 8    Period Weeks    Status New    Target Date 02/28/21                 Plan - 02/13/21 1001    Clinical Impression Statement Pt  demonstrates excellent motivation during session today.  Continued with balance and strength exercises today. Utilized moist heat packs on anterior thighs between exercises to help with any residual soreness. Performed BOSU exercises today to challenge patient's stability on uneven surface. This is very challenging for the patient especially with the flat side up. Pt encouraged to continue HEP and follow-up as scheduled.   Will continue to modify therapy sessions to find appropriate level of intensity in order to maximize benefit and minimize fatigue. He will benefit from PT services to address deficits in strength, balance, and mobility in order to return to full function at home.    Personal Factors and Comorbidities Comorbidity 3+;Time since onset of injury/illness/exacerbation    Comorbidities myelitis, s/p shoulder arthroscopic surgery, sarcoidosis, neuropathy    Examination-Activity Limitations Transfers    Examination-Participation Restrictions Community Activity;Driving;Shop;Yard Work    Stability/Clinical Decision Making Evolving/Moderate complexity    Rehab Potential Good    PT Frequency 2x / week    PT Duration 8 weeks    PT Treatment/Interventions ADLs/Self Care Home Management;Aquatic Therapy;Biofeedback;Canalith Repostioning;Cryotherapy;Electrical Stimulation;Iontophoresis 4mg /ml Dexamethasone;Moist Heat;Traction;Ultrasound;DME Instruction;Gait training;Stair training;Functional mobility training;Therapeutic activities;Therapeutic exercise;Balance training;Neuromuscular  re-education;Cognitive remediation;Patient/family education;Manual techniques;Passive range of motion;Dry needling;Vestibular;Joint Manipulations    PT Next Visit Plan review HEP, balance and strengthening    PT Home Exercise Plan Access Code: [email or web address]    Consulted and Agree with Plan of Care Patient           Patient will benefit from skilled therapeutic intervention in order to improve the following deficits and impairments:  Decreased balance,Impaired sensation,Decreased endurance  Visit Diagnosis: Unsteadiness on feet  Difficulty in walking, not elsewhere classified     Problem List Patient Active Problem List   Diagnosis Date Noted  . Myelitis (HCC) 06/21/2018  . On corticosteroid therapy 06/21/2018  . Sarcoidosis 06/17/2018  . Gait abnormality 02/25/2018  . Low serum thyroid stimulating hormone (TSH) 02/25/2018  . Serum gammaglobulin increased 02/25/2018  . Muscle weakness (generalized) 06/07/2014  . Closed fracture of glenoid cavity and neck of scapula 06/03/2014  . S/P arthroscopy of shoulder 06/03/2014  . Stiffness of joint, not elsewhere classified,  shoulder region 06/03/2014   08/03/2014 PT, DPT, GCS  Lauraine Crespo 02/13/2021, 10:12 AM  [place] [address] [place], [place], [place] Phone: [phone]   Fax:  [phone]  Name: Cherry Wittwer MRN: Zadie Cleverly Date of Birth: 10-26-1961

## 2021-02-15 ENCOUNTER — Ambulatory Visit: Payer: Medicaid Other

## 2021-02-15 ENCOUNTER — Other Ambulatory Visit: Payer: Self-pay

## 2021-02-15 DIAGNOSIS — R2681 Unsteadiness on feet: Secondary | ICD-10-CM | POA: Diagnosis not present

## 2021-02-15 DIAGNOSIS — R262 Difficulty in walking, not elsewhere classified: Secondary | ICD-10-CM

## 2021-02-15 NOTE — Therapy (Signed)
[facility] [address] [place], [place], [address] Phone: [phone]   Fax:  [phone]  Physical Therapy Treatment  Patient Details  Name: William Lynn MRN: [id number] Date of Birth: 01/03/1961 Referring Provider (PT): Sena Hitch   Encounter Date: 02/15/2021   PT End of Session - 02/15/21 0907    Visit Number 12    Number of Visits 17    Date for PT Re-Evaluation 02/28/21    Authorization Type eval: 01/03/21    PT Start Time 0842    PT Stop Time 0927    PT Time Calculation (min) 45 min    Equipment Utilized During Treatment Gait belt    Activity Tolerance Patient tolerated treatment well    Behavior During Therapy [facility] for tasks assessed/performed           Past Medical History:  Diagnosis Date  . GERD (gastroesophageal reflux disease)   . Neuropathy   . Sarcoidosis     Past Surgical History:  Procedure Laterality Date  . HERNIA REPAIR    . KNEE SURGERY Right   . SHOULDER SURGERY Bilateral     There were no vitals filed for this visit.   Subjective Assessment - 02/15/21 0849    Subjective Patient reports that his legs were a little sore after the last therapy session but resolved by the following day.  Otherwise no updates since last therapy session. No pain upon arrival today. No specific questions upon arrival.    Pertinent History Portions of history borrowed from EMR however all history was reviewed and confirmed with patient. Patient is a 60 y.o. year old male referred for mobility and balance deficits how has a diagnosis of neurosarcoidosis as well as myeltitis, for which he takes cellcept and prednisone. Pt denies any hopsital admissions related to these diagnoses. Major limitation is neuropathy which prevents him from working or driving extended distances. Neuropathy is in a stocking distribution that extends to the knees. He takes Lyrica and Cymbalta to help with this pain.  Recently had an MRI done  that showed active spinal cord inflammation. Pt was very disappointed by these findings and had stopped taking all his medications however has restarted at this time. Intermittent use of small base quad cane around the house and in the community. Was previously receiving HH PT with last visit in September. He was working on balance exercises (tandem gait, gait with head turns, single leg balance) and LE strengthening (SLR, step ups). Pt was referred by physical medicine to a local counselor due to distress over his diagnoses and limitations and pt reports that it is going well. Pt was also referred by physical medicine to ENT for vertigo. He had a hearing test and was found to have bilateral hearing loss which pt also notices. Pt states that he has not had a VNG study. He is not having any more episodes of vertigo however as they have resolved. No falls in the last 6 months but pt reports that he has "come close." He has a shower chair which he uses when bathing. Pt has a treadmill, stationary bike, and weights at home but does not use exercise equipment at this time. Pt reports fluctuating energy levels with worsening energy throughout the day.    Limitations Walking    Diagnostic tests See history    Patient Stated Goals Improve balance, pt would like to be able to drive    Currently in Pain? No/denies  TREATMENT   Neuromuscular Re-education All balance exercises performed in // bars without upper extremity support unless otherwise indicated; Tandem gait  8' x 6lengths; Airex balance with balloon taps with SPT x 3 minutes; Soccer ball kicks x multiple bouts on each side;   Ther-ex NuStep L2-4 x 5 minutes for warm-up during history, 3 minutes unbilled;  Total Gym L22 double leg squats 2 x 15; Total Gym L22 single leg squats 2 x 15; Total Gym L22 double leg heel raises 3 x 10;  6" forward, lateral, and cross step-ups x 10 with each side    Pt educated throughout  session about proper posture and technique with exercises. Improved exercise technique, movement at target joints, use of target muscles after min to mod verbal, visual, tactile cues.   Pt demonstrates excellent motivation during session today.Continuedwith balance and strength exercisestoday. Utilized moist heat packs on anterior thighs between exercises to help with any residual soreness. More time spent today with patient working on strengthening. He does demonstrate increase in fatigue during session today. Spoke with wife on phone who is concerned about patients fatigue after therapy and advised her that there is a fine balance between appropriate and excessive fatigue. Pt encouraged to continue HEP and follow-up as scheduled.Will continue to modify therapy sessions to find appropriate level of intensity in order to maximize benefit and minimize fatigue.He will benefit from PT services to address deficits in strength, balance, and mobility in order to return to full function at home.                                   PT Short Term Goals - 02/08/21 0855      PT SHORT TERM GOAL #1   Title Pt will be independent with HEP in order to improve strength and balance in order to decrease fall risk and improve function at home.    Time 4    Period Weeks    Status Achieved    Target Date 01/31/21             PT Long Term Goals - 02/08/21 0855      PT LONG TERM GOAL #1   Title Pt will improve ABC by at least 13% in order to demonstrate clinically significant improvement in balance confidence    Baseline 01/03/21: 41.875%; 02/08/21: 22.5%    Time 8    Period Weeks    Status On-going    Target Date 02/28/21      PT LONG TERM GOAL #2   Title Pt will improve BERG by at least 3 points in order to demonstrate clinically significant improvement in balance.    Baseline 01/03/21: 50/56; 02/08/21: 52/56    Time 8    Period Weeks    Status On-going    Target  Date 02/28/21      PT LONG TERM GOAL #3   Title Pt will improve FOTO to at least 52 in order to demonstrate significant improvement in function    Baseline 01/03/21: 37; 02/08/21: 39;    Time 8    Period Weeks    Status New    Target Date 02/28/21                 Plan - 02/15/21 0915    Clinical Impression Statement Pt demonstrates excellent motivation during session today.  Continued with balance and strength exercises today. Utilized moist heat packs on  anterior thighs between exercises to help with any residual soreness. More time spent today with patient working on strengthening. He does demonstrate increase in fatigue during session today. Spoke with wife on phone who is concerned about patients fatigue after therapy and advised her that there is a fine balance between appropriate and excessive fatigue. Pt encouraged to continue HEP and follow-up as scheduled.   Will continue to modify therapy sessions to find appropriate level of intensity in order to maximize benefit and minimize fatigue. He will benefit from PT services to address deficits in strength, balance, and mobility in order to return to full function at home.    Personal Factors and Comorbidities Comorbidity 3+;Time since onset of injury/illness/exacerbation    Comorbidities myelitis, s/p shoulder arthroscopic surgery, sarcoidosis, neuropathy    Examination-Activity Limitations Transfers    Examination-Participation Restrictions Community Activity;Driving;Shop;Yard Work    Stability/Clinical Decision Making Evolving/Moderate complexity    Rehab Potential Good    PT Frequency 2x / week    PT Duration 8 weeks    PT Treatment/Interventions ADLs/Self Care Home Management;Aquatic Therapy;Biofeedback;Canalith Repostioning;Cryotherapy;Electrical Stimulation;Iontophoresis 4mg /ml Dexamethasone;Moist Heat;Traction;Ultrasound;DME Instruction;Gait training;Stair training;Functional mobility training;Therapeutic activities;Therapeutic  exercise;Balance training;Neuromuscular re-education;Cognitive remediation;Patient/family education;Manual techniques;Passive range of motion;Dry needling;Vestibular;Joint Manipulations    PT Next Visit Plan review HEP, balance and strengthening    PT Home Exercise Plan Access Code: [email or web address]    Consulted and Agree with Plan of Care Patient           Patient will benefit from skilled therapeutic intervention in order to improve the following deficits and impairments:  Decreased balance,Impaired sensation,Decreased endurance  Visit Diagnosis: Unsteadiness on feet  Difficulty in walking, not elsewhere classified     Problem List Patient Active Problem List   Diagnosis Date Noted  . Myelitis (HCC) 06/21/2018  . On corticosteroid therapy 06/21/2018  . Sarcoidosis 06/17/2018  . Gait abnormality 02/25/2018  . Low serum thyroid stimulating hormone (TSH) 02/25/2018  . Serum gammaglobulin increased 02/25/2018  . Muscle weakness (generalized) 06/07/2014  . Closed fracture of glenoid cavity and neck of scapula 06/03/2014  . S/P arthroscopy of shoulder 06/03/2014  . Stiffness of joint, not elsewhere classified,  shoulder region 06/03/2014   08/03/2014 PT, DPT, GCS  Roise Emert 02/15/2021, 3:07 PM  [place] [facility] [address] [place], [place], [place] Phone: [phone]   Fax:  [phone]  Name: Bhavesh Vazquez MRN: Zadie Cleverly Date of Birth: 27-Jan-1961

## 2021-02-20 ENCOUNTER — Other Ambulatory Visit: Payer: Self-pay

## 2021-02-20 ENCOUNTER — Ambulatory Visit: Payer: Medicaid Other

## 2021-02-20 DIAGNOSIS — R262 Difficulty in walking, not elsewhere classified: Secondary | ICD-10-CM

## 2021-02-20 DIAGNOSIS — R2681 Unsteadiness on feet: Secondary | ICD-10-CM

## 2021-02-20 NOTE — Therapy (Signed)
[facility] [address] [place], [place], [address] Phone: [phone]   Fax:  [phone]  Physical Therapy Treatment  Patient Details  Name: William Lynn MRN: [id number] Date of Birth: 11-Nov-1961 Referring Provider (PT): Sena Hitch   Encounter Date: 02/20/2021   PT End of Session - 02/20/21 1015    Visit Number 13    Number of Visits 17    Date for PT Re-Evaluation 02/28/21    Authorization Type eval: 01/03/21    PT Start Time 0940    PT Stop Time 1025    PT Time Calculation (min) 45 min    Equipment Utilized During Treatment Gait belt    Activity Tolerance Patient tolerated treatment well    Behavior During Therapy [facility] for tasks assessed/performed           Past Medical History:  Diagnosis Date  . GERD (gastroesophageal reflux disease)   . Neuropathy   . Sarcoidosis     Past Surgical History:  Procedure Laterality Date  . HERNIA REPAIR    . KNEE SURGERY Right   . SHOULDER SURGERY Bilateral     There were no vitals filed for this visit.   Subjective Assessment - 02/20/21 1040    Subjective Patient reports that he is doing well today. He denies any excessive soreness after last therapy session or any soreness upon arrival today. No updates since last therapy session. No specific questions upon arrival.    Pertinent History Portions of history borrowed from EMR however all history was reviewed and confirmed with patient. Patient is a 60 y.o. year old male referred for mobility and balance deficits how has a diagnosis of neurosarcoidosis as well as myeltitis, for which he takes cellcept and prednisone. Pt denies any hopsital admissions related to these diagnoses. Major limitation is neuropathy which prevents him from working or driving extended distances. Neuropathy is in a stocking distribution that extends to the knees. He takes Lyrica and Cymbalta to help with this pain.  Recently had an MRI done that showed  active spinal cord inflammation. Pt was very disappointed by these findings and had stopped taking all his medications however has restarted at this time. Intermittent use of small base quad cane around the house and in the community. Was previously receiving HH PT with last visit in September. He was working on balance exercises (tandem gait, gait with head turns, single leg balance) and LE strengthening (SLR, step ups). Pt was referred by physical medicine to a local counselor due to distress over his diagnoses and limitations and pt reports that it is going well. Pt was also referred by physical medicine to ENT for vertigo. He had a hearing test and was found to have bilateral hearing loss which pt also notices. Pt states that he has not had a VNG study. He is not having any more episodes of vertigo however as they have resolved. No falls in the last 6 months but pt reports that he has "come close." He has a shower chair which he uses when bathing. Pt has a treadmill, stationary bike, and weights at home but does not use exercise equipment at this time. Pt reports fluctuating energy levels with worsening energy throughout the day.    Limitations Walking    Diagnostic tests See history    Patient Stated Goals Improve balance, pt would like to be able to drive    Currently in Pain? No/denies  TREATMENT   Neuromuscular Re-education All balance exercises performed in // bars without upper extremity support unless otherwise indicated; Tandem gait  8' x 6lengths; Forward and lateral 12" hurdle step-overs x 10 each direction; Airex alternating cone taps x 10 each; Airex NBOS eyes closed 2 x 30s; Airex NBOS horizontal and vertical head turns x 30s each;   Ther-ex NuStep L2 x 5 minutes for warm-up during history, 4 minutes unbilled;  Standing exercises with 4# ankle weights (AW): Hip flexion marching x 15; Hip abduction x 15; HS curls x 15; Hip extension x 15;  Standing  heel raises x 15; Seated LAQ with 4# AW x 15;  Forward 6" step-ups without UE support alternating LE x 10 each; Lateral 6" step-ups without UE support x 10 leading with each leg;  Resisted side stepping in // bars with green tband around ankles x 6 lengths; Sit to stand from regular height chair without UE support x 10;   Pt educated throughout session about proper posture and technique with exercises. Improved exercise technique, movement at target joints, use of target muscles after min to mod verbal, visual, tactile cues.   Pt demonstrates excellent motivation during session today.Continuedwith balance and strength exercisestoday. Utilized moist heat packs on anterior thighs between exercises to help with any residual soreness. Progressed challenge for balance activities including Airex eyes closed and Airex with head turns. Pt encouraged to continue HEP and follow-up as scheduled.Will continue to modify therapy sessions to find appropriate level of intensity in order to maximize benefit and minimize fatigue.He will benefit from PT services to address deficits in strength, balance, and mobility in order to return to full function at home.                                  PT Short Term Goals - 02/08/21 0855      PT SHORT TERM GOAL #1   Title Pt will be independent with HEP in order to improve strength and balance in order to decrease fall risk and improve function at home.    Time 4    Period Weeks    Status Achieved    Target Date 01/31/21             PT Long Term Goals - 02/08/21 0855      PT LONG TERM GOAL #1   Title Pt will improve ABC by at least 13% in order to demonstrate clinically significant improvement in balance confidence    Baseline 01/03/21: 41.875%; 02/08/21: 22.5%    Time 8    Period Weeks    Status On-going    Target Date 02/28/21      PT LONG TERM GOAL #2   Title Pt will improve BERG by at least 3 points in order  to demonstrate clinically significant improvement in balance.    Baseline 01/03/21: 50/56; 02/08/21: 52/56    Time 8    Period Weeks    Status On-going    Target Date 02/28/21      PT LONG TERM GOAL #3   Title Pt will improve FOTO to at least 52 in order to demonstrate significant improvement in function    Baseline 01/03/21: 37; 02/08/21: 39;    Time 8    Period Weeks    Status New    Target Date 02/28/21  Plan - 02/20/21 1044    Clinical Impression Statement Pt demonstrates excellent motivation during session today.  Continued with balance and strength exercises today. Utilized moist heat packs on anterior thighs between exercises to help with any residual soreness. Progressed challenge for balance activities including Airex eyes closed and Airex with head turns. Pt encouraged to continue HEP and follow-up as scheduled.   Will continue to modify therapy sessions to find appropriate level of intensity in order to maximize benefit and minimize fatigue. He will benefit from PT services to address deficits in strength, balance, and mobility in order to return to full function at home.    Personal Factors and Comorbidities Comorbidity 3+;Time since onset of injury/illness/exacerbation    Comorbidities myelitis, s/p shoulder arthroscopic surgery, sarcoidosis, neuropathy    Examination-Activity Limitations Transfers    Examination-Participation Restrictions Community Activity;Driving;Shop;Yard Work    Stability/Clinical Decision Making Evolving/Moderate complexity    Rehab Potential Good    PT Frequency 2x / week    PT Duration 8 weeks    PT Treatment/Interventions ADLs/Self Care Home Management;Aquatic Therapy;Biofeedback;Canalith Repostioning;Cryotherapy;Electrical Stimulation;Iontophoresis 4mg /ml Dexamethasone;Moist Heat;Traction;Ultrasound;DME Instruction;Gait training;Stair training;Functional mobility training;Therapeutic activities;Therapeutic exercise;Balance  training;Neuromuscular re-education;Cognitive remediation;Patient/family education;Manual techniques;Passive range of motion;Dry needling;Vestibular;Joint Manipulations    PT Next Visit Plan review HEP, balance and strengthening    PT Home Exercise Plan Access Code: [email or web address]    Consulted and Agree with Plan of Care Patient           Patient will benefit from skilled therapeutic intervention in order to improve the following deficits and impairments:  Decreased balance,Impaired sensation,Decreased endurance  Visit Diagnosis: Unsteadiness on feet  Difficulty in walking, not elsewhere classified     Problem List Patient Active Problem List   Diagnosis Date Noted  . Myelitis (HCC) 06/21/2018  . On corticosteroid therapy 06/21/2018  . Sarcoidosis 06/17/2018  . Gait abnormality 02/25/2018  . Low serum thyroid stimulating hormone (TSH) 02/25/2018  . Serum gammaglobulin increased 02/25/2018  . Muscle weakness (generalized) 06/07/2014  . Closed fracture of glenoid cavity and neck of scapula 06/03/2014  . S/P arthroscopy of shoulder 06/03/2014  . Stiffness of joint, not elsewhere classified,  shoulder region 06/03/2014   08/03/2014 PT, DPT, GCS  William Lynn 02/20/2021, 11:07 AM  [facility] [address] [place], [place], [place] Phone: [phone]   Fax:  [phone]  Name: William Lynn MRN: Zadie Cleverly Date of Birth: 1961/12/05

## 2021-02-22 ENCOUNTER — Ambulatory Visit: Payer: Medicaid Other

## 2021-02-22 ENCOUNTER — Other Ambulatory Visit: Payer: Self-pay

## 2021-02-22 VITALS — BP 120/92 | HR 80

## 2021-02-22 DIAGNOSIS — R262 Difficulty in walking, not elsewhere classified: Secondary | ICD-10-CM

## 2021-02-22 DIAGNOSIS — R2681 Unsteadiness on feet: Secondary | ICD-10-CM

## 2021-02-22 NOTE — Therapy (Signed)
[facility] [address] [place], [place], [address] Phone: [phone]   Fax:  [phone]  Physical Therapy Treatment  Patient Details  Name: William Lynn MRN: [id number] Date of Birth: 1961/01/09 Referring Provider (PT): Sena Hitch   Encounter Date: 02/22/2021   PT End of Session - 02/22/21 0849    Visit Number 14    Number of Visits 17    Date for PT Re-Evaluation 02/28/21    Authorization Type eval: 01/03/21    PT Start Time 0846    PT Stop Time 0930    PT Time Calculation (min) 44 min    Equipment Utilized During Treatment Gait belt    Activity Tolerance Patient tolerated treatment well    Behavior During Therapy [facility] for tasks assessed/performed           Past Medical History:  Diagnosis Date  . GERD (gastroesophageal reflux disease)   . Neuropathy   . Sarcoidosis     Past Surgical History:  Procedure Laterality Date  . HERNIA REPAIR    . KNEE SURGERY Right   . SHOULDER SURGERY Bilateral     Vitals:   02/22/21 0922  BP: (!) 120/92  Pulse: 80  SpO2: 99%     Subjective Assessment - 02/22/21 0848    Subjective Patient reports that he is fatigued today. He denies any excessive soreness after last therapy session. Reports that his legs feel weak today. No updates since last therapy session. No specific questions upon arrival.    Pertinent History Portions of history borrowed from EMR however all history was reviewed and confirmed with patient. Patient is a 60 y.o. year old male referred for mobility and balance deficits how has a diagnosis of neurosarcoidosis as well as myeltitis, for which he takes cellcept and prednisone. Pt denies any hopsital admissions related to these diagnoses. Major limitation is neuropathy which prevents him from working or driving extended distances. Neuropathy is in a stocking distribution that extends to the knees. He takes Lyrica and Cymbalta to help with this pain.  Recently  had an MRI done that showed active spinal cord inflammation. Pt was very disappointed by these findings and had stopped taking all his medications however has restarted at this time. Intermittent use of small base quad cane around the house and in the community. Was previously receiving HH PT with last visit in September. He was working on balance exercises (tandem gait, gait with head turns, single leg balance) and LE strengthening (SLR, step ups). Pt was referred by physical medicine to a local counselor due to distress over his diagnoses and limitations and pt reports that it is going well. Pt was also referred by physical medicine to ENT for vertigo. He had a hearing test and was found to have bilateral hearing loss which pt also notices. Pt states that he has not had a VNG study. He is not having any more episodes of vertigo however as they have resolved. No falls in the last 6 months but pt reports that he has "come close." He has a shower chair which he uses when bathing. Pt has a treadmill, stationary bike, and weights at home but does not use exercise equipment at this time. Pt reports fluctuating energy levels with worsening energy throughout the day.    Limitations Walking    Diagnostic tests See history    Patient Stated Goals Improve balance, pt would like to be able to drive    Currently in Pain?  No/denies              TREATMENT   Neuromuscular Re-education All balance exercises performed in // bars without upper extremity support unless otherwise indicated; Tandem gait  8' x 6lengths;   Ther-ex NuStep L2-4 x 5 minutes for warm-up during history, 1 minutes unbilled;  Total Gym L22 double leg squats 2 x 20; Total Gym L22 single leg squats x 10 BLE; Vitals assessed and pt monitored for symptoms;   Pt educated throughout session about proper posture and technique with exercises. Improved exercise technique, movement at target joints, use of target muscles after min to  mod verbal, visual, tactile cues.   Pt is clearly fatigued during session today. After working on tandem gait in the parallel bars pt reports feeling dizzy and needs to sit down. Pt provided water to drink and dizziness gradually improves over the next 10-15 minutes. Pt reports nothing to eat or drink this AM prior to session. Vitals assessed which are normal with the exception of an irregularly irregular pulse. Rate is controlled. Pt denies history of arrhythmia. Pt encouraged to go to the urgent care for assessment but he refuses. Pt reports that he will follow-up with PCP. Session limited today due to dizziness. Pt encouraged to continue HEP and follow-up as scheduled.Will continue to modify therapy sessions to find appropriate level of intensity in order to maximize benefit and minimize fatigue.He will benefit from PT services to address deficits in strength, balance, and mobility in order to return to full function at home.                                PT Short Term Goals - 02/08/21 0855      PT SHORT TERM GOAL #1   Title Pt will be independent with HEP in order to improve strength and balance in order to decrease fall risk and improve function at home.    Time 4    Period Weeks    Status Achieved    Target Date 01/31/21             PT Long Term Goals - 02/08/21 0855      PT LONG TERM GOAL #1   Title Pt will improve ABC by at least 13% in order to demonstrate clinically significant improvement in balance confidence    Baseline 01/03/21: 41.875%; 02/08/21: 22.5%    Time 8    Period Weeks    Status On-going    Target Date 02/28/21      PT LONG TERM GOAL #2   Title Pt will improve BERG by at least 3 points in order to demonstrate clinically significant improvement in balance.    Baseline 01/03/21: 50/56; 02/08/21: 52/56    Time 8    Period Weeks    Status On-going    Target Date 02/28/21      PT LONG TERM GOAL #3   Title Pt will improve FOTO  to at least 52 in order to demonstrate significant improvement in function    Baseline 01/03/21: 37; 02/08/21: 39;    Time 8    Period Weeks    Status New    Target Date 02/28/21                 Plan - 02/22/21 0849    Clinical Impression Statement Pt is clearly fatigued during session today. After working on tandem gait in the parallel bars  pt reports feeling dizzy and needs to sit down. Pt provided water to drink and dizziness gradually improves over the next 10-15 minutes. Pt reports nothing to eat or drink this AM prior to session. Vitals assessed which are normal with the exception of an irregularly irregular pulse. Rate is controlled. Pt denies history of arrhythmia. Pt encouraged to go to the urgent care for assessment but he refuses. Pt reports that he will follow-up with PCP. Session limited today due to dizziness. Pt encouraged to continue HEP and follow-up as scheduled.   Will continue to modify therapy sessions to find appropriate level of intensity in order to maximize benefit and minimize fatigue. He will benefit from PT services to address deficits in strength, balance, and mobility in order to return to full function at home.    Personal Factors and Comorbidities Comorbidity 3+;Time since onset of injury/illness/exacerbation    Comorbidities myelitis, s/p shoulder arthroscopic surgery, sarcoidosis, neuropathy    Examination-Activity Limitations Transfers    Examination-Participation Restrictions Community Activity;Driving;Shop;Yard Work    Stability/Clinical Decision Making Evolving/Moderate complexity    Rehab Potential Good    PT Frequency 2x / week    PT Duration 8 weeks    PT Treatment/Interventions ADLs/Self Care Home Management;Aquatic Therapy;Biofeedback;Canalith Repostioning;Cryotherapy;Electrical Stimulation;Iontophoresis 4mg /ml Dexamethasone;Moist Heat;Traction;Ultrasound;DME Instruction;Gait training;Stair training;Functional mobility training;Therapeutic  activities;Therapeutic exercise;Balance training;Neuromuscular re-education;Cognitive remediation;Patient/family education;Manual techniques;Passive range of motion;Dry needling;Vestibular;Joint Manipulations    PT Next Visit Plan review HEP, balance and strengthening    PT Home Exercise Plan Access Code: [email or web address]    Consulted and Agree with Plan of Care Patient           Patient will benefit from skilled therapeutic intervention in order to improve the following deficits and impairments:  Decreased balance,Impaired sensation,Decreased endurance  Visit Diagnosis: Unsteadiness on feet  Difficulty in walking, not elsewhere classified     Problem List Patient Active Problem List   Diagnosis Date Noted  . Myelitis (HCC) 06/21/2018  . On corticosteroid therapy 06/21/2018  . Sarcoidosis 06/17/2018  . Gait abnormality 02/25/2018  . Low serum thyroid stimulating hormone (TSH) 02/25/2018  . Serum gammaglobulin increased 02/25/2018  . Muscle weakness (generalized) 06/07/2014  . Closed fracture of glenoid cavity and neck of scapula 06/03/2014  . S/P arthroscopy of shoulder 06/03/2014  . Stiffness of joint, not elsewhere classified,  shoulder region 06/03/2014    Augustino Savastano 02/22/2021, 2:53 PM  [facility] [address] [place], [place], [place] Phone: [phone]   Fax:  [phone]  Name: Shaheed Schmuck MRN: Zadie Cleverly Date of Birth: 02-07-1961

## 2021-02-27 ENCOUNTER — Ambulatory Visit: Payer: Medicaid Other

## 2021-03-06 ENCOUNTER — Ambulatory Visit: Payer: Medicaid Other | Attending: Physical Medicine and Rehabilitation

## 2021-03-06 ENCOUNTER — Other Ambulatory Visit: Payer: Self-pay

## 2021-03-06 DIAGNOSIS — R2681 Unsteadiness on feet: Secondary | ICD-10-CM | POA: Diagnosis present

## 2021-03-06 DIAGNOSIS — R262 Difficulty in walking, not elsewhere classified: Secondary | ICD-10-CM | POA: Insufficient documentation

## 2021-03-06 NOTE — Therapy (Signed)
[facility] [address] [place], [place], [address] Phone: [phone]   Fax:  [phone]  Physical Therapy Treatment/Discharge  Patient Details  Name: William Lynn MRN: [id number] Date of Birth: 1961-03-31 Referring Provider (PT): Marshell Levan   Encounter Date: 03/06/2021   PT End of Session - 03/06/21 0906    Visit Number 15    Number of Visits 19    Date for PT Re-Evaluation 03/13/21    Authorization Type eval: 01/03/21    PT Start Time 0847    PT Stop Time 0930    PT Time Calculation (min) 43 min    Equipment Utilized During Treatment Gait belt    Activity Tolerance Patient tolerated treatment well    Behavior During Therapy [facility] for tasks assessed/performed           Past Medical History:  Diagnosis Date  . GERD (gastroesophageal reflux disease)   . Neuropathy   . Sarcoidosis     Past Surgical History:  Procedure Laterality Date  . HERNIA REPAIR    . KNEE SURGERY Right   . SHOULDER SURGERY Bilateral     There were no vitals filed for this visit.   Subjective Assessment - 03/06/21 0856    Subjective Patient reports that he is doing alright today. He denies any excessive soreness after last therapy session. He saw his MD the day following his last therapy visit and they performed an EKG but by that time his rythym was normal.  They advised him to return immediately for an EKG if his symptoms return.  No specific questions upon arrival.    Pertinent History Portions of history borrowed from EMR however all history was reviewed and confirmed with patient. Patient is a 60 y.o. year old male referred for mobility and balance deficits how has a diagnosis of neurosarcoidosis as well as myeltitis, for which he takes cellcept and prednisone. Pt denies any hopsital admissions related to these diagnoses. Major limitation is neuropathy which prevents him from working or driving extended distances. Neuropathy is in a  stocking distribution that extends to the knees. He takes Lyrica and Cymbalta to help with this pain.  Recently had an MRI done that showed active spinal cord inflammation. Pt was very disappointed by these findings and had stopped taking all his medications however has restarted at this time. Intermittent use of small base quad cane around the house and in the community. Was previously receiving [facility] with last visit in September. He was working on balance exercises (tandem gait, gait with head turns, single leg balance) and LE strengthening (SLR, step ups). Pt was referred by physical medicine to a local counselor due to distress over his diagnoses and limitations and pt reports that it is going well. Pt was also referred by physical medicine to ENT for vertigo. He had a hearing test and was found to have bilateral hearing loss which pt also notices. Pt states that he has not had a VNG study. He is not having any more episodes of vertigo however as they have resolved. No falls in the last 6 months but pt reports that he has "come close." He has a shower chair which he uses when bathing. Pt has a treadmill, stationary bike, and weights at home but does not use exercise equipment at this time. Pt reports fluctuating energy levels with worsening energy throughout the day.    Limitations Walking    Diagnostic tests See history    Patient  Stated Goals Improve balance, pt would like to be able to drive    Currently in Pain? No/denies              [facility] PT Assessment - 03/06/21 0926      Standardized Balance Assessment   Standardized Balance Assessment Berg Balance Test;Dynamic Gait Index      Berg Balance Test   Sit to Stand Able to stand without using hands and stabilize independently    Standing Unsupported Able to stand safely 2 minutes    Sitting with Back Unsupported but Feet Supported on Floor or Stool Able to sit safely and securely 2 minutes    Stand to Sit Sits safely with minimal use of  hands    Transfers Able to transfer safely, minor use of hands    Standing Unsupported with Eyes Closed Able to stand 10 seconds safely    Standing Unsupported with Feet Together Able to place feet together independently and stand 1 minute safely    From Standing, Reach Forward with Outstretched Arm Can reach confidently >25 cm (10")    From Standing Position, Pick up Object from Floor Able to pick up shoe safely and easily    From Standing Position, Turn to Look Behind Over each Shoulder Looks behind from both sides and weight shifts well    Turn 360 Degrees Able to turn 360 degrees safely in 4 seconds or less    Standing Unsupported, Alternately Place Feet on Step/Stool Able to stand independently and safely and complete 8 steps in 20 seconds    Standing Unsupported, One Foot in Front Able to place foot tandem independently and hold 30 seconds    Standing on One Leg Able to lift leg independently and hold equal to or more than 3 seconds    Total Score 54      Dynamic Gait Index   Level Surface Normal    Change in Gait Speed Normal    Gait with Horizontal Head Turns Mild Impairment    Gait with Vertical Head Turns Mild Impairment    Gait and Pivot Turn Mild Impairment    Step Over Obstacle Normal    Step Around Obstacles Normal    Steps Mild Impairment    Total Score 20           TREATMENT   Ther-ex  NuStep L2-4 x 5 minutes for warm-up during history, seat 14, hands 11 (3 minutes unbilled); Total Gym L22 double leg squats x 10; Total Gym L22 single leg squats x 10 BLE; Total Gym L22 single leg heel raises x 20 BLE;  Updated outcome measures and goals with patient: ABC: 35% BERG: 54/56 FOTO: 39 DGI: 20/24  Reviewed HEP and discharge instructions;   Pt demonstrates excellent motivation during session today.Updated outcome measures and goals with patient during session.  His balance confidence has improved from last updated and is 35% however is still below what he  reported at his initial evaluation. His FOTO score remained unchanged at 39. However, his BERG improved from 50/56 at initial evaluation to 54/56 today which is also 2 points higher than when it was last updated. His DGI has also improved to 20/24. Pt has achieved maximum benefit from PT services currently and will be discharged at this time. Reviewed HEP and discharge instructions. Pt encouraged to follow-up if he is concerned about regression of his strength/balance or would like reassessment in the future.  PT Short Term Goals - 03/06/21 0907      PT SHORT TERM GOAL #1   Title Pt will be independent with HEP in order to improve strength and balance in order to decrease fall risk and improve function at home.    Time 4    Period Weeks    Status Achieved    Target Date 01/31/21             PT Long Term Goals - 03/06/21 0908      PT LONG TERM GOAL #1   Title Pt will improve ABC by at least 13% in order to demonstrate clinically significant improvement in balance confidence    Baseline 01/03/21: 41.875%; 02/08/21: 22.5%; 03/06/21: 35%    Time 8    Period Weeks    Status Not Met    Target Date --      PT LONG TERM GOAL #2   Title Pt will improve BERG by at least 3 points in order to demonstrate clinically significant improvement in balance.    Baseline 01/03/21: 50/56; 02/08/21: 52/56; 03/07/21: 54/56    Time 8    Period Weeks    Status Achieved    Target Date --      PT LONG TERM GOAL #3   Title Pt will improve FOTO to at least 52 in order to demonstrate significant improvement in function    Baseline 01/03/21: 37; 02/08/21: 39; 03/06/21: 39    Time 8    Period Weeks    Status Not Met                 Plan - 03/06/21 0907    Clinical Impression Statement Pt demonstrates excellent motivation during session today.  Updated outcome measures and goals with patient during session.  His balance confidence has improved from last updated  and is 35% however is still below what he reported at his initial evaluation. His FOTO score remained unchanged at 39. However, his BERG improved from 50/56 at initial evaluation to 54/56 today which is also 2 points higher than when it was last updated. His DGI has also improved to 20/24. Pt has achieved maximum benefit from PT services currently and will be discharged at this time. Reviewed HEP and discharge instructions. Pt encouraged to follow-up if he is concerned about regression of his strength/balance or would like reassessment in the future.    Personal Factors and Comorbidities Comorbidity 3+;Time since onset of injury/illness/exacerbation    Comorbidities myelitis, s/p shoulder arthroscopic surgery, sarcoidosis, neuropathy    Examination-Activity Limitations Transfers    Examination-Participation Restrictions Community Activity;Driving;Shop;Yard Work    Stability/Clinical Decision Making Evolving/Moderate complexity    Rehab Potential Good    PT Frequency 2x / week    PT Duration --   1 week   PT Treatment/Interventions ADLs/Self Care Home Management;Aquatic Therapy;Biofeedback;Canalith Repostioning;Cryotherapy;Electrical Stimulation;Iontophoresis 52m/ml Dexamethasone;Moist Heat;Traction;Ultrasound;DME Instruction;Gait training;Stair training;Functional mobility training;Therapeutic activities;Therapeutic exercise;Balance training;Neuromuscular re-education;Cognitive remediation;Patient/family education;Manual techniques;Passive range of motion;Dry needling;Vestibular;Joint Manipulations    PT Next Visit Plan Discharge    PT Home Exercise Plan Access Code: [email or web address]    Consulted and Agree with Plan of Care Patient           Patient will benefit from skilled therapeutic intervention in order to improve the following deficits and impairments:  Decreased balance,Impaired sensation,Decreased endurance  Visit Diagnosis: Unsteadiness on feet  Difficulty in walking, not elsewhere  classified     Problem List Patient Active Problem List   Diagnosis Date Noted  .  Myelitis ([place]) 06/21/2018  . On corticosteroid therapy 06/21/2018  . Sarcoidosis 06/17/2018  . Gait abnormality 02/25/2018  . Low serum thyroid stimulating hormone (TSH) 02/25/2018  . Serum gammaglobulin increased 02/25/2018  . Muscle weakness (generalized) 06/07/2014  . Closed fracture of glenoid cavity and neck of scapula 06/03/2014  . S/P arthroscopy of shoulder 06/03/2014  . Stiffness of joint, not elsewhere classified,  shoulder region 06/03/2014   Phillips Grout PT, DPT, GCS  William Lynn 03/07/2021, 11:33 AM  [facility] [address] [place], [place], [address] Phone: [phone]   Fax:  [phone]  Name: William Lynn MRN: [id number] Date of Birth: [birth year]

## 2021-03-08 ENCOUNTER — Ambulatory Visit: Payer: Medicaid Other

## 2021-10-13 ENCOUNTER — Emergency Department: Payer: Medicaid Other

## 2021-10-13 ENCOUNTER — Observation Stay
Admission: EM | Admit: 2021-10-13 | Discharge: 2021-10-14 | Disposition: A | Discharge status: Home / Self Care | Payer: Medicaid Other | Attending: Obstetrics and Gynecology | Admitting: Obstetrics and Gynecology

## 2021-10-13 ENCOUNTER — Other Ambulatory Visit: Payer: Self-pay

## 2021-10-13 DIAGNOSIS — I639 Cerebral infarction, unspecified: Principal | ICD-10-CM | POA: Insufficient documentation

## 2021-10-13 DIAGNOSIS — Z20822 Contact with and (suspected) exposure to covid-19: Secondary | ICD-10-CM | POA: Insufficient documentation

## 2021-10-13 DIAGNOSIS — G459 Transient cerebral ischemic attack, unspecified: Secondary | ICD-10-CM | POA: Diagnosis not present

## 2021-10-13 DIAGNOSIS — D8689 Sarcoidosis of other sites: Secondary | ICD-10-CM

## 2021-10-13 DIAGNOSIS — R2990 Unspecified symptoms and signs involving the nervous system: Secondary | ICD-10-CM

## 2021-10-13 DIAGNOSIS — G373 Acute transverse myelitis in demyelinating disease of central nervous system: Secondary | ICD-10-CM

## 2021-10-13 DIAGNOSIS — R42 Dizziness and giddiness: Secondary | ICD-10-CM | POA: Diagnosis present

## 2021-10-13 DIAGNOSIS — Z87891 Personal history of nicotine dependence: Secondary | ICD-10-CM | POA: Diagnosis not present

## 2021-10-13 LAB — URINALYSIS, COMPLETE (UACMP) WITH MICROSCOPIC
Bacteria, UA: NONE SEEN
Bilirubin Urine: NEGATIVE
Glucose, UA: NEGATIVE mg/dL
Hgb urine dipstick: NEGATIVE
Ketones, ur: NEGATIVE mg/dL
Leukocytes,Ua: NEGATIVE
Nitrite: NEGATIVE
Protein, ur: NEGATIVE mg/dL
Specific Gravity, Urine: 1.004 — ABNORMAL LOW (ref 1.005–1.030)
Squamous Epithelial / HPF: NONE SEEN (ref 0–5)
pH: 7 (ref 5.0–8.0)

## 2021-10-13 LAB — COMPREHENSIVE METABOLIC PANEL
ALT: 29 U/L (ref 0–44)
AST: 39 U/L (ref 15–41)
Albumin: 4.6 g/dL (ref 3.5–5.0)
Alkaline Phosphatase: 76 U/L (ref 38–126)
Anion gap: 12 (ref 5–15)
BUN: 16 mg/dL (ref 6–20)
CO2: 28 mmol/L (ref 22–32)
Calcium: 9.7 mg/dL (ref 8.9–10.3)
Chloride: 100 mmol/L (ref 98–111)
Creatinine, Ser: 1.36 mg/dL — ABNORMAL HIGH (ref 0.61–1.24)
GFR, Estimated: 60 mL/min — ABNORMAL LOW (ref >60)
Glucose, Bld: 92 mg/dL (ref 70–99)
Potassium: 3.7 mmol/L (ref 3.5–5.1)
Sodium: 140 mmol/L (ref 135–145)
Total Bilirubin: 1.8 mg/dL — ABNORMAL HIGH (ref 0.3–1.2)
Total Protein: 7.8 g/dL (ref 6.5–8.1)

## 2021-10-13 LAB — CBC
HCT: 43.4 % (ref 39.0–52.0)
Hemoglobin: 14.2 g/dL (ref 13.0–17.0)
MCH: 30 pg (ref 26.0–34.0)
MCHC: 32.7 g/dL (ref 30.0–36.0)
MCV: 91.6 fL (ref 80.0–100.0)
Platelets: 182 10*3/uL (ref 150–400)
RBC: 4.74 MIL/uL (ref 4.22–5.81)
RDW: 15.3 % (ref 11.5–15.5)
WBC: 7.8 10*3/uL (ref 4.0–10.5)
nRBC: 0 % (ref 0.0–0.2)

## 2021-10-13 LAB — DIFFERENTIAL
Abs Immature Granulocytes: 0.04 10*3/uL (ref 0.00–0.07)
Basophils Absolute: 0.1 10*3/uL (ref 0.0–0.1)
Basophils Relative: 1 %
Eosinophils Absolute: 0.2 10*3/uL (ref 0.0–0.5)
Eosinophils Relative: 3 %
Immature Granulocytes: 1 %
Lymphocytes Relative: 18 %
Lymphs Abs: 1.4 10*3/uL (ref 0.7–4.0)
Monocytes Absolute: 0.6 10*3/uL (ref 0.1–1.0)
Monocytes Relative: 7 %
Neutro Abs: 5.5 10*3/uL (ref 1.7–7.7)
Neutrophils Relative %: 70 %

## 2021-10-13 LAB — APTT: aPTT: 29 seconds (ref 24–36)

## 2021-10-13 LAB — CBG MONITORING, ED: Glucose-Capillary: 98 mg/dL (ref 70–99)

## 2021-10-13 LAB — PROTIME-INR
INR: 1 (ref 0.8–1.2)
Prothrombin Time: 13.5 seconds (ref 11.4–15.2)

## 2021-10-13 MED ORDER — DAPSONE 100 MG PO TABS
100.0000 mg | ORAL_TABLET | Freq: Every day | ORAL | Status: DC
  Administered 2021-10-14: 100 mg via ORAL
  Filled 2021-10-13 (×2): qty 1

## 2021-10-13 MED ORDER — ACETAMINOPHEN 650 MG RE SUPP: 650.0000 mg | RECTAL | Status: DC | PRN

## 2021-10-13 MED ORDER — STROKE: EARLY STAGES OF RECOVERY BOOK: Freq: Once | Status: DC

## 2021-10-13 MED ORDER — ACETAMINOPHEN 325 MG PO TABS
650.0000 mg | ORAL_TABLET | ORAL | Status: DC | PRN
  Administered 2021-10-14: 650 mg via ORAL
  Filled 2021-10-13: qty 2

## 2021-10-13 MED ORDER — PANTOPRAZOLE SODIUM 20 MG PO TBEC
20.0000 mg | DELAYED_RELEASE_TABLET | Freq: Every day | ORAL | Status: DC
  Administered 2021-10-14: 20 mg via ORAL
  Filled 2021-10-13: qty 1

## 2021-10-13 MED ORDER — ENOXAPARIN SODIUM 40 MG/0.4ML IJ SOSY
40.0000 mg | PREFILLED_SYRINGE | INTRAMUSCULAR | Status: DC
  Administered 2021-10-13: 40 mg via SUBCUTANEOUS
  Filled 2021-10-13: qty 0.4

## 2021-10-13 MED ORDER — IOHEXOL 350 MG/ML SOLN
75.0000 mL | Freq: Once | INTRAVENOUS | Status: AC | PRN
  Administered 2021-10-13: 75 mL via INTRAVENOUS

## 2021-10-13 MED ORDER — SODIUM CHLORIDE 0.9% FLUSH
3.0000 mL | Freq: Once | INTRAVENOUS | Status: DC
Start: 2021-10-13 — End: 2021-10-13

## 2021-10-13 MED ORDER — PREGABALIN 25 MG PO CAPS
75.0000 mg | ORAL_CAPSULE | Freq: Every morning | ORAL | Status: DC
  Administered 2021-10-14: 75 mg via ORAL
  Filled 2021-10-13: qty 3

## 2021-10-13 MED ORDER — ACETAMINOPHEN 160 MG/5ML PO SOLN
650.0000 mg | ORAL | Status: DC | PRN
  Filled 2021-10-13: qty 20.3

## 2021-10-13 MED ORDER — TIZANIDINE HCL 2 MG PO TABS
2.0000 mg | ORAL_TABLET | Freq: Three times a day (TID) | ORAL | Status: DC
  Administered 2021-10-14 (×2): 2 mg via ORAL
  Filled 2021-10-13 (×2): qty 1

## 2021-10-13 MED ORDER — PREGABALIN 25 MG PO CAPS
150.0000 mg | ORAL_CAPSULE | Freq: Every day | ORAL | Status: DC
  Administered 2021-10-14: 150 mg via ORAL
  Filled 2021-10-13: qty 6

## 2021-10-13 MED ORDER — PREDNISONE 10 MG PO TABS
10.0000 mg | ORAL_TABLET | Freq: Every day | ORAL | Status: DC
  Administered 2021-10-14: 10 mg via ORAL
  Filled 2021-10-13: qty 1

## 2021-10-13 NOTE — H&P (Signed)
History and Physical    William Lynn UXN:[id number] DOB: December 13, 1961 DOA: 10/13/2021  PCP: [facility]   Patient coming from: home  I have personally briefly reviewed patient's old medical records in [facility]  Chief Complaint: episode of dizziness and loss of balance  HPI: William Lynn is a 60 y.o. 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  The symptoms lasted about 30 minutes and then resolved on its own and patient feels back to his baseline.  He denies other complaints.  He denies recent cough fever or chills.  Denies headache, nausea or vomiting, denies abdominal pain, diarrhea.  ED course: Vitals within normal limits except for slightly elevated blood pressure of 144/94 Blood work mostly unremarkable  EKG, personally viewed and interpreted: Sinus rhythm at 75 with no acute ST-T wave changes  Imaging CT head and CT angio head and neck with and without contrast both normal  Patient was evaluated by neurology in the ED with diagnosis of possible TIA superimposed on chronic neurologic deficits related to neurosarcoidosis.  Hospitalist consulted for admission. Review of Systems: As per HPI otherwise all other systems on review of systems negative.    Past Medical History:  Diagnosis Date   GERD (gastroesophageal reflux disease)    Neuropathy    Sarcoidosis     Past Surgical History:  Procedure Laterality Date   HERNIA REPAIR     KNEE SURGERY Right    SHOULDER SURGERY Bilateral      reports that he quit  smoking about 31 years ago. His smoking use included cigarettes. He has never used smokeless tobacco. He reports that he does not drink alcohol and does not use drugs.  No Known Allergies  Family History  Problem Relation Age of Onset   Other Mother        unknown medical history   Other Father        unknown medical history      Prior to Admission medications   Medication Sig Start Date End Date Taking? Authorizing Provider  cetirizine (ZYRTEC) 10 MG tablet TAKE 1 TABLET BY MOUTH EVERY DAY EVERY NIGHT 11/02/18   [provider]  Cholecalciferol (VITAMIN D3) 75 MCG (3000 UT) TABS Take by mouth.    [provider]  clobetasol ointment (TEMOVATE) 0.05 % APPLY TOPICALLY TWO (2) TIMES A DAY. FOR RASHES, LARGE BSA 01/13/19   [provider]  cyclobenzaprine (FLEXERIL) 10 MG tablet Take 1 tablet (10 mg total) by mouth 2 (two) times daily as needed for muscle spasms. Do not drive while taking as can cause drowsiness 05/12/20   Renford Dills, NP  diazepam (VALIUM) 10 MG tablet TAKE 1 TABLET BY MOUTH ONCE FOR ANXIETY 30 MINUTES BEFORE MRI, AND 1 TAB AT THE TIME OF THE MRI 07/10/18   [provider]  DULoxetine (CYMBALTA) 30 MG capsule Take 30 mg by mouth daily. 05/02/20   [provider]  famotidine (PEPCID) 20 MG tablet Take 20 mg by mouth 2 (two) times daily. 02/25/20   [provider]  loratadine (CLARITIN) 10 MG tablet TAKE 10 MG  BY MOUTH NIGHTLY. AS NEEDED FOR ITCHING 01/12/19   [provider]  methylPREDNISolone (MEDROL DOSEPAK) 4 MG TBPK tablet as directed 06/11/18   [provider]  mycophenolate (CELLCEPT) 500 MG tablet Take 1,000 mg by mouth 2 (two) times daily. 04/19/20   [provider]  oxyCODONE (OXY IR/ROXICODONE) 5 MG immediate release tablet  10/04/10   [provider]  pantoprazole (PROTONIX) 20 MG tablet TAKE 1 TABLET (20 MG TOTAL) BY MOUTH DAILY. 30 MIN BEFORE BREAKFAST. 11/03/18   [provider]  pantoprazole (PROTONIX) 40 MG tablet TAKE 1 TABLET (40 MG TOTAL) BY MOUTH DAILY. 30 MIN BEFORE BREAKFAST. DURING STEROID TREATMENT. 01/12/19   [provider]  predniSONE (DELTASONE) 20 MG tablet TAKE 40 MG/DAY FOR A MONTH, UNTIL YOU SEE DERMATOLOGY. 01/12/19   [provider]  pregabalin (LYRICA) 75 MG capsule Take 75 mg by mouth 2 (two) times daily. 04/17/20   [provider]  prochlorperazine (COMPAZINE) 10 MG tablet TAKE 1 TABLET BY MOUTH TWICE A DAY AS NEEDED NAUSEA 06/11/18   [provider]  sulfamethoxazole-trimethoprim (BACTRIM,SEPTRA) 400-80 MG tablet Take by mouth. 09/13/18   [provider]    Physical Exam: Vitals:   10/13/21 1744 10/13/21 1807 10/13/21 1813 10/13/21 1921  BP:   (!) 147/80 (!) 153/84  Pulse:   69 71  Resp:   11 16  Temp:      TempSrc:      SpO2:   96% 96%  Weight: 97.5 kg 105.2 kg    Height: 6\' 5"  (1.956 m)        Vitals:   10/13/21 1744 10/13/21 1807 10/13/21 1813 10/13/21 1921  BP:   (!) 147/80 (!) 153/84  Pulse:   69 71  Resp:   11 16  Temp:      TempSrc:      SpO2:   96% 96%  Weight: 97.5 kg 105.2 kg    Height: 6\' 5"  (1.956 m)         Constitutional: Alert and oriented x 3 . Not in any apparent distress HEENT:      Head: Normocephalic and atraumatic.         Eyes: PERLA, EOMI, Conjunctivae are normal. Sclera is non-icteric.       Mouth/Throat: Mucous membranes are moist.       Neck: Supple with no signs of meningismus. Cardiovascular: Regular rate and rhythm. No murmurs, gallops, or rubs. 2+ symmetrical distal pulses are present . No JVD. No LE edema Respiratory: Respiratory effort normal .Lungs sounds clear bilaterally. No wheezes, crackles, or rhonchi.  Gastrointestinal: Soft, non tender, and non distended with positive bowel sounds.  Genitourinary: No CVA tenderness. Musculoskeletal: Nontender with normal range of motion in all extremities. No cyanosis, or erythema of  extremities. Neurologic:  Face is symmetric. Moving all extremities. No gross focal neurologic deficits beyond baseline left-sided weakness. Skin: Skin is warm, dry.  No rash or ulcers Psychiatric: Mood and affect are normal    Labs on Admission: I have personally reviewed following labs and imaging studies  CBC: Recent Labs  Lab 10/13/21 1746  WBC 7.8  NEUTROABS 5.5  HGB 14.2  HCT 43.4  MCV 91.6  PLT 182   Basic Metabolic Panel: Recent Labs  Lab 10/13/21 1746  NA 140  K 3.7  CL 100  CO2 28  GLUCOSE 92  BUN 16  CREATININE 1.36*  CALCIUM 9.7   GFR: Estimated Creatinine Clearance: 73.7 mL/min (A) (by C-G formula  based on SCr of 1.36 mg/dL (H)). Liver Function Tests: Recent Labs  Lab 10/13/21 1746  AST 39  ALT 29  ALKPHOS 76  BILITOT 1.8*  PROT 7.8  ALBUMIN 4.6   No results for input(s): LIPASE, AMYLASE in the last 168 hours. No results for input(s): AMMONIA in the last 168 hours. Coagulation Profile: Recent Labs  Lab 10/13/21 1919  INR 1.0   Cardiac Enzymes: No results for input(s): CKTOTAL, CKMB, CKMBINDEX, TROPONINI in the last 168 hours. BNP (last 3 results) No results for input(s): PROBNP in the last 8760 hours. HbA1C: No results for input(s): HGBA1C in the last 72 hours. CBG: Recent Labs  Lab 10/13/21 1757  GLUCAP 98   Lipid Profile: No results for input(s): CHOL, HDL, LDLCALC, TRIG, CHOLHDL, LDLDIRECT in the last 72 hours. Thyroid Function Tests: No results for input(s): TSH, T4TOTAL, FREET4, T3FREE, THYROIDAB in the last 72 hours. Anemia Panel: No results for input(s): VITAMINB12, FOLATE, FERRITIN, TIBC, IRON, RETICCTPCT in the last 72 hours. Urine analysis:    Component Value Date/Time   COLORURINE YELLOW (A) 10/13/2021 1919   APPEARANCEUR CLEAR (A) 10/13/2021 1919   LABSPEC 1.004 (L) 10/13/2021 1919   PHURINE 7.0 10/13/2021 1919   GLUCOSEU NEGATIVE 10/13/2021 1919   HGBUR NEGATIVE 10/13/2021 1919   BILIRUBINUR NEGATIVE 10/13/2021  1919   KETONESUR NEGATIVE 10/13/2021 1919   PROTEINUR NEGATIVE 10/13/2021 1919   NITRITE NEGATIVE 10/13/2021 1919   LEUKOCYTESUR NEGATIVE 10/13/2021 1919    Radiological Exams on Admission: CT ANGIO HEAD NECK W WO CM  Result Date: 10/13/2021 CLINICAL DATA:  Acute onset dizziness EXAM: CT ANGIOGRAPHY HEAD AND NECK TECHNIQUE: Multidetector CT imaging of the head and neck was performed using the standard protocol during bolus administration of intravenous contrast. Multiplanar CT image reconstructions and MIPs were obtained to evaluate the vascular anatomy. Carotid stenosis measurements (when applicable) are obtained utilizing NASCET criteria, using the distal internal carotid diameter as the denominator. CONTRAST:  88mL OMNIPAQUE IOHEXOL 350 MG/ML SOLN COMPARISON:  None. FINDINGS: CTA NECK FINDINGS SKELETON: There is no bony spinal canal stenosis. No lytic or blastic lesion. OTHER NECK: Normal pharynx, larynx and major salivary glands. No cervical lymphadenopathy. Unremarkable thyroid gland. UPPER CHEST: No pneumothorax or pleural effusion. No nodules or masses. AORTIC ARCH: There is no calcific atherosclerosis of the aortic arch. There is no aneurysm, dissection or hemodynamically significant stenosis of the visualized portion of the aorta. Conventional 3 vessel aortic branching pattern. The visualized proximal subclavian arteries are widely patent. RIGHT CAROTID SYSTEM: Normal without aneurysm, dissection or stenosis. LEFT CAROTID SYSTEM: Normal without aneurysm, dissection or stenosis. VERTEBRAL ARTERIES: Left dominant configuration. Both origins are clearly patent. There is no dissection, occlusion or flow-limiting stenosis to the skull base (V1-V3 segments). CTA HEAD FINDINGS POSTERIOR CIRCULATION: --Vertebral arteries: Normal V4 segments. --Inferior cerebellar arteries: Normal. --Basilar artery: Normal. --Superior cerebellar arteries: Normal. --Posterior cerebral arteries (PCA): Normal. ANTERIOR  CIRCULATION: --Intracranial internal carotid arteries: Normal. --Anterior cerebral arteries (ACA): Normal. Both A1 segments are present. Patent anterior communicating artery (a-comm). --Middle cerebral arteries (MCA): Normal. VENOUS SINUSES: As permitted by contrast timing, patent. ANATOMIC VARIANTS: None Review of the MIP images confirms the above findings. IMPRESSION: Normal CTA of the head and neck. Electronically Signed   By: Deatra Robinson M.D.   On: 10/13/2021 19:52   CT HEAD CODE STROKE WO CONTRAST  Result Date: 10/13/2021 CLINICAL DATA:  Code stroke.  Neuro deficit, acute, stroke suspected EXAM: CT HEAD WITHOUT CONTRAST TECHNIQUE: Contiguous axial images were  obtained from the base of the skull through the vertex without intravenous contrast. COMPARISON:  None. FINDINGS: Brain: No evidence of acute infarction, hemorrhage, hydrocephalus, extra-axial collection or mass lesion/mass effect. Discrete hypodensity in the inferior right basal ganglia. Vascular: No hyperdense vessel identified Skull: No acute fracture. Sinuses/Orbits: Retention cyst in the right maxillary sinus. Mild ethmoid air cell mucosal thickening. No acute orbital findings. Other: No mastoid effusions ASPECTS [facility] Stroke Program Early CT Score) Total score (0-10 with 10 being normal): 10. IMPRESSION: 1. No evidence of acute large vascular territory infarct or acute hemorrhage. ASPECTS is 10. 2. Discrete hypodensity in the inferior right basal ganglia, compatible with dilated perivascular space (favored) or remote lacunar infarct. Code stroke imaging results were communicated on 10/13/2021 at 6:15 pm to provider Dr. Erma Heritage via telephone, who verbally acknowledged these results. Electronically Signed   By: Feliberto Harts M.D.   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  Patient also on rituximab every 6 months  DVT prophylaxis: Lovenox  Code Status: full code  Family Communication:  none  Disposition Plan: Back to previous home environment Consults called: Neurology Status: Observation    Andris Baumann MD [facility]     10/13/2021, 9:01 PM

## 2021-10-13 NOTE — ED Notes (Signed)
CBG 98  

## 2021-10-13 NOTE — ED Triage Notes (Signed)
Pt comes pov with dizziness starting about 30 mins ago. Hx of vertigo but states his eyes feel different this time. States they feel like they won't stop moving. Dizzy like he's going to pass out. Denies CP, SOB, n/v/d. States some bilateral leg and hand tingling.

## 2021-10-13 NOTE — Consult Note (Signed)
[facility] Provider: isaacs, C Consult Participants: bedside nurse, patient, wife Location of the provider: [place], [place] Location of the patient: [facility]  This consult was provided via telemedicine with 2-way video and audio communication. The patient/family was informed that care would be provided in this way and agreed to receive care in this manner.    Chief Complaint: dizziness  HPI: 60 yo M who presents with acute dizziness with onset around 4:45pm. He has a history of neurosarcoidosis with transverse myelitis. HE has a chronic left sided weakness which he states is unchanged. On initial evaluation, he was having significnat nystagmus and he states that he felt like "my eyes were jumping" and therefore a code stroke was called. HE typically walks with a cane, but during this acute episode, he felt significantly more off balance than he typically does.     LKW: 4:45 pm tpa given?: No, mild symptoms IR Thrombectomy? No, mild symptoms Modified Rankin Scale: 2-Slight disability-UNABLE to perform all activities but does not need assistance Time of teleneurologist evaluation: 18:12  Exam: Vitals:   10/13/21 1743 10/13/21 1813  BP: (!) 144/94 (!) 147/80  Pulse: 86 69  Resp: 18 11  Temp: 98 F (36.7 C)   SpO2: 95% 96%    General: In bed, NAD  1A: Level of Consciousness - 0 1B: Ask Month and Age - 0 1C: 'Blink Eyes' & 'Squeeze Hands' - 0 2: Test Horizontal Extraocular Movements - 0 3: Test Visual Fields - 0 4: Test Facial Palsy - 0 5A: Test Left Arm Motor Drift - 1 5B: Test Right Arm Motor Drift - 0 6A: Test Left Leg Motor Drift - 1 6B: Test Right Leg Motor Drift - 0 7: Test Limb Ataxia - 1 8: Test Sensation - 0 9: Test Language/Aphasia- 0 10: Test Dysarthria - 0 11: Test Extinction/Inattention - 0 NIHSS score: 3   Imaging Reviewed: CT head - negative.   Labs reviewed in epic and pertinent  values follow: CBG 98   Assessment: 60 year old male with transient disequilibrium, nystagmus.  There is neurosarcoidosis could be playing a role, would not expect the symptoms to be so transient and therefore I am concerned about possible transient ischemic attack.  I would favor treating it as such at the current time.  He will need to be admitted and have further work-up as outlined below.  Recommendations: 1) CTA head and neck 2) Echo 3) Lipids, A1c 4) ASA 81mg  daily 5) neurology to follow.     This patient is receiving care for possible acute neurological changes. There was 35 minutes of care by this provider at the time of service, including time for direct evaluation via telemedicine, review of medical records, imaging studies and discussion of findings with providers, the patient and/or family.  , MD [facility] [phone]  If 7pm- 7am, please page neurology on call as listed in AMION.

## 2021-10-13 NOTE — ED Notes (Signed)
Spoke with Dr Erma Heritage, per symptoms starting 30 mins PTA, activate code stroke

## 2021-10-13 NOTE — Progress Notes (Signed)
  Chaplain On-Call responded to Code Stroke notification at 48.  The patient was being attended by the medical team and was not available for a visit.  Chaplain will remain available for support as needed.  Chaplain Evelena Peat M.Div., [facility]

## 2021-10-13 NOTE — ED Provider Notes (Addendum)
[facility] Emergency Department Provider Note  ____________________________________________   Event Date/Time   First MD Initiated Contact with Patient 10/13/21 1819     (approximate)  I have reviewed the triage vital signs and the nursing notes.   HISTORY  Chief Complaint Dizziness    HPI William Lynn is a 60 y.o. male with past medical history of GERD, sarcoidosis, here with transient dizziness.  The patient states that earlier today, he developed acute onset of blurred vision, dizziness, and loss of balance.  This resolved spontaneously with time.  He now feels back to his baseline.  He states he has had somewhat similar symptoms in the past, but has not formally been diagnosed with anything.  He does state that he is also had some weakness in his left arm and leg, but this is been ongoing for a year.  He arrived with sensation of room spinning and dizziness so was activated as a code stroke.  CT head was negative.  Neurology has seen the patient, evaluated, and recommendations are as below.  Patient feels now back to his baseline.  Denies any fevers or chills.  No recent illness or trauma.  No other complaints.    Past Medical History:  Diagnosis Date   GERD (gastroesophageal reflux disease)    Neuropathy    Sarcoidosis     Patient Active Problem List   Diagnosis Date Noted   TIA (transient ischemic attack) 10/13/2021   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   On corticosteroid therapy 06/21/2018   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   Gait abnormality 02/25/2018   Low serum thyroid stimulating hormone (TSH) 02/25/2018   Serum gammaglobulin increased 02/25/2018   Muscle weakness (generalized) 06/07/2014   Closed fracture of glenoid cavity and neck of scapula 06/03/2014   S/P arthroscopy of shoulder 06/03/2014   Stiffness of joint, not elsewhere classified,  shoulder region 06/03/2014    Past Surgical History:  Procedure Laterality Date    HERNIA REPAIR     KNEE SURGERY Right    SHOULDER SURGERY Bilateral     Prior to Admission medications   Medication Sig Start Date End Date Taking? Authorizing Provider  cetirizine (ZYRTEC) 10 MG tablet TAKE 1 TABLET BY MOUTH EVERY DAY EVERY NIGHT 11/02/18   [provider]  Cholecalciferol (VITAMIN D3) 75 MCG (3000 UT) TABS Take by mouth.    [provider]  clobetasol ointment (TEMOVATE) 0.05 % APPLY TOPICALLY TWO (2) TIMES A DAY. FOR RASHES, LARGE BSA 01/13/19   [provider]  cyclobenzaprine (FLEXERIL) 10 MG tablet Take 1 tablet (10 mg total) by mouth 2 (two) times daily as needed for muscle spasms. Do not drive while taking as can cause drowsiness 05/12/20   Renford Dills, NP  diazepam (VALIUM) 10 MG tablet TAKE 1 TABLET BY MOUTH ONCE FOR ANXIETY 30 MINUTES BEFORE MRI, AND 1 TAB AT THE TIME OF THE MRI 07/10/18   [provider]  DULoxetine (CYMBALTA) 30 MG capsule Take 30 mg by mouth daily. 05/02/20   [provider]  famotidine (PEPCID) 20 MG tablet Take 20 mg by mouth 2 (two) times daily. 02/25/20   [provider]  loratadine (CLARITIN) 10 MG tablet TAKE 10 MG BY MOUTH NIGHTLY. AS NEEDED FOR ITCHING 01/12/19   [provider]  methylPREDNISolone (MEDROL DOSEPAK) 4 MG TBPK tablet as directed 06/11/18   [provider]  mycophenolate (CELLCEPT) 500 MG tablet Take 1,000 mg by mouth 2 (two) times daily. 04/19/20  [provider]  oxyCODONE (OXY IR/ROXICODONE) 5 MG immediate release tablet  10/04/10   [provider]  pantoprazole (PROTONIX) 20 MG tablet TAKE 1 TABLET (20 MG TOTAL) BY MOUTH DAILY. 30 MIN BEFORE BREAKFAST. 11/03/18   [provider]  pantoprazole (PROTONIX) 40 MG tablet TAKE 1 TABLET (40 MG TOTAL) BY MOUTH DAILY. 30 MIN BEFORE BREAKFAST. DURING STEROID TREATMENT. 01/12/19   [provider]  predniSONE (DELTASONE) 20 MG tablet TAKE 40 MG/DAY FOR A MONTH, UNTIL YOU SEE DERMATOLOGY.  01/12/19   [provider]  pregabalin (LYRICA) 75 MG capsule Take 75 mg by mouth 2 (two) times daily. 04/17/20   [provider]  prochlorperazine (COMPAZINE) 10 MG tablet TAKE 1 TABLET BY MOUTH TWICE A DAY AS NEEDED NAUSEA 06/11/18   [provider]  sulfamethoxazole-trimethoprim (BACTRIM,SEPTRA) 400-80 MG tablet Take by mouth. 09/13/18   [provider]    Allergies Patient has no known allergies.  Family History  Problem Relation Age of Onset   Other Mother        unknown medical history   Other Father        unknown medical history    Social History Social History   Tobacco Use   Smoking status: Former    Types: Cigarettes    Quit date: 05/12/1990    Years since quitting: 31.4   Smokeless tobacco: Never   Tobacco comments:    2-3 cigarette per day  Vaping Use   Vaping Use: Never used  Substance Use Topics   Alcohol use: Never   Drug use: Never    Review of Systems  Review of Systems  Constitutional:  Positive for fatigue. Negative for chills and fever.  HENT:  Negative for sore throat.   Respiratory:  Negative for shortness of breath.   Cardiovascular:  Negative for chest pain.  Gastrointestinal:  Negative for abdominal pain.  Genitourinary:  Negative for flank pain.  Musculoskeletal:  Negative for neck pain.  Skin:  Negative for rash and wound.  Allergic/Immunologic: Negative for immunocompromised state.  Neurological:  Positive for dizziness and weakness. Negative for numbness.  Hematological:  Does not bruise/bleed easily.  All other systems reviewed and are negative.   ____________________________________________  PHYSICAL EXAM:      VITAL SIGNS: ED Triage Vitals  Enc Vitals Group     BP 10/13/21 1743 (!) 144/94     Pulse Rate 10/13/21 1743 86     Resp 10/13/21 1743 18     Temp 10/13/21 1743 98 F (36.7 C)     Temp Source 10/13/21 1743 Oral     SpO2 10/13/21 1743 95 %     Weight 10/13/21 1744 215 lb (97.5 kg)      Height 10/13/21 1744  (1.956 m)     Head Circumference --      Peak Flow --      Pain Score 10/13/21 1744 7     Pain Loc --      Pain Edu? --      Excl. in GC? --      Physical Exam Vitals and nursing note reviewed.  Constitutional:      General: He is not in acute distress.    Appearance: He is well-developed.  HENT:     Head: Normocephalic and atraumatic.  Eyes:     Conjunctiva/sclera: Conjunctivae normal.  Cardiovascular:     Rate and Rhythm: Normal rate and regular rhythm.     Heart sounds: Normal heart  sounds. No murmur heard.   No friction rub.  Pulmonary:     Effort: Pulmonary effort is normal. No respiratory distress.     Breath sounds: Normal breath sounds. No wheezing or rales.  Abdominal:     General: There is no distension.     Palpations: Abdomen is soft.     Tenderness: There is no abdominal tenderness.  Musculoskeletal:     Cervical back: Neck supple.  Skin:    General: Skin is warm.     Capillary Refill: Capillary refill takes less than 2 seconds.  Neurological:     Mental Status: He is alert and oriented to person, place, and time.     GCS: GCS eye subscore is 4. GCS verbal subscore is 5. GCS motor subscore is 6.     Cranial Nerves: Cranial nerves are intact.     Sensory: Sensation is intact.     Motor: Motor function is intact. No abnormal muscle tone.      ____________________________________________   LABS (all labs ordered are listed, but only abnormal results are displayed)  Labs Reviewed  URINALYSIS, COMPLETE (UACMP) WITH MICROSCOPIC - Abnormal; Notable for the following components:      Result Value   Color, Urine YELLOW (*)    APPearance CLEAR (*)    Specific Gravity, Urine 1.004 (*)    All other components within normal limits  COMPREHENSIVE METABOLIC PANEL - Abnormal; Notable for the following components:   Creatinine, Ser 1.36 (*)    Total Bilirubin 1.8 (*)    GFR, Estimated 60 (*)    All other components within normal  limits  CBC  DIFFERENTIAL  PROTIME-INR  APTT  HIV ANTIBODY (ROUTINE TESTING W REFLEX)  HEMOGLOBIN A1C  LIPID PANEL  CBG MONITORING, ED  I-STAT CREATININE, ED    ____________________________________________  EKG: Normal sinus rhythm, ventricular rate 75.  PR 176, QRS 80, QTc 408.  No acute ST elevations or depressions. ________________________________________  RADIOLOGY All imaging, including plain films, CT scans, and ultrasounds, independently reviewed by me, and interpretations confirmed via formal radiology reads.  ED MD interpretation:   CT head: Negative CT angio: Negative  Official radiology report(s): CT ANGIO HEAD NECK W WO CM  Result Date: 10/13/2021 CLINICAL DATA:  Acute onset dizziness EXAM: CT ANGIOGRAPHY HEAD AND NECK TECHNIQUE: Multidetector CT imaging of the head and neck was performed using the standard protocol during bolus administration of intravenous contrast. Multiplanar CT image reconstructions and MIPs were obtained to evaluate the vascular anatomy. Carotid stenosis measurements (when applicable) are obtained utilizing NASCET criteria, using the distal internal carotid diameter as the denominator. CONTRAST:  108mL OMNIPAQUE IOHEXOL 350 MG/ML SOLN COMPARISON:  None. FINDINGS: CTA NECK FINDINGS SKELETON: There is no bony spinal canal stenosis. No lytic or blastic lesion. OTHER NECK: Normal pharynx, larynx and major salivary glands. No cervical lymphadenopathy. Unremarkable thyroid gland. UPPER CHEST: No pneumothorax or pleural effusion. No nodules or masses. AORTIC ARCH: There is no calcific atherosclerosis of the aortic arch. There is no aneurysm, dissection or hemodynamically significant stenosis of the visualized portion of the aorta. Conventional 3 vessel aortic branching pattern. The visualized proximal subclavian arteries are widely patent. RIGHT CAROTID SYSTEM: Normal without aneurysm, dissection or stenosis. LEFT CAROTID SYSTEM: Normal without aneurysm,  dissection or stenosis. VERTEBRAL ARTERIES: Left dominant configuration. Both origins are clearly patent. There is no dissection, occlusion or flow-limiting stenosis to the skull base (V1-V3 segments). CTA HEAD FINDINGS POSTERIOR CIRCULATION: --Vertebral arteries: Normal V4 segments. --Inferior cerebellar arteries:  Normal. --Basilar artery: Normal. --Superior cerebellar arteries: Normal. --Posterior cerebral arteries (PCA): Normal. ANTERIOR CIRCULATION: --Intracranial internal carotid arteries: Normal. --Anterior cerebral arteries (ACA): Normal. Both A1 segments are present. Patent anterior communicating artery (a-comm). --Middle cerebral arteries (MCA): Normal. VENOUS SINUSES: As permitted by contrast timing, patent. ANATOMIC VARIANTS: None Review of the MIP images confirms the above findings. IMPRESSION: Normal CTA of the head and neck. Electronically Signed   By: Deatra Robinson M.D.   On: 10/13/2021 19:52   CT HEAD CODE STROKE WO CONTRAST  Result Date: 10/13/2021 CLINICAL DATA:  Code stroke.  Neuro deficit, acute, stroke suspected EXAM: CT HEAD WITHOUT CONTRAST TECHNIQUE: Contiguous axial images were obtained from the base of the skull through the vertex without intravenous contrast. COMPARISON:  None. FINDINGS: Brain: No evidence of acute infarction, hemorrhage, hydrocephalus, extra-axial collection or mass lesion/mass effect. Discrete hypodensity in the inferior right basal ganglia. Vascular: No hyperdense vessel identified Skull: No acute fracture. Sinuses/Orbits: Retention cyst in the right maxillary sinus. Mild ethmoid air cell mucosal thickening. No acute orbital findings. Other: No mastoid effusions ASPECTS [facility] Stroke Program Early CT Score) Total score (0-10 with 10 being normal): 10. IMPRESSION: 1. No evidence of acute large vascular territory infarct or acute hemorrhage. ASPECTS is 10. 2. Discrete hypodensity in the inferior right basal ganglia, compatible with dilated perivascular space  (favored) or remote lacunar infarct. Code stroke imaging results were communicated on 10/13/2021 at 6:15 pm to provider Dr. Erma Heritage via telephone, who verbally acknowledged these results. Electronically Signed   By: Feliberto Harts M.D.   On: 10/13/2021 18:18    ____________________________________________  PROCEDURES   Procedure(s) performed (including Critical Care):  .1-3 Lead EKG Interpretation Performed by: Shaune Pollack, MD Authorized by: Shaune Pollack, MD     Interpretation: normal     ECG rate:  70-90   ECG rate assessment: normal     Rhythm: sinus rhythm     Ectopy: none     Conduction: normal   Comments:     Indication: stroke ilke deficits  ____________________________________________  INITIAL IMPRESSION / MDM / ASSESSMENT AND PLAN / ED COURSE  As part of my medical decision making, I reviewed the following data within the electronic MEDICAL RECORD NUMBER Nursing notes reviewed and incorporated, Old chart reviewed, Notes from prior ED visits, and [place] Controlled Substance Database       *William Lynn was evaluated in Emergency Department on 10/13/2021 for the symptoms described in the history of present illness. He was evaluated in the context of the global COVID-19 pandemic, which necessitated consideration that the patient might be at risk for infection with the SARS-CoV-2 virus that causes COVID-19. Institutional protocols and algorithms that pertain to the evaluation of patients at risk for COVID-19 are in a state of rapid change based on information released by regulatory bodies including the CDC and federal and state organizations. These policies and algorithms were followed during the patient's care in the ED.  Some ED evaluations and interventions may be delayed as a result of limited staffing during the pandemic.*     Medical Decision Making: 60 year old male here with transient dizziness, now resolved.  Initial differential is TIA versus vertigo, the transient  nature of this makes TIA more concerning.  Patient does have a history of stroke and has what appears to be a chronic infarct on his CT.  Lab work reassuring.  Patient also with history of neurosarcoidosis.  Case discussed with neurology as patient was activated as a code stroke  on arrival.  They recommend admission for TIA evaluation and further work-up.  Patient updated and in agreement.  Has no focal neurological deficits on my exam.  ____________________________________________  FINAL CLINICAL IMPRESSION(S) / ED DIAGNOSES  Final diagnoses:  Stroke-like symptom     MEDICATIONS GIVEN DURING THIS VISIT:  Medications   stroke: mapping our early stages of recovery book (has no administration in time range)  acetaminophen (TYLENOL) tablet 650 mg (has no administration in time range)    Or  acetaminophen (TYLENOL) 160 MG/5ML solution 650 mg (has no administration in time range)    Or  acetaminophen (TYLENOL) suppository 650 mg (has no administration in time range)  enoxaparin (LOVENOX) injection 40 mg (has no administration in time range)  iohexol (OMNIPAQUE) 350 MG/ML injection 75 mL (75 mLs Intravenous Contrast Given 10/13/21 1915)     ED Discharge Orders     None        Note:  This document was prepared using Dragon voice recognition software and may include unintentional dictation errors.   Shaune Pollack, MD 10/13/21 2133    Shaune Pollack, MD 10/13/21 2133

## 2021-10-13 NOTE — ED Notes (Signed)
Patient transported to CT 

## 2021-10-14 ENCOUNTER — Observation Stay (HOSPITAL_BASED_OUTPATIENT_CLINIC_OR_DEPARTMENT_OTHER)
Admit: 2021-10-14 | Discharge: 2021-10-14 | Disposition: A | Discharge status: Home / Self Care | Payer: Medicaid Other | Attending: Internal Medicine | Admitting: Internal Medicine

## 2021-10-14 DIAGNOSIS — G459 Transient cerebral ischemic attack, unspecified: Secondary | ICD-10-CM | POA: Diagnosis not present

## 2021-10-14 LAB — LIPID PANEL
Cholesterol: 212 mg/dL — ABNORMAL HIGH (ref 0–200)
HDL: 46 mg/dL (ref >40)
LDL Cholesterol: 142 mg/dL — ABNORMAL HIGH (ref 0–99)
Total CHOL/HDL Ratio: 4.6 RATIO
Triglycerides: 122 mg/dL (ref <150)
VLDL: 24 mg/dL (ref 0–40)

## 2021-10-14 LAB — RESP PANEL BY RT-PCR (FLU A&B, COVID) ARPGX2
Influenza A by PCR: NEGATIVE
Influenza B by PCR: NEGATIVE
SARS Coronavirus 2 by RT PCR: NEGATIVE

## 2021-10-14 LAB — ECHOCARDIOGRAM COMPLETE
AR max vel: 2.84 cm2
AV Peak grad: 4 mmHg
Ao pk vel: 1 m/s
Area-P 1/2: 3.63 cm2
Height: 77 in
S' Lateral: 3.6 cm
Weight: 3712 oz

## 2021-10-14 LAB — HIV ANTIBODY (ROUTINE TESTING W REFLEX): HIV Screen 4th Generation wRfx: NONREACTIVE

## 2021-10-14 MED ORDER — ATORVASTATIN CALCIUM 20 MG PO TABS: 40.0000 mg | ORAL_TABLET | Freq: Every day | ORAL | Status: DC

## 2021-10-14 MED ORDER — ATORVASTATIN CALCIUM 80 MG PO TABS: 80.0000 mg | ORAL_TABLET | Freq: Every day | ORAL | 1 refills | Status: AC

## 2021-10-14 MED ORDER — ATORVASTATIN CALCIUM 20 MG PO TABS
80.0000 mg | ORAL_TABLET | Freq: Every day | ORAL | Status: DC
  Administered 2021-10-14: 80 mg via ORAL
  Filled 2021-10-14: qty 4

## 2021-10-14 MED ORDER — CLOPIDOGREL BISULFATE 75 MG PO TABS: 75.0000 mg | ORAL_TABLET | Freq: Every day | ORAL | 0 refills | Status: AC

## 2021-10-14 MED ORDER — CLOPIDOGREL BISULFATE 75 MG PO TABS
300.0000 mg | ORAL_TABLET | Freq: Once | ORAL | Status: AC
  Administered 2021-10-14: 300 mg via ORAL
  Filled 2021-10-14: qty 4

## 2021-10-14 MED ORDER — ASPIRIN 81 MG PO TBEC: 81.0000 mg | DELAYED_RELEASE_TABLET | Freq: Every day | ORAL | 11 refills | Status: AC

## 2021-10-14 MED ORDER — ASPIRIN EC 81 MG PO TBEC
81.0000 mg | DELAYED_RELEASE_TABLET | Freq: Every day | ORAL | Status: DC
  Administered 2021-10-14: 81 mg via ORAL
  Filled 2021-10-14: qty 1

## 2021-10-14 MED ORDER — CLOPIDOGREL BISULFATE 75 MG PO TABS: 75.0000 mg | ORAL_TABLET | Freq: Every day | ORAL | Status: DC

## 2021-10-14 NOTE — Progress Notes (Signed)
*  PRELIMINARY RESULTS* Echocardiogram 2D Echocardiogram has been performed.  William Lynn C [name] 10/14/2021, 10:50 AM

## 2021-10-14 NOTE — Evaluation (Signed)
Occupational Therapy Evaluation Patient Details Name: William Lynn MRN: [id number] DOB: [birth year] Today's Date: 10/14/2021   History of Present Illness 60 y.o. 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 Pt ambulates with a cane at baseline for chronic left-sided weakness. Pt presented to the ED as a code stroke after presenting with acute onset of blurred vision, dizziness, and loss of balance. The symptoms lasted about 30 minutes and then resolved on its own and patient feels back to his baseline. Imaging CT head and CT angio head and neck normal. 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 Prior to hospital admission, pt was independent in all aspects of ADLs, occasionally using a SPC for mobility, and denies falls history in past 6 months. Pt lives with his spouse in a 2 story home with level entry. Pt currently reporting symptoms of blurred vision, dizziness, and decreased balance have resolved and demonstrates baseline independence to perform ADLs and mobility tasks. During evaluation, pt reported having pain (at baseline) when bending outside BOS to perform LB dressing; pt educated on non-pharmacological pain management strategies (i.e., adaptive dressing strategies and adaptive equipment). Pt demonstrated good understanding regarding use of reacher, sock aide, and long handled shoe horn for assisting with LB dressing. At end of session, pt reported 10/10 confidence with using AD independently at home. No additional skilled OT needs identified. Will sign off. Please re-consult if additional OT needs arise.      Recommendations for follow up therapy are one component of a multi-disciplinary discharge planning process, led by the attending physician.  Recommendations may be updated based on  patient status, additional functional criteria and insurance authorization.   Follow Up Recommendations  No OT follow up;Supervision - Intermittent    Equipment Recommendations  None recommended by OT       Precautions / Restrictions Precautions Precautions: None Restrictions Weight Bearing Restrictions: No      Mobility Bed Mobility Overal bed mobility: Independent                  Transfers Overall transfer level: Independent                Balance Overall balance assessment: Needs assistance   Sitting balance-Leahy Scale: Normal       Standing balance-Leahy Scale: Good Standing balance comment: minor deficits during ambulation that did not require AD or CGA to correct.                            ADL either performed or assessed with clinical judgement   ADL Overall ADL's : Modified independent                                       General ADL Comments: Pt able to don/doff socks and shoes with AD (i.e., reacher, sock aide, and long-handled shoe horn).     Vision Baseline Vision/History: 1 Wears glasses Patient Visual Report: No change from baseline              Pertinent Vitals/Pain Pain Assessment: No/denies pain        Extremity/Trunk Assessment Upper Extremity Assessment Upper Extremity Assessment: Overall WFL for tasks assessed   Lower Extremity Assessment Lower Extremity Assessment: Overall  WFL for tasks assessed;Defer to PT evaluation   Cervical / Trunk Assessment Cervical / Trunk Assessment: Normal   Communication Communication Communication: No difficulties   Cognition Arousal/Alertness: Awake/alert Behavior During Therapy: WFL for tasks assessed/performed Overall Cognitive Status: Within Functional Limits for tasks assessed                                 General Comments: A&Ox4      Exercises Other Exercises Other Exercises: Pt reporting baseline pain when bending over for LB  dressing. Pt educated on use of reacher, sock aide, and long handled shoe horn, with pt demonstrating good understanding and reporting 10/10 confidence in being able to use AD at home        Home Living Family/patient expects to be discharged to:: Private residence Living Arrangements: Children;Spouse/significant other Available Help at Discharge: Family;Available 24 hours/day Type of Home: House Home Access: Level entry     Home Layout: Two level;Able to live on main level with bedroom/bathroom Alternate Level Stairs-Number of Steps: 14 Alternate Level Stairs-Rails: Right Bathroom Shower/Tub: Producer, television/film/video: Standard     Home Equipment: Production assistant, radio - single point   Additional Comments: Uses SPC for longer distance ambulation      Prior Functioning/Environment Level of Independence: Independent with assistive device(s)        Comments: Uses SPC for longer distance ambulation. Pt independent with ADLs. Reports occasional "stumbles" due to LOB however no falls.        OT Problem List: Impaired balance (sitting and/or standing);Pain         OT Goals(Current goals can be found in the care plan section) Acute Rehab OT Goals Patient Stated Goal: to have less pain during LB dressing OT Goal Formulation: With patient Time For Goal Achievement: 10/28/21 Potential to Achieve Goals: Good  OT Frequency:      AM-PAC OT "6 Clicks" Daily Activity     Outcome Measure Help from another person eating meals?: None Help from another person taking care of personal grooming?: None Help from another person toileting, which includes using toliet, bedpan, or urinal?: A Little Help from another person bathing (including washing, rinsing, drying)?: A Little Help from another person to put on and taking off regular upper body clothing?: None Help from another person to put on and taking off regular lower body clothing?: A Little 6 Click Score: 21   End of Session  Nurse Communication: Mobility status  Activity Tolerance: Patient tolerated treatment well Patient left: in bed;with call bell/phone within reach  OT Visit Diagnosis: Unsteadiness on feet (R26.81)                Time: 6301-6010 (Evaluation time: 9:52-10:00; Treatment time: 10:46-1105) OT Time Calculation (min): 73 min Charges:  OT General Charges $OT Visit: 1 Visit OT Evaluation $OT Eval Moderate Complexity: 1 Mod OT Treatments $Self Care/Home Management : 8-22 mins  Matthew Folks, OTR/L [facility] [phone]

## 2021-10-14 NOTE — Discharge Summary (Signed)
William Lynn ACZ:[id number] DOB: [birth year] DOA: 10/13/2021  PCP: [facility]  Admit date: 10/13/2021 Discharge date: 10/14/2021  Time spent: 45 minutes  Recommendations for Outpatient Follow-up:  Pcp and neurology f/u     Discharge Diagnoses:  Principal Problem:   TIA (transient ischemic attack) Active Problems:   History of Transverse myelitis (HCC)   Neurosarcoidosis   Discharge Condition: stable  Diet recommendation: heart healthy  Filed Weights   10/13/21 1744 10/13/21 1807  Weight: 97.5 kg 105.2 kg    History of present illness:  William Lynn is a 60 y.o. 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  The symptoms lasted about 30 minutes and then resolved on its own and patient feels back to his baseline.  He denies other complaints.  He denies recent cough fever or chills.  Denies headache, nausea or vomiting, denies abdominal pain, diarrhea.  Hospital Course:  Presented w/ transient dysequilibrium and nystagmus. CT imaging negative. TTE negative. Symptoms resolved. Evaluated by neurology. Pt says would only tolerate MRI under general anesthesia; neuro advised against pursuing as likely unrevealing. Advising asa/plavix for 21 days followed by asa monotherapy. Atorvastatin also prescribed. Advised f/u with his neurologist and pcp.   Procedures: none   Consultations: neurology  Discharge Exam: Vitals:   10/14/21 0322 10/14/21 0959  BP: 138/88 (!) 119/91  Pulse: 79 73  Resp: 20 18  Temp: 98.6 F (37 C)    SpO2: 94% 95%    General: NAD Cardiovascular: RRR Respiratory: CTAB Neuro: cn 2-12 grossly intact, 5/5 upper/lower strength  Discharge Instructions   Discharge Instructions     Diet - low sodium heart healthy   Complete by: As directed    Increase activity slowly   Complete by: As directed       Allergies as of 10/14/2021   No Known Allergies      Medication List     STOP taking these medications    cetirizine 10 MG tablet Commonly known as: ZYRTEC   cyclobenzaprine 10 MG tablet Commonly known as: FLEXERIL   diazepam 10 MG tablet Commonly known as: VALIUM   DULoxetine 30 MG capsule Commonly known as: CYMBALTA   famotidine 20 MG tablet Commonly known as: PEPCID   loratadine 10 MG tablet Commonly known as: CLARITIN   methylPREDNISolone 4 MG Tbpk tablet Commonly known as: MEDROL DOSEPAK   mycophenolate 250 MG capsule Commonly known as: CELLCEPT   oxyCODONE 5 MG immediate release tablet Commonly known as: Oxy IR/ROXICODONE   prochlorperazine 10 MG tablet Commonly known as: COMPAZINE   sulfamethoxazole-trimethoprim 400-80 MG tablet Commonly known as: BACTRIM       TAKE these medications    alendronate 70 MG tablet Commonly known as: FOSAMAX Take 70 mg by mouth every 7 (seven) days. Must sit or walk for 30 minutes after taking the tablet.   aspirin 81 MG EC tablet Take 1 tablet (81 mg total) by mouth daily. Swallow whole. Start taking on: October 15, 2021   atorvastatin 80 MG tablet Commonly known as: LIPITOR Take 1 tablet (80 mg total) by mouth daily. Start taking  on: October 15, 2021   clobetasol ointment 0.05 % Commonly known as: TEMOVATE Apply 1 application topically daily as needed for rash. On back   clopidogrel 75 MG tablet Commonly known as: Plavix Take 1 tablet (75 mg total) by mouth daily.   dapsone 100 MG tablet Take 100 mg by mouth daily.   pantoprazole 20 MG tablet Commonly known as: PROTONIX Take 20 mg by mouth  daily. What changed: Another medication with the same name was removed. Continue taking this medication, and follow the directions you see here.   predniSONE 10 MG tablet Commonly known as: DELTASONE Take 10 mg by mouth daily. What changed: Another medication with the same name was removed. Continue taking this medication, and follow the directions you see here.   pregabalin 75 MG capsule Commonly known as: LYRICA Take 75 mg by mouth 2 (two) times daily. Take 75 mg in the morning and 150 mg at bedtime.   tiZANidine 2 MG tablet Commonly known as: ZANAFLEX Take 2 mg by mouth 3 (three) times daily.   Vitamin D3 50 MCG (2000 UT) Tabs Take 4,000 Units by mouth daily.       No Known Allergies  Follow-up Information     [facility] Follow up.   Specialty: Family Medicine Contact information: [address] [place] [address] [phone]         your [facility] neurologist Follow up.                   The results of significant diagnostics from this hospitalization (including imaging, microbiology, ancillary and laboratory) are listed below for reference.    Significant Diagnostic Studies: CT ANGIO HEAD NECK W WO CM  Result Date: 10/13/2021 CLINICAL DATA:  Acute onset dizziness EXAM: CT ANGIOGRAPHY HEAD AND NECK TECHNIQUE: Multidetector CT imaging of the head and neck was performed using the standard protocol during bolus administration of intravenous contrast. Multiplanar CT image reconstructions and MIPs were obtained to evaluate the vascular anatomy. Carotid stenosis measurements (when applicable) are obtained utilizing NASCET criteria, using the distal internal carotid diameter as the denominator. CONTRAST:  40mL OMNIPAQUE IOHEXOL 350 MG/ML SOLN COMPARISON:  None. FINDINGS: CTA NECK FINDINGS SKELETON: There is no bony spinal canal stenosis. No lytic or blastic lesion. OTHER NECK: Normal pharynx, larynx and major salivary glands. No cervical lymphadenopathy.  Unremarkable thyroid gland. UPPER CHEST: No pneumothorax or pleural effusion. No nodules or masses. AORTIC ARCH: There is no calcific atherosclerosis of the aortic arch. There is no aneurysm, dissection or hemodynamically significant stenosis of the visualized portion of the aorta. Conventional 3 vessel aortic branching pattern. The visualized proximal subclavian arteries are widely patent. RIGHT CAROTID SYSTEM: Normal without aneurysm, dissection or stenosis. LEFT CAROTID SYSTEM: Normal without aneurysm, dissection or stenosis. VERTEBRAL ARTERIES: Left dominant configuration. Both origins are clearly patent. There is no dissection, occlusion or flow-limiting stenosis to the skull base (V1-V3 segments). CTA HEAD FINDINGS POSTERIOR CIRCULATION: --Vertebral arteries: Normal V4 segments. --Inferior cerebellar arteries: Normal. --Basilar artery: Normal. --Superior cerebellar arteries: Normal. --Posterior cerebral arteries (PCA): Normal. ANTERIOR CIRCULATION: --Intracranial internal carotid arteries: Normal. --Anterior cerebral arteries (ACA): Normal. Both A1 segments are present. Patent anterior communicating artery (a-comm). --Middle cerebral arteries (MCA): Normal. VENOUS SINUSES: As permitted by contrast timing, patent. ANATOMIC VARIANTS: None Review of the MIP images confirms the above findings. IMPRESSION: Normal CTA of the head and neck. Electronically Signed   By: Deatra Robinson M.D.   On: 10/13/2021 19:52   ECHOCARDIOGRAM COMPLETE  Result Date: 10/14/2021    ECHOCARDIOGRAM REPORT   Patient Name:   [facility] Date of Exam: 10/14/2021 Medical Rec #:  [id number]     Height:       77.0 in Accession #:    [id number]    Weight:       232.0 lb Date of Birth:  07-18-1961     BSA:          2.382 m Patient Age:    60 years      BP:           144/94 mmHg Patient Gender: M             HR:           75 bpm. Exam Location:  [facility] Procedure: 2D Echo, Cardiac Doppler, Color Doppler and Strain Analysis Indications:     TIA  G45.9  History:         Patient has no prior history of Echocardiogram examinations.  Sonographer:     Neysa Bonito Roar Referring Phys:  [id number] Andris Baumann Diagnosing Phys: Julien Nordmann MD IMPRESSIONS  1. Left ventricular ejection fraction, by estimation, is 55%. The left ventricle has normal function. The left ventricle has no regional wall motion abnormalities. Left ventricular diastolic parameters are consistent with Grade I diastolic dysfunction (impaired relaxation). The average left ventricular global longitudinal strain is -17.9 %. The global longitudinal strain is normal.  2. Right ventricular systolic function is normal. The right ventricular size is normal. There is normal pulmonary artery systolic pressure. The estimated right ventricular systolic pressure is 29.2 mmHg.  3. The mitral valve is normal in structure. No evidence of mitral valve regurgitation. No evidence of mitral stenosis.  4. The aortic valve was not well visualized. Aortic valve regurgitation is mild to moderate. No aortic stenosis is present.  5. There is mild dilatation of the ascending aorta and of the aortic root, measuring 39 mm.  6. The inferior vena cava is normal in size with greater than 50% respiratory variability, suggesting right atrial pressure of 3 mmHg. FINDINGS  Left Ventricle: Left ventricular ejection fraction, by estimation, is 55%. The left ventricle has normal function. The left ventricle has no regional wall motion abnormalities. The average left ventricular global longitudinal strain is -17.9 %. The global longitudinal strain is normal. The left ventricular internal cavity size was normal in size. There is no left ventricular hypertrophy. Left ventricular diastolic parameters are consistent with Grade I diastolic dysfunction (impaired relaxation). Right Ventricle: The right ventricular size is normal. No increase in right ventricular wall thickness. Right ventricular systolic function is normal. There is normal  pulmonary artery systolic pressure. The tricuspid regurgitant velocity is 2.46 m/s, and  with an assumed right atrial pressure of 5 mmHg, the estimated right ventricular systolic pressure is 29.2 mmHg. Left Atrium: Left atrial size was normal in size. Right Atrium: Right atrial size was normal in size. Pericardium: There is no evidence of pericardial effusion. Mitral Valve: The mitral valve is normal in structure. No evidence of mitral valve regurgitation. No evidence of mitral valve stenosis. Tricuspid Valve: The tricuspid valve is normal in structure. Tricuspid valve regurgitation is mild . No evidence of tricuspid stenosis. Aortic Valve: The aortic valve was not well visualized. Aortic valve regurgitation is mild to moderate. No aortic stenosis is present. Aortic valve peak gradient measures 4.0 mmHg. Pulmonic Valve: The pulmonic valve was normal in structure. Pulmonic valve regurgitation is not visualized. No evidence of pulmonic stenosis.  Aorta: The aortic root is normal in size and structure. There is mild dilatation of the ascending aorta and of the aortic root, measuring 39 mm. Venous: The inferior vena cava is normal in size with greater than 50% respiratory variability, suggesting right atrial pressure of 3 mmHg. IAS/Shunts: No atrial level shunt detected by color flow Doppler.  LEFT VENTRICLE PLAX 2D LVIDd:         4.95 cm   Diastology LVIDs:         3.60 cm   LV e' medial:    5.00 cm/s LV PW:         1.15 cm   LV E/e' medial:  7.5 LV IVS:        1.16 cm   LV e' lateral:   6.31 cm/s LVOT diam:     2.10 cm   LV E/e' lateral: 5.9 LVOT Area:     3.46 cm                          2D Longitudinal Strain                          2D Strain GLS Avg:     -17.9 % RIGHT VENTRICLE RV Mid diam:    3.68 cm RV S prime:     13.20 cm/s TAPSE (M-mode): 2.6 cm LEFT ATRIUM             Index        RIGHT ATRIUM           Index LA diam:        3.80 cm 1.59 cm/m   RA Area:     19.80 cm LA Vol (A2C):   57.3 ml 24.05 ml/m  RA  Volume:   54.90 ml  23.04 ml/m LA Vol (A4C):   50.6 ml 21.24 ml/m LA Biplane Vol: 54.2 ml 22.75 ml/m  AORTIC VALVE                PULMONIC VALVE AV Area (Vmax): 2.84 cm    PV Vmax:        0.66 m/s AV Vmax:        99.90 cm/s  PV Peak grad:   1.8 mmHg AV Peak Grad:   4.0 mmHg    RVOT Peak grad: 1 mmHg LVOT Vmax:      81.80 cm/s  AORTA Ao Root diam: 3.90 cm MITRAL VALVE               TRICUSPID VALVE MV Area (PHT): 3.63 cm    TR Peak grad:   24.2 mmHg MV Decel Time: 209 msec    TR Vmax:        246.00 cm/s MV E velocity: 37.30 cm/s MV A velocity: 57.40 cm/s  SHUNTS MV E/A ratio:  0.65        Systemic Diam: 2.10 cm MV A Prime:    12.0 cm/s Julien Nordmann MD Electronically signed by Julien Nordmann MD Signature Date/Time: 10/14/2021/11:52:49 AM    Final    CT HEAD CODE STROKE WO CONTRAST  Result Date: 10/13/2021 CLINICAL DATA:  Code stroke.  Neuro deficit, acute, stroke suspected EXAM: CT HEAD WITHOUT CONTRAST TECHNIQUE: Contiguous axial images were obtained from the base of the skull through the vertex without intravenous contrast. COMPARISON:  None. FINDINGS: Brain: No evidence of acute infarction, hemorrhage, hydrocephalus, extra-axial collection or mass lesion/mass effect. Discrete hypodensity  in the inferior right basal ganglia. Vascular: No hyperdense vessel identified Skull: No acute fracture. Sinuses/Orbits: Retention cyst in the right maxillary sinus. Mild ethmoid air cell mucosal thickening. No acute orbital findings. Other: No mastoid effusions ASPECTS [facility] Stroke Program Early CT Score) Total score (0-10 with 10 being normal): 10. IMPRESSION: 1. No evidence of acute large vascular territory infarct or acute hemorrhage. ASPECTS is 10. 2. Discrete hypodensity in the inferior right basal ganglia, compatible with dilated perivascular space (favored) or remote lacunar infarct. Code stroke imaging results were communicated on 10/13/2021 at 6:15 pm to provider Dr. Erma Heritage via telephone, who verbally  acknowledged these results. Electronically Signed   By: Feliberto Harts M.D.   On: 10/13/2021 18:18    Microbiology: Recent Results (from the past 240 hour(s))  Resp Panel by RT-PCR (Flu A&B, Covid) Nasopharyngeal Swab     Status: None   Collection Time: 10/14/21  9:15 AM   Specimen: Nasopharyngeal Swab; Nasopharyngeal(NP) swabs in vial transport medium  Result Value Ref Range Status   SARS Coronavirus 2 by RT PCR NEGATIVE NEGATIVE Final    Comment: (NOTE) SARS-CoV-2 target nucleic acids are NOT DETECTED.  The SARS-CoV-2 RNA is generally detectable in upper respiratory specimens during the acute phase of infection. The lowest concentration of SARS-CoV-2 viral copies this assay can detect is 138 copies/mL. A negative result does not preclude SARS-Cov-2 infection and should not be used as the sole basis for treatment or other patient management decisions. A negative result may occur with  improper specimen collection/handling, submission of specimen other than nasopharyngeal swab, presence of viral mutation(s) within the areas targeted by this assay, and inadequate number of viral copies(<138 copies/mL). A negative result must be combined with clinical observations, patient history, and epidemiological information. The expected result is Negative.  Fact Sheet for Patients:  BloggerCourse.com  Fact Sheet for Healthcare Providers:  SeriousBroker.it  This test is no t yet approved or cleared by the [place] FDA and  has been authorized for detection and/or diagnosis of SARS-CoV-2 by FDA under an Emergency Use Authorization (EUA). This EUA will remain  in effect (meaning this test can be used) for the duration of the COVID-19 declaration under Section 564(b)(1) of the Act, 21 U.S.C.section 360bbb-3(b)(1), unless the authorization is terminated  or revoked sooner.       Influenza A by PCR NEGATIVE NEGATIVE Final   Influenza B  by PCR NEGATIVE NEGATIVE Final    Comment: (NOTE) The Xpert Xpress SARS-CoV-2/FLU/RSV plus assay is intended as an aid in the diagnosis of influenza from Nasopharyngeal swab specimens and should not be used as a sole basis for treatment. Nasal washings and aspirates are unacceptable for Xpert Xpress SARS-CoV-2/FLU/RSV testing.  Fact Sheet for Patients: BloggerCourse.com  Fact Sheet for Healthcare Providers: SeriousBroker.it  This test is not yet approved or cleared by the [place] FDA and has been authorized for detection and/or diagnosis of SARS-CoV-2 by FDA under an Emergency Use Authorization (EUA). This EUA will remain in effect (meaning this test can be used) for the duration of the COVID-19 declaration under Section 564(b)(1) of the Act, 21 U.S.C. section 360bbb-3(b)(1), unless the authorization is terminated or revoked.  Performed at [facility], [address] [place], [place] [address]      Labs: Basic Metabolic Panel: Recent Labs  Lab 10/13/21 1746  NA 140  K 3.7  CL 100  CO2 28  GLUCOSE 92  BUN 16  CREATININE 1.36*  CALCIUM 9.7  Liver Function Tests: Recent Labs  Lab 10/13/21 1746  AST 39  ALT 29  ALKPHOS 76  BILITOT 1.8*  PROT 7.8  ALBUMIN 4.6   No results for input(s): LIPASE, AMYLASE in the last 168 hours. No results for input(s): AMMONIA in the last 168 hours. CBC: Recent Labs  Lab 10/13/21 1746  WBC 7.8  NEUTROABS 5.5  HGB 14.2  HCT 43.4  MCV 91.6  PLT 182   Cardiac Enzymes: No results for input(s): CKTOTAL, CKMB, CKMBINDEX, TROPONINI in the last 168 hours. BNP: BNP (last 3 results) No results for input(s): BNP in the last 8760 hours.  ProBNP (last 3 results) No results for input(s): PROBNP in the last 8760 hours.  CBG: Recent Labs  Lab 10/13/21 1757  GLUCAP 98       Signed:  Silvano Bilis MD.  [facility] 10/14/2021, 12:29 PM

## 2021-10-14 NOTE — ED Notes (Signed)
Patient given discharge instructions, all questions answered. Patient in possession of all belongings, directed to the discharge area  

## 2021-10-14 NOTE — Progress Notes (Addendum)
Subjective: No further dizziness. He reports history of a small stroke in the past.   Exam: Vitals:   10/13/21 2215 10/14/21 0322  BP: (!) 146/92 138/88  Pulse: 73 79  Resp: 18 20  Temp: 98.4 F (36.9 C) 98.6 F (37 C)  SpO2: 92% 94%   Gen: In bed, NAD Resp: non-labored breathing, no acute distress Abd: soft, nt  Neuro: MS: awake, alert, interactive and appropriate QI:ONGE without nystagmus, face symmetric Motor: mild left hemiparesis 4/5 Cerebellar: consistent with weakness in LUE, maybe slightly ataxic out of proportion ot weakness in LLE  Pertinent Labs: LDL 142 A1C pednign.   Impression: 60 yo M with  transient dysequilibrium with nystagmus. No TNK as symptoms have resolved. He is unable to tolerate an MRI without general anesthesia and I would not favor pursuing it to that point. Though I recommend an MRI, it is not completely unreasonable hold off for now as it might be  unrevealing with resolved symptoms and the increased risks of GA. Marland Kitchen   Recommendations: 1) High intensity statin therapy with atorvastatin 80mg  daily 2) plavix load with daily 75mg  and asa 81mg  daily x 3 weeks, then ASA monotherapy with 81mg  daily.  3) Echo pending 4) If A1C is positive, DM treatment 5) If echo is negative, then no furthe rinpatient recommendations.    , MD [facility] [phone]  If 7pm- 7am, please page neurology on call as listed in AMION.;

## 2021-10-14 NOTE — ED Notes (Signed)
Patient waiting on wife to bring clothes and pick him up.

## 2021-10-14 NOTE — Evaluation (Signed)
Physical Therapy Evaluation Patient Details Name: William Lynn MRN: [id number] DOB: 12-01-1961 Today's Date: 10/14/2021  History of Present Illness  60 y.o. male arrives to ED as a code stroke presenting with sudden onset dizziness, feeling off balance and like his eyes were jumping. 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  Clinical Impression  Pt received supine in bed, agreeable to therapy. Wife on FaceTime with pt upon entering and remained on phone during PLOF/history. Pt demo Independent bed mobility and STS from a low surface. Strength is WFL bilaterally - minor strength deficits in L hip flexion, otherwise 5/5 throughout BLE. During ambulation w/o AD, pt presented with a rightward lean - presumably due to typically relying on cane in RUE although pt states he only uses the cane with increased distances. During ambulation he demo trunk rotation and reaching outside BOS without LOB. PT did encourage pt to continue previous HEP from recent outpatient PT d/c as it challenges balance activities. During history, decreased endurance seemed to be a common report. PT also encouraging pt increase cardiovascular and muscular endurance incrementally using pt home gym setup. Overall, pt functioning at reported baseline and has no PT needs.         Recommendations for follow up therapy are one component of a multi-disciplinary discharge planning process, led by the attending physician.  Recommendations may be updated based on patient status, additional functional criteria and insurance authorization.  Follow Up Recommendations No PT follow up    Equipment Recommendations  None recommended by PT    Recommendations for Other Services       Precautions / Restrictions Precautions Precautions: None Restrictions Weight Bearing Restrictions: No      Mobility  Bed  Mobility Overal bed mobility: Independent                  Transfers Overall transfer level: Independent               General transfer comment: STS x3 reps  Ambulation/Gait Ambulation/Gait assistance: Supervision Gait Distance (Feet): 200 Feet Assistive device: None Gait Pattern/deviations: Step-through pattern Gait velocity: WFL   General Gait Details: Close SUP for safety, rightward trunk lean, otherwise able to turn head and lean outside BOS during reach to press accessible door button  Stairs            Wheelchair Mobility    Modified Rankin (Stroke Patients Only)       Balance Overall balance assessment: Needs assistance   Sitting balance-Leahy Scale: Normal       Standing balance-Leahy Scale: Good Standing balance comment: minor deficits during ambulation that did not require AD or CGA to correct. Able to reach outside BOS and demo trunk rotation while walking.                             Pertinent Vitals/Pain Pain Assessment: No/denies pain    Home Living Family/patient expects to be discharged to:: Private residence Living Arrangements: Children;Spouse/significant other Available Help at Discharge: Family;Available 24 hours/day Type of Home: House Home Access: Level entry     Home Layout: Two level;Able to live on main level with bedroom/bathroom Home Equipment: Shower seat;Cane - single point Additional Comments: Uses SPC for longer distance ambulation    Prior Function Level of Independence: Independent with assistive device(s)         Comments: Does not drive.  Reports occasional "stumbles" due to LOB however no falls.     Hand Dominance        Extremity/Trunk Assessment   Upper Extremity Assessment Upper Extremity Assessment: Overall WFL for tasks assessed    Lower Extremity Assessment Lower Extremity Assessment: Overall WFL for tasks assessed (minor deficits in LLE hip flexion - otherwise 5/5 BLE)     Cervical / Trunk Assessment Cervical / Trunk Assessment: Normal  Communication   Communication: No difficulties  Cognition Arousal/Alertness: Awake/alert Behavior During Therapy: WFL for tasks assessed/performed Overall Cognitive Status: Within Functional Limits for tasks assessed                                 General Comments: A&Ox4      General Comments      Exercises Other Exercises Other Exercises: Pt educated on right-sided lean leading to minor balance deficits. Pt described previous PT HEP from 6 months ago - PT encouraged pt to restart HEP to promote improved balance and hip strength. PT also encouraged pt to incorporate endurance exercise such as the stationary bike to improve muscular and cardiovascular endurance as well as overall health.   Assessment/Plan    PT Assessment Patent does not need any further PT services  PT Problem List         PT Treatment Interventions      PT Goals (Current goals can be found in the Care Plan section)  Acute Rehab PT Goals Patient Stated Goal: to improve fitness and overall functioning PT Goal Formulation: With patient Time For Goal Achievement: 10/28/21 Potential to Achieve Goals: Good    Frequency     Barriers to discharge        Co-evaluation               AM-PAC PT "6 Clicks" Mobility  Outcome Measure Help needed turning from your back to your side while in a flat bed without using bedrails?: None Help needed moving from lying on your back to sitting on the side of a flat bed without using bedrails?: None Help needed moving to and from a bed to a chair (including a wheelchair)?: None Help needed standing up from a chair using your arms (e.g., wheelchair or bedside chair)?: None Help needed to walk in hospital room?: A Little Help needed climbing 3-5 steps with a railing? : A Little 6 Click Score: 22    End of Session Equipment Utilized During Treatment: Gait belt Activity Tolerance:  Patient tolerated treatment well Patient left: in bed;Other (comment);with call bell/phone within reach (OT in room) Nurse Communication: Mobility status PT Visit Diagnosis: Unsteadiness on feet (R26.81);Muscle weakness (generalized) (M62.81)    Time: 4742-5956 PT Time Calculation (min) (ACUTE ONLY): 23 min   Charges:   PT Evaluation $PT Eval Moderate Complexity: 1 Mod PT Treatments $Therapeutic Activity: 8-22 mins        Basilia Jumbo PT, DPT 10/14/21 10:23 AM [phone]

## 2021-10-15 LAB — HEMOGLOBIN A1C
Hgb A1c MFr Bld: 4.2 % — ABNORMAL LOW (ref 4.8–5.6)
Mean Plasma Glucose: 74 mg/dL

## 2021-11-05 ENCOUNTER — Other Ambulatory Visit: Payer: Self-pay | Admitting: Obstetrics and Gynecology

## 2021-11-24 ENCOUNTER — Other Ambulatory Visit: Payer: Self-pay | Admitting: Obstetrics and Gynecology

## 2022-03-25 ENCOUNTER — Ambulatory Visit
Admission: EM | Admit: 2022-03-25 | Discharge: 2022-03-25 | Disposition: A | Discharge status: Home / Self Care | Payer: Medicaid Other

## 2022-03-25 ENCOUNTER — Other Ambulatory Visit: Payer: Self-pay

## 2022-03-25 ENCOUNTER — Encounter: Payer: Self-pay | Admitting: Emergency Medicine

## 2022-03-25 DIAGNOSIS — R051 Acute cough: Secondary | ICD-10-CM | POA: Diagnosis not present

## 2022-03-25 DIAGNOSIS — R0981 Nasal congestion: Secondary | ICD-10-CM | POA: Diagnosis not present

## 2022-03-25 DIAGNOSIS — J069 Acute upper respiratory infection, unspecified: Secondary | ICD-10-CM | POA: Diagnosis not present

## 2022-03-25 MED ORDER — BENZONATATE 200 MG PO CAPS: 200.0000 mg | ORAL_CAPSULE | Freq: Three times a day (TID) | ORAL | 0 refills | Status: AC | PRN

## 2022-03-25 MED ORDER — PROMETHAZINE-DM 6.25-15 MG/5ML PO SYRP: 5.0000 mL | ORAL_SOLUTION | Freq: Three times a day (TID) | ORAL | 0 refills | Status: AC | PRN

## 2022-03-25 MED ORDER — AMOXICILLIN-POT CLAVULANATE 875-125 MG PO TABS: 1.0000 | ORAL_TABLET | Freq: Two times a day (BID) | ORAL | 0 refills | Status: AC

## 2022-03-25 MED ORDER — IPRATROPIUM BROMIDE 0.06 % NA SOLN: 2.0000 | Freq: Four times a day (QID) | NASAL | 0 refills | Status: AC

## 2022-03-25 NOTE — ED Triage Notes (Signed)
Pt c/o cough, nasal congestion. Started about a week ago. Denies fever.  ?

## 2022-03-25 NOTE — ED Provider Notes (Signed)
?[facility] ? ? ? ?CSN: [id number] ?Arrival date & time: 03/25/22  1006 ? ? ?  ? ?History   ?Chief Complaint ?Chief Complaint  ?Patient presents with  ? Cough  ? ? ?HPI ?William Lynn is a 61 y.o. male with history of sarcoidosis affecting cardiac, pulmonary and neurologic systems.  Patient presenting today for 1 week history of productive cough and yellowish-green nasal congestion.  He denies any fever, fatigue, sinus pressure, chest pain or shortness of breath.  Reports that his son who is in pre-k was recently sick with a cough.  Patient has been taking over-the-counter NyQuil and says it helps temporarily with symptoms.  He denies any severe symptoms and says he does not feel that badly but he is concerned about how long he has been sick.  No other complaints. ? ?HPI ? ?Past Medical History:  ?Diagnosis Date  ? GERD (gastroesophageal reflux disease)   ? Neuropathy   ? Sarcoidosis   ? ? ?Patient Active Problem List  ? Diagnosis Date Noted  ? TIA (transient ischemic attack) 10/13/2021  ? 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  ? On corticosteroid therapy 06/21/2018  ? 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 Gait abnormality 02/25/2018  ? Low serum thyroid stimulating hormone (TSH) 02/25/2018  ? Serum gammaglobulin increased 02/25/2018  ? Muscle weakness (generalized) 06/07/2014  ? Closed fracture of glenoid cavity and neck of scapula 06/03/2014  ? S/P arthroscopy of shoulder 06/03/2014  ? Stiffness of joint, not elsewhere classified,  shoulder region 06/03/2014  ? ? ?Past Surgical History:  ?Procedure Laterality Date  ? HERNIA REPAIR    ? KNEE SURGERY Right   ? SHOULDER SURGERY Bilateral   ? ? ? ? ? ?Home Medications   ? ?Prior to Admission medications   ?Medication Sig Start Date End Date Taking? Authorizing Provider  ?alendronate (FOSAMAX) 70 MG tablet Take 70 mg by mouth every 7 (seven) days. Must sit or walk for 30 minutes after taking the tablet. 08/30/21  Yes [provider]   ?amoxicillin-clavulanate (AUGMENTIN) 875-125 MG tablet Take 1 tablet by mouth every 12 (twelve) hours for 7 days. 03/25/22 04/01/22 Yes Shirlee Latch, PA-C  ?benzonatate (TESSALON) 200 MG capsule Take 1 capsule (200 mg total) by mouth 3 (three) times daily as needed for cough. 03/25/22  Yes Shirlee Latch, PA-C  ?Cholecalciferol (VITAMIN D3) 50 MCG (2000 UT) TABS Take 4,000 Units by mouth daily.   Yes [provider]  ?clobetasol ointment (TEMOVATE) 0.05 % Apply 1 application topically daily as needed for rash. On back 05/14/21  Yes [provider]  ?dapsone 100 MG tablet Take 100 mg by mouth daily. 10/09/21  Yes [provider]  ?ipratropium (ATROVENT) 0.06 % nasal spray Place 2 sprays into both nostrils 4 (four) times daily. 03/25/22  Yes Eusebio Friendly B, PA-C  ?losartan (COZAAR) 25 MG tablet Take 1 tablet by mouth daily. 03/14/22 03/14/23 Yes [provider]  ?pantoprazole (PROTONIX) 20 MG tablet Take 20 mg by mouth daily. 11/03/18  Yes [provider]  ?predniSONE (DELTASONE) 10 MG tablet Take 10 mg by mouth daily. 09/21/21  Yes [provider]  ?pregabalin (LYRICA) 75 MG capsule Take 75 mg by mouth 2 (two) times daily. Take 75 mg in the morning and 150 mg at bedtime. 04/17/20  Yes [provider]  ?promethazine-dextromethorphan (PROMETHAZINE-DM) 6.25-15 MG/5ML syrup Take 5 mLs by mouth 3 (three) times daily as needed. 03/25/22  Yes Eusebio Friendly B, PA-C  ?tiZANidine (ZANAFLEX) 2  MG tablet Take 2 mg by mouth 3 (three) times daily. 09/25/21  Yes [provider]  ?aspirin EC 81 MG EC tablet Take 1 tablet (81 mg total) by mouth daily. Swallow whole. 10/15/21   Wouk, Wilfred CurtisNoah Bedford, MD  ?atorvastatin (LIPITOR) 80 MG tablet Take 1 tablet (80 mg total) by mouth daily. 10/15/21   Wouk, Wilfred CurtisNoah Bedford, MD  ?clopidogrel (PLAVIX) 75 MG tablet Take 1 tablet (75 mg total) by mouth daily. 10/14/21 10/14/22  Wouk, Wilfred CurtisNoah Bedford, MD  ? ? ?Family History ?Family History   ?Problem Relation Age of Onset  ? Other Mother   ?     unknown medical history  ? Other Father   ?     unknown medical history  ? ? ?Social History ?Social History  ? ?Tobacco Use  ? Smoking status: Former  ?  Types: Cigarettes  ?  Quit date: 05/12/1990  ?  Years since quitting: 31.8  ? Smokeless tobacco: Never  ? Tobacco comments:  ?  2-3 cigarette per day  ?Vaping Use  ? Vaping Use: Never used  ?Substance Use Topics  ? Alcohol use: Never  ? Drug use: Never  ? ? ? ?Allergies   ?Patient has no known allergies. ? ? ?Review of Systems ?Review of Systems  ?Constitutional:  Negative for chills, fatigue and fever.  ?HENT:  Positive for congestion, postnasal drip, rhinorrhea and sore throat. Negative for sinus pressure and sinus pain.   ?Respiratory:  Positive for cough. Negative for shortness of breath and wheezing.   ?Cardiovascular:  Negative for chest pain.  ?Gastrointestinal:  Negative for abdominal pain, diarrhea, nausea and vomiting.  ?Musculoskeletal:  Negative for myalgias.  ?Neurological:  Negative for weakness, light-headedness and headaches.  ?Hematological:  Negative for adenopathy.  ? ? ?Physical Exam ?Triage Vital Signs ?ED Triage Vitals  ?Enc Vitals Group  ?   BP 03/25/22 1024 131/82  ?   Pulse Rate 03/25/22 1024 86  ?   Resp 03/25/22 1024 18  ?   Temp 03/25/22 1024 98 ?F (36.7 ?C)  ?   Temp Source 03/25/22 1024 Oral  ?   SpO2 03/25/22 1024 93 %  ?   Weight 03/25/22 1022 231 lb 14.8 oz (105.2 kg)  ?   Height 03/25/22 1022 6\' 4"  (1.93 m)  ?   Head Circumference --   ?   Peak Flow --   ?   Pain Score 03/25/22 1021 0  ?   Pain Loc --   ?   Pain Edu? --   ?   Excl. in [place]? --   ? ?No data found. ? ?Updated Vital Signs ?BP 131/82 (BP Location: Left Arm)   Pulse 86   Temp 98 ?F (36.7 ?C) (Oral)   Resp 18   Ht 6\' 4"  (1.93 m)   Wt 231 lb 14.8 oz (105.2 kg)   SpO2 93%   BMI 28.23 kg/m?  ?  ? ?Physical Exam ?Vitals and nursing note reviewed.  ?Constitutional:   ?   General: He is not in acute distress. ?    Appearance: Normal appearance. He is well-developed. He is not ill-appearing.  ?HENT:  ?   Head: Normocephalic and atraumatic.  ?   Right Ear: Tympanic membrane, ear canal and external ear normal.  ?   Left Ear: Tympanic membrane, ear canal and external ear normal.  ?   Nose: Congestion present.  ?   Mouth/Throat:  ?   Mouth: Mucous membranes are moist.  ?  Pharynx: Oropharynx is clear. Posterior oropharyngeal erythema (mild with clear post nasal drainage) present.  ?Eyes:  ?   General: No scleral icterus. ?   Conjunctiva/sclera: Conjunctivae normal.  ?Cardiovascular:  ?   Rate and Rhythm: Normal rate and regular rhythm.  ?   Heart sounds: Normal heart sounds.  ?Pulmonary:  ?   Effort: Pulmonary effort is normal. No respiratory distress.  ?   Breath sounds: Normal breath sounds.  ?Musculoskeletal:  ?   Cervical back: Neck supple.  ?Skin: ?   General: Skin is warm and dry.  ?   Capillary Refill: Capillary refill takes less than 2 seconds.  ?Neurological:  ?   General: No focal deficit present.  ?   Mental Status: He is alert. Mental status is at baseline.  ?   Motor: No weakness.  ?   Coordination: Coordination normal.  ?Psychiatric:     ?   Mood and Affect: Mood normal.     ?   Behavior: Behavior normal.     ?   Thought Content: Thought content normal.  ? ? ? ?UC Treatments / Results  ?Labs ?(all labs ordered are listed, but only abnormal results are displayed) ?Labs Reviewed - No data to display ? ?EKG ? ? ?Radiology ?No results found. ? ?Procedures ?Procedures (including critical care time) ? ?Medications Ordered in UC ?Medications - No data to display ? ?Initial Impression / Assessment and Plan / UC Course  ?I have reviewed the triage vital signs and the nursing notes. ? ?Pertinent labs & imaging results that were available during my care of the patient were reviewed by me and considered in my medical decision making (see chart for details). ? ? 61 year old male presenting for cough and congestion x1 week.   Patient has history of sarcoidosis affecting heart and lungs.  Denies any shortness of breath from baseline.  Oxygen is 93% but he says it runs between 93 to 94% generally.  No associated fever.  He is overall well-appearing.  On

## 2022-03-25 NOTE — Discharge Instructions (Addendum)
URI/COLD SYMPTOMS: Your exam today is consistent with a viral illness. Antibiotics are not indicated at this time. Use medications as directed, including cough syrup, nasal saline, and decongestants. Your symptoms should improve over the next few days and resolve within 7-10 days. Increase rest and fluids. F/u if symptoms worsen or predominate such as sore throat, ear pain, productive cough, shortness of breath, or if you develop high fevers or worsening fatigue over the next several days.   ? ?If you are not feeling better in the next few days or feel worse, start the antibiotics.  ?

## 2022-10-02 ENCOUNTER — Ambulatory Visit
Admission: EM | Admit: 2022-10-02 | Discharge: 2022-10-02 | Disposition: A | Discharge status: Home / Self Care | Payer: Medicare Other | Attending: Family Medicine | Admitting: Family Medicine

## 2022-10-02 ENCOUNTER — Ambulatory Visit: Payer: Medicare Other

## 2022-10-02 DIAGNOSIS — I809 Phlebitis and thrombophlebitis of unspecified site: Secondary | ICD-10-CM

## 2022-10-02 NOTE — ED Provider Notes (Signed)
[facility]    CSN: [id number] Arrival date & time: 10/02/22  1339      History   Chief Complaint Chief Complaint  Patient presents with   Leg Pain    Calf     HPI William Lynn is a 61 y.o. male.   HPI  William Lynn presents for painful right calf that he noticed a knot in yesterday.  He has not had something like this before.  Denies any history of blood clots in his legs or his lungs.  He does take baby aspirin daily but does not take any anticoagulants.  Has noticed some intermittent swelling in his right ankle and foot but has none today.  It usually goes away by the end of the day.  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  Denies any fevers, bug bites, no arthralgias, nausea, chest pain, shortness of breath or sleep disturbance.     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 Authorizing Provider  alendronate (FOSAMAX) 70 MG tablet Take 70 mg by mouth every 7 (seven) days. Must sit or walk for 30 minutes after taking the tablet. 08/30/21  Yes [provider]   atorvastatin (LIPITOR) 80 MG tablet Take 1 tablet (80 mg total) by mouth daily. 10/15/21  Yes Wouk, Wilfred Curtis, MD  Cholecalciferol (VITAMIN D3) 50 MCG (2000 UT) TABS Take 4,000 Units by mouth daily.   Yes [provider]  clobetasol ointment (TEMOVATE) 0.05 % Apply 1 application topically daily as needed for rash. On back 05/14/21  Yes [provider]  dapsone 100 MG tablet Take 100 mg by mouth daily. 10/09/21  Yes [provider]  losartan (COZAAR) 25 MG tablet Take 1 tablet by mouth daily. 03/14/22 03/14/23 Yes [provider]  pantoprazole (PROTONIX) 20 MG tablet Take 20 mg by mouth daily. 11/03/18  Yes [provider]  predniSONE (DELTASONE) 10 MG tablet Take 10 mg by mouth daily. 09/21/21  Yes [provider]  pregabalin (LYRICA) 75 MG capsule Take 75 mg by mouth 2 (two) times daily. Take 75 mg in the morning and 150 mg at bedtime. 04/17/20  Yes [provider]  tiZANidine (ZANAFLEX) 2 MG tablet Take 2 mg by mouth 3 (three) times daily. 09/25/21  Yes [provider]  aspirin EC 81 MG EC tablet Take 1 tablet (81 mg total) by mouth daily. Swallow whole. 10/15/21   Wouk, Wilfred Curtis, MD  benzonatate (TESSALON) 200 MG capsule Take 1 capsule (200 mg total) by mouth 3 (three) times  daily as needed for cough. 03/25/22   Danton Clap, PA-C  clopidogrel (PLAVIX) 75 MG tablet Take 1 tablet (75 mg total) by mouth daily. 10/14/21 10/14/22  Wouk, Ailene Rud, MD  ipratropium (ATROVENT) 0.06 % nasal spray Place 2 sprays into both nostrils 4 (four) times daily. 03/25/22   Danton Clap, PA-C  promethazine-dextromethorphan (PROMETHAZINE-DM) 6.25-15 MG/5ML syrup Take 5 mLs by mouth 3 (three) times daily as needed. 03/25/22   Danton Clap, PA-C    Family History Family History  Problem Relation Age of Onset   Other Mother        unknown medical history   Other Father        unknown medical history    Social History Social  History   Tobacco Use   Smoking status: Former    Types: Cigarettes    Quit date: 05/12/1990    Years since quitting: 32.4   Smokeless tobacco: Never   Tobacco comments:    2-3 cigarette per day  Vaping Use   Vaping Use: Never used  Substance Use Topics   Alcohol use: Never   Drug use: Never     Allergies   Mycophenolate mofetil   Review of Systems Review of Systems : negative unless otherwise stated in HPI.      Physical Exam Triage Vital Signs ED Triage Vitals  Enc Vitals Group     BP 10/02/22 1435 133/73     Pulse Rate 10/02/22 1435 63     Resp --      Temp 10/02/22 1435 97.9 F (36.6 C)     Temp Source 10/02/22 1435 Oral     SpO2 10/02/22 1435 96 %     Weight 10/02/22 1433 204 lb (92.5 kg)     Height 10/02/22 1433 6\' 5"  (1.956 m)     Head Circumference --      Peak Flow --      Pain Score 10/02/22 1433 2     Pain Loc --      Pain Edu? --      Excl. in [place]? --    No data found.  Updated Vital Signs BP 133/73 (BP Location: Left Arm)   Pulse 63   Temp 97.9 F (36.6 C) (Oral)   Ht 6\' 5"  (1.956 m)   Wt 92.5 kg   SpO2 96%   BMI 24.19 kg/m   Visual Acuity Right Eye Distance:   Left Eye Distance:   Bilateral Distance:    Right Eye Near:   Left Eye Near:    Bilateral Near:     Physical Exam  GEN: alert, well appearing male, in no acute distress    HENT:  mucus membranes moist, no nasal discharge  EYES:   pupils equal and reactive, EOM intact NECK:  supple, normal ROM RESP:  clear to auscultation bilaterally, no increased work of breathing  CVS:   regular rate and rhythm, DP pulses equal and intact EXT:   normal ROM, atraumatic, no lower extremity edema or posterior calf tenderness NEURO: speech normal, alert and oriented   Skin:   warm and dry, varicose veins bilateral lower extremities, right inner calf with erythema, warmth and bulging area Psych: Normal affect, appropriate speech and behavior    UC Treatments / Results  Labs (all labs  ordered are listed, but only abnormal results are displayed) Labs Reviewed - No data to display  EKG   Radiology No results found.  Procedures Procedures (including critical care  time)  Medications Ordered in UC Medications - No data to display  Initial Impression / Assessment and Plan / UC Course  I have reviewed the triage vital signs and the nursing notes.  Pertinent labs & imaging results that were available during my care of the patient were reviewed by me and considered in my medical decision making (see chart for details).     Patient is a 61 year old male with history of smoking who presents for right calf pain.  He describes intermittent lower extremity swelling.  None new today though.  He has evidence of vascular disease in the lower extremities.  Exam concerning for phlebitis vs thrombosis.  Ordered lower extremity Doppler however ultrasound tech was unavailable to do exam in a timely manner and patient requested it to be scheduled for tomorrow.  ED precautions given and reviewed.  I suspect he may have a superficial DVT versus phlebitis/thrombophlebitis.  Recommended he purchase some compression socks and elevate his feet at rest.  He is to follow-up with his primary care physician.   Final diagnoses:  Phlebitis     Discharge Instructions      I recommend wearing compression socks and keeping her feet elevated when at rest.   Return tomorrow as scheduled for your ultrasound.       ED Prescriptions   None    PDMP not reviewed this encounter.   Katha Cabal, DO 10/02/22 2302

## 2022-10-02 NOTE — ED Triage Notes (Signed)
Pt c/o "knot" on RT calf noticed it yesterday,pt denies any hx of blood clots.

## 2022-10-02 NOTE — Discharge Instructions (Addendum)
I recommend wearing compression socks and keeping her feet elevated when at rest.   Return tomorrow as scheduled for your ultrasound.

## 2022-10-03 ENCOUNTER — Telehealth: Payer: Self-pay | Admitting: Emergency Medicine

## 2022-10-03 ENCOUNTER — Ambulatory Visit: Admission: RE | Admit: 2022-10-03 | Payer: Medicare Other | Source: Ambulatory Visit

## 2022-10-03 ENCOUNTER — Ambulatory Visit
Admission: RE | Admit: 2022-10-03 | Discharge: 2022-10-03 | Disposition: A | Discharge status: Home / Self Care | Payer: Medicare Other | Source: Ambulatory Visit | Attending: Emergency Medicine | Admitting: Emergency Medicine

## 2022-10-03 DIAGNOSIS — I82811 Embolism and thrombosis of superficial veins of right lower extremities: Secondary | ICD-10-CM | POA: Insufficient documentation

## 2022-10-03 DIAGNOSIS — M79661 Pain in right lower leg: Secondary | ICD-10-CM

## 2022-10-03 DIAGNOSIS — M79604 Pain in right leg: Secondary | ICD-10-CM | POA: Insufficient documentation

## 2022-10-03 NOTE — Telephone Encounter (Signed)
Order change to stat, requested change by radiology

## 2022-10-08 ENCOUNTER — Encounter: Payer: Self-pay | Admitting: Physical Therapy

## 2022-10-08 ENCOUNTER — Ambulatory Visit: Payer: Medicare Other | Attending: Internal Medicine | Admitting: Physical Therapy

## 2022-10-08 DIAGNOSIS — R2681 Unsteadiness on feet: Secondary | ICD-10-CM | POA: Insufficient documentation

## 2022-10-08 DIAGNOSIS — M6281 Muscle weakness (generalized): Secondary | ICD-10-CM | POA: Insufficient documentation

## 2022-10-08 DIAGNOSIS — R262 Difficulty in walking, not elsewhere classified: Secondary | ICD-10-CM | POA: Insufficient documentation

## 2022-10-08 DIAGNOSIS — M25562 Pain in left knee: Secondary | ICD-10-CM | POA: Insufficient documentation

## 2022-10-08 DIAGNOSIS — G8929 Other chronic pain: Secondary | ICD-10-CM | POA: Insufficient documentation

## 2022-10-08 NOTE — Therapy (Signed)
OUTPATIENT PHYSICAL THERAPY KNEE EVALUATION   Patient Name: Otoniel Myhand MRN: [id number] DOB:1961-08-11, 61 y.o., male Today's Date: 10/08/2022   PT End of Session - 10/08/22 1708     Visit Number 1    Number of Visits 13    Date for PT Re-Evaluation 11/19/22    Authorization Type eval 10/08/22    PT Start Time 0817    PT Stop Time 0905    PT Time Calculation (min) 48 min    Equipment Utilized During Treatment --   pt utlizies personal SPC for gait   Activity Tolerance Patient tolerated treatment well    Behavior During Therapy WFL for tasks assessed/performed             Past Medical History:  Diagnosis Date   GERD (gastroesophageal reflux disease)    Neuropathy    Sarcoidosis    Past Surgical History:  Procedure Laterality Date   HERNIA REPAIR     KNEE SURGERY Right    SHOULDER SURGERY Bilateral    Patient Active Problem List   Diagnosis Date Noted   TIA (transient ischemic attack) 10/13/2021   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   On corticosteroid therapy 06/21/2018   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   Gait abnormality 02/25/2018   Low serum thyroid stimulating hormone (TSH) 02/25/2018   Serum gammaglobulin increased 02/25/2018   Muscle weakness (generalized) 06/07/2014   Closed fracture of glenoid cavity and neck of scapula 06/03/2014   S/P arthroscopy of shoulder 06/03/2014   Stiffness of joint, not elsewhere classified,  shoulder region 06/03/2014    PCP: [facility]  REFERRING PROVIDER: Reesa Chew, MD  REFERRING DIAGNOSIS:  M25.562 (ICD-10-CM) - Pain in left knee  G89.29 (ICD-10-CM) - Other chronic pain  R29.898 (ICD-10-CM) - Other symptoms and signs involving the musculoskeletal system  M71.22 (ICD-10-CM) - Synovial cyst of popliteal space (Baker), left knee  M76.892 (ICD-10-CM) - Other specified enthesopathies of left lower limb, excluding foot    THERAPY DIAG: Chronic pain of left knee  Difficulty in walking, not  elsewhere classified  Muscle weakness (generalized)  RATIONALE FOR EVALUATION AND TREATMENT: Rehabilitation  ONSET DATE: 07/23/22  FOLLOW UP APPT WITH PROVIDER: Yes , patient to f/u with MD after PT   SUBJECTIVE:                                                                                                                                                                                         Chief Complaint: Patient is a 61 year old male referred for L knee pain with Hx of R knee surgery and hx of TIA associated with hypertension. Patient has Baker's cyst  L knee. Hx of L ACL tear with reconstruction about 30 years ago.   Pertinent History Patient is a 61 year old male referred for L knee pain with Hx of R knee surgery and hx of TIA associated with hypertension. Patient has Baker's cyst L knee. Hx of L ACL tear with reconstruction about 30 years ago.  Patient reports having blood clot in R calf confirmed with Doppler (superficial venous thrombosis in greater saphenous vein). Pt is using compression stockings at this time.  Pt has pain in posterior knee/popliteal fossa. Patient reports having "a little back pain." Patient is unsure of back pain happens with posterior knee pain. Patient reports disturbed sleep from leg cramps. Patient reports atraumatic onset of pain.   Pain:  Pain Intensity: Present: 0/10, Best: 0/10, Worst: 9/10 Pain location: Posterior knee/popliteal fossa Pain quality: previously sharp, feeling of "puffiness" in posterior knee/popliteal region  Radiating pain: No  Swelling: Yes , posterior knee  Popping, catching, locking: Yes , catching with leg rotating in closed-chain position  Numbness/Tingling: Yes, hx of long-standing numbness/tingling in toes from peripheral neuropathy  Focal weakness or buckling: No Aggravating factors: twisting leg in closed-chain flexion, standing, turning/pivoting, repetitive stair climbing or prolonged walking around house,  squatting/bending down  Relieving factors: sitting, non-weightbearing 24-hour pain behavior: continuous pain throughout day  How long can you stand: 20-30 minutes History of prior back, hip, or knee injury, pain, surgery, or therapy: Yes, hx of L ACL reconstruction 30 years, hx of back pain  Falls: Has patient fallen in last 6 months? No, Number of falls: N/A Follow-up appointment with MD: F/u with MD after PT Imaging: Yes , Ultrasound R LE  IMPRESSION 1. No evidence of deep venous thrombosis in the RIGHT lower extremity. 2. Superficial venous thrombosis of the greater saphenous vein.  Prior level of function: Independent Occupational demands: Retired Presenter, broadcasting: [employer], difficulty with yard work/cutting grass c push mower Red flags (bowel/bladder changes, saddle paresthesia, personal history of cancer, h/o spinal tumors, h/o compression fx, h/o abdominal aneurysm, abdominal pain, chills/fever, night sweats, nausea, vomiting, unrelenting pain): Negative  Precautions: Superficial venous thrombosis of the greater saphenous vein in RLE  Weight Bearing Restrictions: No  Living Environment Lives with: lives with their spouse, nephew and his brother lives close by Lives in: House/apartment, stairs to get in home   Patient Goals: Resolved pain and "puffiness" in knee    OBJECTIVE:   Patient Surveys  FOTO 45, predicted score of 46   Cognition Patient is oriented to person, place, and time.  Recent memory is intact.  Remote memory is intact.  Attention span and concentration are intact.  Expressive speech is intact.  Patient's fund of knowledge is within normal limits for educational level.     Gross Musculoskeletal Assessment Tremor: None Bulk: Normal Tone: Normal Pt presents with significant L knee genu valgum, L ankle pronation    GAIT: Distance walked: 50 ft Assistive device utilized: Single point cane Level of assistance: SBA Comments: Mild dynamic valgus  LLE, overpronation    Posture:    AROM  AROM (Normal range in degrees) AROM  10/08/2022  Lumbar   Flexion (65) WNL  Extension (30) WNL (pain in back)  Right lateral flexion (25) WNL  Left lateral flexion (25) WNL  Right rotation (30)   Left rotation (30)       Hip Right Left  Flexion (125)    Extension (15)    Abduction (40)    Adduction  Internal Rotation (45)    External Rotation (45)        Knee    Flexion (135) WNL 125  Extension (0) +10 +8      Ankle    Dorsiflexion (20)    Plantarflexion (50)    Inversion (35)    Eversion (15    (* = pain; Blank rows = not tested)   LE MMT:  MMT (out of 5) Right 10/08/2022 Left 10/08/2022  Hip flexion 5 4  Hip extension 4+ 4- (cramp)  Hip abduction 4+ 4+  Hip adduction    Hip internal rotation    Hip external rotation    Knee flexion 5 5  Knee extension 5 4+  Ankle dorsiflexion    Ankle plantarflexion    Ankle inversion    Ankle eversion    (* = pain; Blank rows = not tested)   Sensation Mild sensory loss in great toes to light touch bilateral LEs as determined by testing dermatomes L2-S2. Proprioception, and hot/cold testing deferred on this date.    Muscle Length Hamstrings: R: Not examined L: 10 deg lacking in 90/90 Quadriceps Pat Patrick): R: Positive L: Positive Hip flexors Marcello Moores): R: Not done L: Not done IT band Nicoletta Dress): R: Not done L: Not done   Palpation  Location LEFT  RIGHT           Quadriceps 0 0  Medial Hamstrings 0 0  Lateral Hamstrings 0 0  Lateral Hamstring tendon 0 0  Medial Hamstring tendon 0 0  Quadriceps tendon    Patella 0 0  Patellar Tendon    Tibial Tuberosity    Medial joint line 0 0  Lateral joint line 0 0  MCL 0 0  LCL 0 0  Adductor Tubercle    Pes Anserine tendon    Infrapatellar fat pad    Fibular head  0  Popliteal fossa  1 (mild)  (Blank rows = not tested) Graded on 0-4 scale (0 = no pain, 1 = pain, 2 = pain with wincing/grimacing/flinching, 3 = pain with  withdrawal, 4 = unwilling to allow palpation), (Blank rows = not tested)   Passive Accessory Motion  Patellofemoral: Superior Glide: R: Positive for moderate restriction L: Positive for moderate restriction Inferior Glide: R: Positive for moderate restriction L: Positive for moderate restriction Medial Glide: R: Negative L: Negative Lateral Glide: R: Negative L: Negative     SPECIAL TESTS  Lumbar Radiculopathy SLR: R: Negative, L: Negative SLUMP: R: Negative, : Negative Repeated flexion: mild discomfort in back during, no reproduction of concordant popliteal fossa pain   Meniscus Tests McMurray's Test:  Medial Meniscus (Tibial ER): R: Negative L: Negative Lateral Meniscus (Tibial IR): R: Negative L: Negative (-) meniscal signs per clinical prediction rule     FUNCTIONAL TASKS  Pt able to readily perform sit to stand with no upper extremity support     TODAY'S TREATMENT  Discussed use of seated hamstring stretch for home; 3 sets of 30 seconds 2-3x/daily. Formal HEP to be given next visit.    PATIENT EDUCATION:  Education details: Plan of care Person educated: Patient Education method: Explanation Education comprehension: verbalized understanding   HOME EXERCISE PROGRAM: None currently   ASSESSMENT:  CLINICAL IMPRESSION: Patient is a 61 y.o. 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 (-) testing for lumbar radiculopathy and no reproduction of  symptoms with lumbar spine repeated movements. Provocative tests are largely negative today, though pt's symptoms are currently managed with Rx medicine. Objective impairments include Abnormal gait, decreased balance, decreased strength, impaired flexibility, and pain. These impairments are limiting patient from community activity and yard work. Personal factors including 3+  comorbidities: (Hx of TIA, Hx of transverse myelitis, neuropathy, sarcoidosis)  are also affecting patient's functional outcome. Patient will benefit from skilled PT to address above impairments and improve overall function.  REHAB POTENTIAL: Good  CLINICAL DECISION MAKING: Evolving/moderate complexity  EVALUATION COMPLEXITY: Moderate   GOALS:  SHORT TERM GOALS: Target date: 10/31/2022  Pt will be independent with HEP to improve strength and decrease knee pain to improve pain-free function at home and work. Baseline: 10/08/22: Discussed stretching for home program, formal HEP to be given on visit # 2.  Goal status: INITIAL   LONG TERM GOALS: Target date: 11/21/2022  Pt will increase FOTO to at least 63 to demonstrate significant improvement in function at home and work related to knee pain  Baseline: 10/08/22: 45 Goal status: INITIAL  2.  Pt will decrease worst knee pain by at least 3 points on the NPRS in order to demonstrate clinically significant reduction in back pain. Baseline: 10/08/22: Knee pain 9/10 at worst Goal status: INITIAL  3.  Pt will increase strength of L hip and knee musculature measured at initial eval by at least 1/2 MMT grade in order to demonstrate improvement in strength and function as needed for improved ability to perform stair climbing and squatting Baseline: 10/08/22: Significant pain with repetitive stair climbing, squatting/bending down, and pivoting  Goal status: INITIAL  4.  Pt will tolerate standing activity up to 1 hour without exacerbation of knee pain as needed for completing community outings and errands in town Baseline: 10/08/22: Pt tolerate standing up to about 20-30 minutes per self-report  Goal status: INITIAL  5.  Pt will perform 3-way lunge without loss of frontal plane control and no LOB or knee buckling with no exacerbation of knee pain indicative of improved ability to complete multi-planar movements with weightbearing on LLE and  improved tolerance of pivoting as needed for functional mobility Baseline: 10/08/22: Significant pain with pivoting and turning when weightbearing on LLE Goal status: INITIAL  PLAN: PT FREQUENCY: 2x/week  PT DURATION: 6 weeks  PLANNED INTERVENTIONS: Therapeutic exercises, Therapeutic activity, Neuromuscular re-education, Balance training, Gait training, Patient/Family education, Electrical stimulation, Cryotherapy, Moist heat, Taping, and Manual therapy  PLAN FOR NEXT SESSION: STM/IASTM to improve soft tissue mobility and sensitivity along posterior thigh, check gastrocnemius/soleus flexibility. Progress with quadriceps and hip abductor/extensor/flexor strengthening. Progress to closed-chain movements as tolerated as weightbearing activity tolerance improves.    Consuela Mimes, PT, DPT #[id number]  Gertie Exon 10/08/2022, 5:10 PM

## 2022-10-10 ENCOUNTER — Ambulatory Visit: Payer: Medicare Other | Admitting: Physical Therapy

## 2022-10-10 DIAGNOSIS — R262 Difficulty in walking, not elsewhere classified: Secondary | ICD-10-CM

## 2022-10-10 DIAGNOSIS — R2681 Unsteadiness on feet: Secondary | ICD-10-CM

## 2022-10-10 DIAGNOSIS — G8929 Other chronic pain: Secondary | ICD-10-CM

## 2022-10-10 DIAGNOSIS — M25562 Pain in left knee: Secondary | ICD-10-CM | POA: Diagnosis not present

## 2022-10-10 DIAGNOSIS — M6281 Muscle weakness (generalized): Secondary | ICD-10-CM

## 2022-10-10 NOTE — Therapy (Signed)
OUTPATIENT PHYSICAL THERAPY TREATMENT NOTE   Patient Name: William Lynn MRN: [id number] DOB:Aug 10, 1961, 61 y.o., male Today's Date: 10/15/2022  [facility] REFERRING PROVIDER: Reesa ChewKathryne Bartolo, MD  END OF SESSION:   PT End of Session - 10/15/22 0540     Visit Number 2    Number of Visits 13    Date for PT Re-Evaluation 11/19/22    Authorization Type eval 10/08/22    PT Start Time 1116    PT Stop Time 1200    PT Time Calculation (min) 44 min    Equipment Utilized During Treatment --   pt utlizies personal SPC for gait   Activity Tolerance Patient tolerated treatment well    Behavior During Therapy WFL for tasks assessed/performed             Past Medical History:  Diagnosis Date   GERD (gastroesophageal reflux disease)    Neuropathy    Sarcoidosis    Past Surgical History:  Procedure Laterality Date   HERNIA REPAIR     KNEE SURGERY Right    SHOULDER SURGERY Bilateral    Patient Active Problem List   Diagnosis Date Noted   TIA (transient ischemic attack) 10/13/2021   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   On corticosteroid therapy 06/21/2018   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   Gait abnormality 02/25/2018   Low serum thyroid stimulating hormone (TSH) 02/25/2018   Serum gammaglobulin increased 02/25/2018   Muscle weakness (generalized) 06/07/2014   Closed fracture of glenoid cavity and neck of scapula 06/03/2014   S/P arthroscopy of shoulder 06/03/2014   Stiffness of joint, not elsewhere classified,  shoulder region 06/03/2014    REFERRING DIAG:  M25.562 (ICD-10-CM) - Pain in left knee  G89.29 (ICD-10-CM) - Other chronic pain  R29.898 (ICD-10-CM) - Other symptoms and signs involving the musculoskeletal system  M71.22 (ICD-10-CM) - Synovial cyst of popliteal space (Baker), left knee  M76.892 (ICD-10-CM) - Other specified enthesopathies of left lower limb, excluding foot    THERAPY DIAG:  Chronic pain of left knee  Difficulty in  walking, not elsewhere classified  Muscle weakness (generalized)  Unsteadiness on feet  Rationale for Evaluation and Treatment Rehabilitation  PERTINENT HISTORY: Patient is a 61 year old male referred for L knee pain with Hx of R knee surgery and hx of TIA associated with hypertension. Patient has Baker's cyst L knee. Hx of L ACL tear with reconstruction about 30 years ago.  Patient reports having blood clot in R calf confirmed with Doppler (superficial venous thrombosis in greater saphenous vein). Pt is using compression stockings at this time.  Pt has pain in posterior knee/popliteal fossa. Patient reports having "a little back pain." Patient is unsure of back pain happens with posterior knee pain. Patient reports disturbed sleep from leg cramps. Patient reports atraumatic onset of pain.    Pain:  Pain Intensity: Present: 0/10, Best: 0/10, Worst: 9/10 Pain location: Posterior knee/popliteal fossa Pain quality: previously sharp, feeling of "puffiness" in posterior knee/popliteal region  Radiating pain: No  Swelling: Yes , posterior knee  Popping, catching, locking: Yes , catching with leg rotating in closed-chain position  Numbness/Tingling: Yes, hx of long-standing numbness/tingling in toes from peripheral neuropathy  Focal weakness or buckling: No Aggravating factors: twisting leg in closed-chain flexion, standing, turning/pivoting, repetitive stair climbing or prolonged walking around house, squatting/bending down  Relieving factors: sitting, non-weightbearing 24-hour pain behavior: continuous pain throughout day  How long can you stand: 20-30 minutes History of prior back, hip, or knee injury, pain,  surgery, or therapy: Yes, hx of L ACL reconstruction 30 years, hx of back pain  Falls: Has patient fallen in last 6 months? No, Number of falls: N/A Follow-up appointment with MD: F/u with MD after PT Imaging: Yes , Ultrasound R LE   IMPRESSION 1. No evidence of deep venous thrombosis  in the RIGHT lower extremity. 2. Superficial venous thrombosis of the greater saphenous vein.   Prior level of function: Independent Occupational demands: Retired Presenter, broadcasting: [employer], difficulty with yard work/cutting grass c push mower Red flags (bowel/bladder changes, saddle paresthesia, personal history of cancer, h/o spinal tumors, h/o compression fx, h/o abdominal aneurysm, abdominal pain, chills/fever, night sweats, nausea, vomiting, unrelenting pain): Negative   Precautions: Superficial venous thrombosis of the greater saphenous vein in RLE   Weight Bearing Restrictions: No   Living Environment Lives with: lives with their spouse, nephew and his brother lives close by Lives in: House/apartment, stairs to get in home     Patient Goals: Resolved pain and "puffiness" in knee      PRECAUTIONS: Superficial venous thrombosis of the greater saphenous vein in RLE, Hx of transverse myelitis   OBJECTIVE: (objective measures completed at initial evaluation unless otherwise dated)  Patient Surveys  FOTO 45, predicted score of 39     Cognition Patient is oriented to person, place, and time.  Recent memory is intact.  Remote memory is intact.  Attention span and concentration are intact.  Expressive speech is intact.  Patient's fund of knowledge is within normal limits for educational level.                            Gross Musculoskeletal Assessment Tremor: None Bulk: Normal Tone: Normal Pt presents with significant L knee genu valgum, L ankle pronation      GAIT: Distance walked: 50 ft Assistive device utilized: Single point cane Level of assistance: SBA Comments: Mild dynamic valgus LLE, overpronation      AROM       AROM (Normal range in degrees) AROM  10/08/2022  Lumbar    Flexion (65) WNL  Extension (30) WNL (pain in back)  Right lateral flexion (25) WNL  Left lateral flexion (25) WNL  Right rotation (30)    Left rotation (30)                   Knee      Flexion (135) WNL 125  Extension (0) +10 +8         Ankle      Dorsiflexion (20)  WNL WNL  Plantarflexion (50)      Inversion (35)      Eversion (15      (* = pain; Blank rows = not tested)     LE MMT:   MMT (out of 5) Right 10/08/2022 Left 10/08/2022  Hip flexion 5 4  Hip extension 4+ 4- (cramp)  Hip abduction 4+ 4+  Hip adduction      Hip internal rotation      Hip external rotation      Knee flexion 5 5  Knee extension 5 4+  Ankle dorsiflexion      Ankle plantarflexion      Ankle inversion      Ankle eversion      (* = pain; Blank rows = not tested)     Sensation Mild sensory loss in great toes to light touch bilateral LEs as determined by testing dermatomes  L2-S2. Proprioception, and hot/cold testing deferred on this date.       Muscle Length Hamstrings: R: Not examined L: 10 deg lacking in 90/90 Quadriceps Pat Patrick): R: Positive L: Positive Hip flexors Marcello Moores): R: Not done L: Not done IT band Nicoletta Dress): R: Not done L: Not done Gastrocnemius: R: Normal  L: Normal  (10/09/22)   Palpation   Location LEFT  RIGHT           Quadriceps 0 0  Medial Hamstrings 0 0  Lateral Hamstrings 0 0  Lateral Hamstring tendon 0 0  Medial Hamstring tendon 0 0  Quadriceps tendon      Patella 0 0  Patellar Tendon      Tibial Tuberosity      Medial joint line 0 0  Lateral joint line 0 0  MCL 0 0  LCL 0 0  Adductor Tubercle      Pes Anserine tendon      Infrapatellar fat pad      Fibular head   0  Popliteal fossa   1 (mild)  (Blank rows = not tested) Graded on 0-4 scale (0 = no pain, 1 = pain, 2 = pain with wincing/grimacing/flinching, 3 = pain with withdrawal, 4 = unwilling to allow palpation), (Blank rows = not tested)     Passive Accessory Motion   Patellofemoral: Superior Glide: R: Positive for moderate restriction L: Positive for moderate restriction Inferior Glide: R: Positive for moderate restriction L: Positive for moderate restriction Medial Glide:  R: Negative L: Negative Lateral Glide: R: Negative L: Negative        SPECIAL TESTS   Lumbar Radiculopathy SLR: R: Negative, L: Negative SLUMP: R: Negative, : Negative Repeated flexion: mild discomfort in back during, no reproduction of concordant popliteal fossa pain    Meniscus Tests McMurray's Test:  Medial Meniscus (Tibial ER): R: Negative L: Negative Lateral Meniscus (Tibial IR): R: Negative L: Negative (-) meniscal signs per clinical prediction rule       FUNCTIONAL TASKS   Pt able to readily perform sit to stand with no upper extremity support         TODAY'S TREATMENT   SUBJECTIVE: Pt had phone visit yesterday for R leg pain and comorbid R hip pain; pt has Hx of superficial thrombosis in R calf. Patient reports mild pain affecting his L posterior knee at arrival to PT. Pt has been continuing with Rx medicine given by referring provider.   PAIN:  Are you having pain? Yes: NPRS scale: 2/10 Pain location: L popliteal fossa, comorbid R leg pain      Manual Therapy - for symptom modulation, soft tissue sensitivity and mobility, joint mobility, ROM   STM/IASTM L hamstrings and gastrocnemius x 10 minutes   Therapeutic Exercise - for improved soft tissue flexibility and extensibility as needed for ROM, improved strength as needed to improve performance of CKC activities/functional movements  NuStep, x 5 minutes; Level 2; for soft tissue extensibility/mobility  Seated hamstring stretch; 2x30sec - for review Straight leg raise; 2x10 - verbal cueing for maintaining quad contraction and eccentric control Sidelying hip abduction; 2x10 Prone Hip extension 2x10 alternating   Standing calf stretch, lunge with upper limb support on treadmill; 2x30 sec  -focused on LLE in back for L calf flexibility, pt has onset of L knee pain with LLE in front  Pt edu: Initiated [place] home exercise program and provided printout to pt    PATIENT EDUCATION:  Education details:  Plan of care, HEP  Person educated: Patient Education method: Explanation Education comprehension: verbalized understanding     HOME EXERCISE PROGRAM: Access Code: [email or web address] URL: [email or web address] Date: 10/15/2022 Prepared by: Consuela Mimes  Exercises - Seated Hamstring Stretch  - 2 x daily - 7 x weekly - 2 sets - 30sec hold - Gastroc Stretch on Wall  - 2 x daily - 7 x weekly - 3 sets - 30sec hold - Supine Active Straight Leg Raise  - 2 x daily - 7 x weekly - 2 sets - 10 reps     ASSESSMENT:   CLINICAL IMPRESSION: Patient fortunately reports improvement early in plan of care with relatively mild posterior knee pain on L today. Pt has been undergoing workup for comorbid R leg pain over this past week and voices more concerns with this today. Pt is notably challenged with [place] hip isotonics today, which can be partially attributed to deficits stemming from Hx of transverse myelitis. Pt does require cueing for placement of lower limbs in prone lying with LLE going over edge of bed and pt being unaware given apparent sensory and proprioception changes. Pt participates very well with physical therapy and is motivated to return to PLOF. Pt has remaining deficits in L hip flexor/gluteal and quad strength, mild hamstring flexibility deficit, medial hamstring and calf sensitivity on L, sensory/proprioception impairment of lower limbs, patellar hypomobility. Patient will benefit from continued skilled PT to address above impairments and improve overall function and QoL.    REHAB POTENTIAL: Good   CLINICAL DECISION MAKING: Evolving/moderate complexity   EVALUATION COMPLEXITY: Moderate     GOALS:   SHORT TERM GOALS: Target date: 10/31/2022   Pt will be independent with HEP to improve strength and decrease knee pain to improve pain-free function at home and work. Baseline: 10/08/22: Discussed stretching for home program, formal HEP to be given on visit # 2.  Goal status:  INITIAL     LONG TERM GOALS: Target date: 11/21/2022   Pt will increase FOTO to at least 63 to demonstrate significant improvement in function at home and work related to knee pain  Baseline: 10/08/22: 45 Goal status: INITIAL   2.  Pt will decrease worst knee pain by at least 3 points on the NPRS in order to demonstrate clinically significant reduction in back pain. Baseline: 10/08/22: Knee pain 9/10 at worst Goal status: INITIAL   3.  Pt will increase strength of L hip and knee musculature measured at initial eval by at least 1/2 MMT grade in order to demonstrate improvement in strength and function as needed for improved ability to perform stair climbing and squatting Baseline: 10/08/22: Significant pain with repetitive stair climbing, squatting/bending down, and pivoting  Goal status: INITIAL   4.  Pt will tolerate standing activity up to 1 hour without exacerbation of knee pain as needed for completing community outings and errands in town Baseline: 10/08/22: Pt tolerate standing up to about 20-30 minutes per self-report  Goal status: INITIAL   5.  Pt will perform 3-way lunge without loss of frontal plane control and no LOB or knee buckling with no exacerbation of knee pain indicative of improved ability to complete multi-planar movements with weightbearing on LLE and improved tolerance of pivoting as needed for functional mobility Baseline: 10/08/22: Significant pain with pivoting and turning when weightbearing on LLE Goal status: INITIAL   PLAN: PT FREQUENCY: 2x/week   PT DURATION: 6 weeks   PLANNED INTERVENTIONS: Therapeutic exercises, Therapeutic activity, Neuromuscular re-education, Balance training, Gait training, Patient/Family education, Lobbyist  stimulation, Cryotherapy, Moist heat, Taping, and Manual therapy   PLAN FOR NEXT SESSION: STM/IASTM to improve soft tissue mobility and sensitivity along posterior thigh. Progress with quadriceps and hip abductor/extensor/flexor  strengthening. Progress to closed-chain movements as tolerated as weightbearing activity tolerance improves.      Consuela Mimes, PT, DPT #[id number]  Gertie Exon, PT 10/15/2022, 5:40 AM

## 2022-10-15 ENCOUNTER — Ambulatory Visit: Payer: Medicare Other | Admitting: Physical Therapy

## 2022-10-15 ENCOUNTER — Encounter: Payer: Self-pay | Admitting: Physical Therapy

## 2022-10-15 DIAGNOSIS — M6281 Muscle weakness (generalized): Secondary | ICD-10-CM

## 2022-10-15 DIAGNOSIS — R262 Difficulty in walking, not elsewhere classified: Secondary | ICD-10-CM

## 2022-10-15 DIAGNOSIS — M25562 Pain in left knee: Secondary | ICD-10-CM | POA: Diagnosis not present

## 2022-10-15 DIAGNOSIS — R2681 Unsteadiness on feet: Secondary | ICD-10-CM

## 2022-10-15 DIAGNOSIS — G8929 Other chronic pain: Secondary | ICD-10-CM

## 2022-10-15 NOTE — Therapy (Signed)
OUTPATIENT PHYSICAL THERAPY TREATMENT NOTE   Patient Name: William Lynn MRN: [id number] DOB:1961-01-09, 61 y.o., male Today's Date: 10/17/2022  PCP: [facility] REFERRING PROVIDER: Reesa Chew, MD  END OF SESSION:   PT End of Session - 10/17/22 0508     Visit Number 3    Number of Visits 13    Date for PT Re-Evaluation 11/19/22    Authorization Type eval 10/08/22    PT Start Time 0815    PT Stop Time 0859    PT Time Calculation (min) 44 min    Equipment Utilized During Treatment --   pt utlizies personal SPC for gait intermittently during episodes of heightened pain   Activity Tolerance Patient tolerated treatment well    Behavior During Therapy WFL for tasks assessed/performed              Past Medical History:  Diagnosis Date   GERD (gastroesophageal reflux disease)    Neuropathy    Sarcoidosis    Past Surgical History:  Procedure Laterality Date   HERNIA REPAIR     KNEE SURGERY Right    SHOULDER SURGERY Bilateral    Patient Active Problem List   Diagnosis Date Noted   TIA (transient ischemic attack) 10/13/2021   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   On corticosteroid therapy 06/21/2018   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   Gait abnormality 02/25/2018   Low serum thyroid stimulating hormone (TSH) 02/25/2018   Serum gammaglobulin increased 02/25/2018   Muscle weakness (generalized) 06/07/2014   Closed fracture of glenoid cavity and neck of scapula 06/03/2014   S/P arthroscopy of shoulder 06/03/2014   Stiffness of joint, not elsewhere classified,  shoulder region 06/03/2014    REFERRING DIAG:  M25.562 (ICD-10-CM) - Pain in left knee  G89.29 (ICD-10-CM) - Other chronic pain  R29.898 (ICD-10-CM) - Other symptoms and signs involving the musculoskeletal system  M71.22 (ICD-10-CM) - Synovial cyst of popliteal space (Baker), left knee  M76.892 (ICD-10-CM) - Other specified enthesopathies of left lower limb, excluding foot    THERAPY  DIAG:  Chronic pain of left knee  Difficulty in walking, not elsewhere classified  Muscle weakness (generalized)  Unsteadiness on feet  Rationale for Evaluation and Treatment Rehabilitation  PERTINENT HISTORY: Patient is a 61 year old male referred for L knee pain with Hx of R knee surgery and hx of TIA associated with hypertension. Patient has Baker's cyst L knee. Hx of L ACL tear with reconstruction about 30 years ago.  Patient reports having blood clot in R calf confirmed with Doppler (superficial venous thrombosis in greater saphenous vein). Pt is using compression stockings at this time.  Pt has pain in posterior knee/popliteal fossa. Patient reports having "a little back pain." Patient is unsure of back pain happens with posterior knee pain. Patient reports disturbed sleep from leg cramps. Patient reports atraumatic onset of pain.    Pain:  Pain Intensity: Present: 0/10, Best: 0/10, Worst: 9/10 Pain location: Posterior knee/popliteal fossa Pain quality: previously sharp, feeling of "puffiness" in posterior knee/popliteal region  Radiating pain: No  Swelling: Yes , posterior knee  Popping, catching, locking: Yes , catching with leg rotating in closed-chain position  Numbness/Tingling: Yes, hx of long-standing numbness/tingling in toes from peripheral neuropathy  Focal weakness or buckling: No Aggravating factors: twisting leg in closed-chain flexion, standing, turning/pivoting, repetitive stair climbing or prolonged walking around house, squatting/bending down  Relieving factors: sitting, non-weightbearing 24-hour pain behavior: continuous pain throughout day  How long can you stand: 20-30 minutes History of  prior back, hip, or knee injury, pain, surgery, or therapy: Yes, hx of L ACL reconstruction 30 years, hx of back pain  Falls: Has patient fallen in last 6 months? No, Number of falls: N/A Follow-up appointment with MD: F/u with MD after PT Imaging: Yes , Ultrasound R LE    IMPRESSION 1. No evidence of deep venous thrombosis in the RIGHT lower extremity. 2. Superficial venous thrombosis of the greater saphenous vein.   Prior level of function: Independent Occupational demands: Retired Presenter, broadcastingHobbies: McGraw-HillPlay video games, difficulty with yard work/cutting grass c push mower Red flags (bowel/bladder changes, saddle paresthesia, personal history of cancer, h/o spinal tumors, h/o compression fx, h/o abdominal aneurysm, abdominal pain, chills/fever, night sweats, nausea, vomiting, unrelenting pain): Negative   Precautions: Superficial venous thrombosis of the greater saphenous vein in RLE   Weight Bearing Restrictions: No   Living Environment Lives with: lives with their spouse, nephew and his brother lives close by Lives in: House/apartment, stairs to get in home     Patient Goals: Resolved pain and "puffiness" in knee      PRECAUTIONS: Superficial venous thrombosis of the greater saphenous vein in RLE, Hx of transverse myelitis   OBJECTIVE: (objective measures completed at initial evaluation unless otherwise dated)  Patient Surveys  FOTO 45, predicted score of 6063     Cognition Patient is oriented to person, place, and time.  Recent memory is intact.  Remote memory is intact.  Attention span and concentration are intact.  Expressive speech is intact.  Patient's fund of knowledge is within normal limits for educational level.                            Gross Musculoskeletal Assessment Tremor: None Bulk: Normal Tone: Normal Pt presents with significant L knee genu valgum, L ankle pronation      GAIT: Distance walked: 50 ft Assistive device utilized: Single point cane Level of assistance: SBA Comments: Mild dynamic valgus LLE, overpronation      AROM       AROM (Normal range in degrees) AROM  10/08/2022  Lumbar    Flexion (65) WNL  Extension (30) WNL (pain in back)  Right lateral flexion (25) WNL  Left lateral flexion (25) WNL  Right  rotation (30)    Left rotation (30)                  Knee      Flexion (135) WNL 125  Extension (0) +10 +8         Ankle      Dorsiflexion (20)  WNL WNL  Plantarflexion (50)      Inversion (35)      Eversion (15      (* = pain; Blank rows = not tested)     LE MMT:   MMT (out of 5) Right 10/08/2022 Left 10/08/2022  Hip flexion 5 4  Hip extension 4+ 4- (cramp)  Hip abduction 4+ 4+  Hip adduction      Hip internal rotation      Hip external rotation      Knee flexion 5 5  Knee extension 5 4+  Ankle dorsiflexion      Ankle plantarflexion      Ankle inversion      Ankle eversion      (* = pain; Blank rows = not tested)     Sensation Mild sensory loss in great toes to light touch  bilateral LEs as determined by testing dermatomes L2-S2. Proprioception, and hot/cold testing deferred on this date.       Muscle Length Hamstrings: R: Not examined L: 10 deg lacking in 90/90 Quadriceps Michela Pitcher): R: Positive L: Positive Hip flexors Maisie Fus): R: Not done L: Not done IT band Claiborne Rigg): R: Not done L: Not done Gastrocnemius: R: Normal  L: Normal  (10/09/22)   Palpation   Location LEFT  RIGHT           Quadriceps 0 0  Medial Hamstrings 0 0  Lateral Hamstrings 0 0  Lateral Hamstring tendon 0 0  Medial Hamstring tendon 0 0  Quadriceps tendon      Patella 0 0  Patellar Tendon      Tibial Tuberosity      Medial joint line 0 0  Lateral joint line 0 0  MCL 0 0  LCL 0 0  Adductor Tubercle      Pes Anserine tendon      Infrapatellar fat pad      Fibular head   0  Popliteal fossa   1 (mild)  (Blank rows = not tested) Graded on 0-4 scale (0 = no pain, 1 = pain, 2 = pain with wincing/grimacing/flinching, 3 = pain with withdrawal, 4 = unwilling to allow palpation), (Blank rows = not tested)     Passive Accessory Motion   Patellofemoral: Superior Glide: R: Positive for moderate restriction L: Positive for moderate restriction Inferior Glide: R: Positive for moderate  restriction L: Positive for moderate restriction Medial Glide: R: Negative L: Negative Lateral Glide: R: Negative L: Negative        SPECIAL TESTS   Lumbar Radiculopathy SLR: R: Negative, L: Negative SLUMP: R: Negative, : Negative Repeated flexion: mild discomfort in back during, no reproduction of concordant popliteal fossa pain    Meniscus Tests McMurray's Test:  Medial Meniscus (Tibial ER): R: Negative L: Negative Lateral Meniscus (Tibial IR): R: Negative L: Negative (-) meniscal signs per clinical prediction rule       FUNCTIONAL TASKS   Pt able to readily perform sit to stand with no upper extremity support         TODAY'S TREATMENT   SUBJECTIVE: Pt reports his L posterior knee is not bothering him at arrival. Patient reports pain in knees toward end of warm-up on NuStep. Patient reports tolerating last session well. He is compliant with HEP.   PAIN:  Are you having pain? No pain at arrival Pain location: L popliteal fossa, comorbid R leg pain      Manual Therapy - for symptom modulation, soft tissue sensitivity and mobility, joint mobility, ROM   Patellar mobilization; gr III with emphasis on superior/inferior gliding; 3x30 sec bouts each direction  STM/IASTM L hamstrings and gastrocnemius x 10 minutes   Therapeutic Exercise - for improved soft tissue flexibility and extensibility as needed for ROM, improved strength as needed to improve performance of CKC activities/functional movements  NuStep, x 5 minutes; Level 2; for soft tissue extensibility/mobility  Seated hamstring stretch; 2x30sec - for review Straight leg raise; 1x10, with no weight, 2x10 with 2-lb ankle weight - verbal cueing for maintaining quad contraction and eccentric control Sidelying hip abduction; 2x10 Prone Hip extension 2x10 alternating   [employer] double-limb squat, Level 22; depth to tolerance; 2x10  *not today* Standing calf stretch, lunge with upper limb support on treadmill;  2x30 sec  -focused on LLE in back for L calf flexibility, pt has onset of L knee  pain with LLE in front    PATIENT EDUCATION:  Education details: Exercise technique as needed for carryover to HEP Person educated: Patient Education method: Explanation Education comprehension: verbalized understanding     HOME EXERCISE PROGRAM: Access Code: [email or web address] URL: [email or web address] Date: 10/15/2022 Prepared by: Valentina Gu  Exercises - Seated Hamstring Stretch  - 2 x daily - 7 x weekly - 2 sets - 30sec hold - Gastroc Stretch on Wall  - 2 x daily - 7 x weekly - 3 sets - 30sec hold - Supine Active Straight Leg Raise  - 2 x daily - 7 x weekly - 2 sets - 10 reps     ASSESSMENT:   CLINICAL IMPRESSION: Patient has tolerated recent progressions of exercises well without significant reoccurrence of pain along L popliteal fossa region. Comorbid RLE and R low back pain has been gradually lessening. Patient is able to perform bilateral squat with bodyweight partially eliminated today and will gradually progress with closed-chain movements with successive visits. Pt has remaining deficits in L hip flexor/gluteal and quad strength, mild hamstring flexibility deficit, medial hamstring and calf sensitivity on L, sensory/proprioception impairment of lower limbs, patellar hypomobility. Patient will benefit from continued skilled PT to address above impairments and improve overall function and QoL.    REHAB POTENTIAL: Good   CLINICAL DECISION MAKING: Evolving/moderate complexity   EVALUATION COMPLEXITY: Moderate     GOALS:   SHORT TERM GOALS: Target date: 10/31/2022   Pt will be independent with HEP to improve strength and decrease knee pain to improve pain-free function at home and work. Baseline: 10/08/22: Discussed stretching for home program, formal HEP to be given on visit # 2.  Goal status: INITIAL     LONG TERM GOALS: Target date: 11/21/2022   Pt will increase FOTO to at  least 63 to demonstrate significant improvement in function at home and work related to knee pain  Baseline: 10/08/22: 45 Goal status: INITIAL   2.  Pt will decrease worst knee pain by at least 3 points on the NPRS in order to demonstrate clinically significant reduction in back pain. Baseline: 10/08/22: Knee pain 9/10 at worst Goal status: INITIAL   3.  Pt will increase strength of L hip and knee musculature measured at initial eval by at least 1/2 MMT grade in order to demonstrate improvement in strength and function as needed for improved ability to perform stair climbing and squatting Baseline: 10/08/22: Significant pain with repetitive stair climbing, squatting/bending down, and pivoting  Goal status: INITIAL   4.  Pt will tolerate standing activity up to 1 hour without exacerbation of knee pain as needed for completing community outings and errands in town Baseline: 10/08/22: Pt tolerate standing up to about 20-30 minutes per self-report  Goal status: INITIAL   5.  Pt will perform 3-way lunge without loss of frontal plane control and no LOB or knee buckling with no exacerbation of knee pain indicative of improved ability to complete multi-planar movements with weightbearing on LLE and improved tolerance of pivoting as needed for functional mobility Baseline: 10/08/22: Significant pain with pivoting and turning when weightbearing on LLE Goal status: INITIAL   PLAN: PT FREQUENCY: 2x/week   PT DURATION: 6 weeks   PLANNED INTERVENTIONS: Therapeutic exercises, Therapeutic activity, Neuromuscular re-education, Balance training, Gait training, Patient/Family education, Electrical stimulation, Cryotherapy, Moist heat, Taping, and Manual therapy   PLAN FOR NEXT SESSION: STM/IASTM to improve soft tissue mobility and sensitivity along posterior thigh. Progress with quadriceps and hip abductor/extensor/flexor strengthening.  Progress to closed-chain movements as tolerated as weightbearing  activity tolerance improves.      Consuela Mimes, PT, DPT #[id number]  Gertie Exon, PT 10/17/2022, 5:09 AM

## 2022-10-17 ENCOUNTER — Ambulatory Visit: Payer: Medicare Other | Admitting: Physical Therapy

## 2022-10-17 ENCOUNTER — Encounter: Payer: Self-pay | Admitting: Physical Therapy

## 2022-10-17 DIAGNOSIS — M25562 Pain in left knee: Secondary | ICD-10-CM | POA: Diagnosis not present

## 2022-10-17 DIAGNOSIS — G8929 Other chronic pain: Secondary | ICD-10-CM

## 2022-10-17 DIAGNOSIS — M6281 Muscle weakness (generalized): Secondary | ICD-10-CM

## 2022-10-17 DIAGNOSIS — R262 Difficulty in walking, not elsewhere classified: Secondary | ICD-10-CM

## 2022-10-17 NOTE — Therapy (Signed)
OUTPATIENT PHYSICAL THERAPY TREATMENT NOTE   Patient Name: William Lynn MRN: [id number] DOB:12/02/1961, 61 y.o., male Today's Date: 10/19/2022  PCP: [facility] REFERRING PROVIDER: Darrol Jump, MD  END OF SESSION:   PT End of Session - 10/19/22 1518     Visit Number 4    Number of Visits 13    Date for PT Re-Evaluation 11/19/22    Authorization Type eval 10/08/22    PT Start Time 0947    PT Stop Time 1035    PT Time Calculation (min) 48 min    Equipment Utilized During Treatment --   pt utlizies personal SPC for gait intermittently during episodes of heightened pain   Activity Tolerance Patient tolerated treatment well    Behavior During Therapy WFL for tasks assessed/performed               Past Medical History:  Diagnosis Date   GERD (gastroesophageal reflux disease)    Neuropathy    Sarcoidosis    Past Surgical History:  Procedure Laterality Date   HERNIA REPAIR     KNEE SURGERY Right    SHOULDER SURGERY Bilateral    Patient Active Problem List   Diagnosis Date Noted   TIA (transient ischemic attack) 10/13/2021   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   On corticosteroid therapy 06/21/2018   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   Gait abnormality 02/25/2018   Low serum thyroid stimulating hormone (TSH) 02/25/2018   Serum gammaglobulin increased 02/25/2018   Muscle weakness (generalized) 06/07/2014   Closed fracture of glenoid cavity and neck of scapula 06/03/2014   S/P arthroscopy of shoulder 06/03/2014   Stiffness of joint, not elsewhere classified,  shoulder region 06/03/2014    REFERRING DIAG:  M25.562 (ICD-10-CM) - Pain in left knee  G89.29 (ICD-10-CM) - Other chronic pain  R29.898 (ICD-10-CM) - Other symptoms and signs involving the musculoskeletal system  M71.22 (ICD-10-CM) - Synovial cyst of popliteal space (Baker), left knee  M76.892 (ICD-10-CM) - Other specified enthesopathies of left lower limb, excluding foot    THERAPY  DIAG:  Chronic pain of left knee  Difficulty in walking, not elsewhere classified  Muscle weakness (generalized)  Rationale for Evaluation and Treatment Rehabilitation  PERTINENT HISTORY: Patient is a 61 year old male referred for L knee pain with Hx of R knee surgery and hx of TIA associated with hypertension. Patient has Baker's cyst L knee. Hx of L ACL tear with reconstruction about 30 years ago.  Patient reports having blood clot in R calf confirmed with Doppler (superficial venous thrombosis in greater saphenous vein). Pt is using compression stockings at this time.  Pt has pain in posterior knee/popliteal fossa. Patient reports having "a little back pain." Patient is unsure of back pain happens with posterior knee pain. Patient reports disturbed sleep from leg cramps. Patient reports atraumatic onset of pain.    Pain:  Pain Intensity: Present: 0/10, Best: 0/10, Worst: 9/10 Pain location: Posterior knee/popliteal fossa Pain quality: previously sharp, feeling of "puffiness" in posterior knee/popliteal region  Radiating pain: No  Swelling: Yes , posterior knee  Popping, catching, locking: Yes , catching with leg rotating in closed-chain position  Numbness/Tingling: Yes, hx of long-standing numbness/tingling in toes from peripheral neuropathy  Focal weakness or buckling: No Aggravating factors: twisting leg in closed-chain flexion, standing, turning/pivoting, repetitive stair climbing or prolonged walking around house, squatting/bending down  Relieving factors: sitting, non-weightbearing 24-hour pain behavior: continuous pain throughout day  How long can you stand: 20-30 minutes History of prior back, hip,  or knee injury, pain, surgery, or therapy: Yes, hx of L ACL reconstruction 30 years, hx of back pain  Falls: Has patient fallen in last 6 months? No, Number of falls: N/A Follow-up appointment with MD: F/u with MD after PT Imaging: Yes , Ultrasound R LE   IMPRESSION 1. No evidence  of deep venous thrombosis in the RIGHT lower extremity. 2. Superficial venous thrombosis of the greater saphenous vein.   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[employer], difficulty with yard work/cutting grass c push mower Red flags (bowel/bladder changes, saddle paresthesia, personal history of cancer, h/o spinal tumors, h/o compression fx, h/o abdominal aneurysm, abdominal pain, chills/fever, night sweats, nausea, vomiting, unrelenting pain): Negative   Precautions: Superficial venous thrombosis of the greater saphenous vein in RLE   Weight Bearing Restrictions: No   Living Environment Lives with: lives with their spouse, nephew and his brother lives close by Lives in: House/apartment, stairs to get in home     Patient Goals: Resolved pain and "puffiness" in knee      PRECAUTIONS: Superficial venous thrombosis of the greater saphenous vein in RLE, Hx of transverse myelitis   OBJECTIVE: (objective measures completed at initial evaluation unless otherwise dated)  Patient Surveys  FOTO 45, predicted score of 14     Cognition Patient is oriented to person, place, and time.  Recent memory is intact.  Remote memory is intact.  Attention span and concentration are intact.  Expressive speech is intact.  Patient's fund of knowledge is within normal limits for educational level.                            Gross Musculoskeletal Assessment Tremor: None Bulk: Normal Tone: Normal Pt presents with significant L knee genu valgum, L ankle pronation      GAIT: Distance walked: 50 ft Assistive device utilized: Single point cane Level of assistance: SBA Comments: Mild dynamic valgus LLE, overpronation      AROM       AROM (Normal range in degrees) AROM  10/08/2022  Lumbar    Flexion (65) WNL  Extension (30) WNL (pain in back)  Right lateral flexion (25) WNL  Left lateral flexion (25) WNL  Right rotation (30)    Left  rotation (30)                  Knee      Flexion (135) WNL 125  Extension (0) +10 +8         Ankle      Dorsiflexion (20)  WNL WNL  Plantarflexion (50)      Inversion (35)      Eversion (15      (* = pain; Blank rows = not tested)     LE MMT:   MMT (out of 5) Right 10/08/2022 Left 10/08/2022  Hip flexion 5 4  Hip extension 4+ 4- (cramp)  Hip abduction 4+ 4+  Hip adduction      Hip internal rotation      Hip external rotation      Knee flexion 5 5  Knee extension 5 4+  Ankle dorsiflexion      Ankle plantarflexion      Ankle inversion      Ankle eversion      (* = pain; Blank rows = not tested)     Sensation Mild sensory loss in great toes to light touch bilateral LEs as  determined by testing dermatomes L2-S2. Proprioception, and hot/cold testing deferred on this date.       Muscle Length Hamstrings: R: Not examined L: 10 deg lacking in 90/90 Quadriceps Pat Patrick): R: Positive L: Positive Hip flexors Marcello Moores): R: Not done L: Not done IT band Nicoletta Dress): R: Not done L: Not done Gastrocnemius: R: Normal  L: Normal  (10/09/22)   Palpation   Location LEFT  RIGHT           Quadriceps 0 0  Medial Hamstrings 0 0  Lateral Hamstrings 0 0  Lateral Hamstring tendon 0 0  Medial Hamstring tendon 0 0  Quadriceps tendon      Patella 0 0  Patellar Tendon      Tibial Tuberosity      Medial joint line 0 0  Lateral joint line 0 0  MCL 0 0  LCL 0 0  Adductor Tubercle      Pes Anserine tendon      Infrapatellar fat pad      Fibular head   0  Popliteal fossa   1 (mild)  (Blank rows = not tested) Graded on 0-4 scale (0 = no pain, 1 = pain, 2 = pain with wincing/grimacing/flinching, 3 = pain with withdrawal, 4 = unwilling to allow palpation), (Blank rows = not tested)     Passive Accessory Motion   Patellofemoral: Superior Glide: R: Positive for moderate restriction L: Positive for moderate restriction Inferior Glide: R: Positive for moderate restriction L: Positive for  moderate restriction Medial Glide: R: Negative L: Negative Lateral Glide: R: Negative L: Negative        SPECIAL TESTS   Lumbar Radiculopathy SLR: R: Negative, L: Negative SLUMP: R: Negative, : Negative Repeated flexion: mild discomfort in back during, no reproduction of concordant popliteal fossa pain    Meniscus Tests McMurray's Test:  Medial Meniscus (Tibial ER): R: Negative L: Negative Lateral Meniscus (Tibial IR): R: Negative L: Negative (-) meniscal signs per clinical prediction rule       FUNCTIONAL TASKS   Pt able to readily perform sit to stand with no upper extremity support         TODAY'S TREATMENT   SUBJECTIVE: Pt reports his L posterior knee is not bothering him at arrival. He reports significant L anterior knee pain and crepitus today. He reports having difficulty walking in afternoon following his previous session. He reports limiting his HEP the previous day.   PAIN:  Are you having pain? 5/10 Pain location: L anterior knee/joint line       Manual Therapy - for symptom modulation, soft tissue sensitivity and mobility, joint mobility, ROM   Patellar mobilization; gr III with emphasis on superior/inferior gliding; 3x30 sec bouts each direction  STM/IASTM L hamstrings and gastrocnemius x 10 minutes   Therapeutic Exercise - for improved soft tissue flexibility and extensibility as needed for ROM, improved strength as needed to improve performance of CKC activities/functional movements  NuStep, x 5 minutes; Level 3, adjusted to Level 2; for soft tissue extensibility/mobility  Straight leg raise; 2x8 with 2-lb ankle weight - verbal cueing for maintaining quad contraction and eccentric control Sidelying hip abduction; 2x8 Glute bridge; 2x10   *not today* Seated hamstring stretch; 2x30sec - for review Prone Hip extension 2x10 alternating  [employer] double-limb squat, Level 22; depth to tolerance; 2x10 Standing calf stretch, lunge with upper limb  support on treadmill; 2x30 sec  -focused on LLE in back for L calf flexibility, pt has onset of  L knee pain with LLE in front    Cold pack (unbilled) - for anti-inflammatory and analgesic effect as needed for reduced pain and improved ability to participate in active PT intervention, along L knee in supine, x 5 minutes   PATIENT EDUCATION:  Education details: Exercise technique as needed for carryover to HEP Person educated: Patient Education method: Explanation Education comprehension: verbalized understanding     HOME EXERCISE PROGRAM: Access Code: [email or web address] URL: [email or web address] Date: 10/15/2022 Prepared by: Valentina Gu  Exercises - Seated Hamstring Stretch  - 2 x daily - 7 x weekly - 2 sets - 30sec hold - Gastroc Stretch on Wall  - 2 x daily - 7 x weekly - 3 sets - 30sec hold - Supine Active Straight Leg Raise  - 2 x daily - 7 x weekly - 2 sets - 10 reps     ASSESSMENT:   CLINICAL IMPRESSION: Reduced load and volume of exercise today and utilized ice pack to reduce L knee infrapatellar swelling. Patient has participated well with PT to date and has done well with exercises prescribed until last visit. He reports anterior knee pain and difficulty with weightbearing in the afternoon following last visit and arrives with [facility] to offload affected knee today. Pt is able to complete session today with modifications without notable pain acutely - only mm fatigue as expected. Will continue to monitor pt response with future sessions and modify program prn. Pt has remaining deficits in L hip flexor/gluteal and quad strength, mild hamstring flexibility deficit, medial hamstring and calf sensitivity on L, sensory/proprioception impairment of lower limbs, patellar hypomobility. Patient will benefit from continued skilled PT to address above impairments and improve overall function and QoL.    REHAB POTENTIAL: Good   CLINICAL DECISION MAKING: Evolving/moderate  complexity   EVALUATION COMPLEXITY: Moderate     GOALS:   SHORT TERM GOALS: Target date: 10/31/2022   Pt will be independent with HEP to improve strength and decrease knee pain to improve pain-free function at home and work. Baseline: 10/08/22: Discussed stretching for home program, formal HEP to be given on visit # 2.  Goal status: INITIAL     LONG TERM GOALS: Target date: 11/21/2022   Pt will increase FOTO to at least 63 to demonstrate significant improvement in function at home and work related to knee pain  Baseline: 10/08/22: 45 Goal status: INITIAL   2.  Pt will decrease worst knee pain by at least 3 points on the NPRS in order to demonstrate clinically significant reduction in back pain. Baseline: 10/08/22: Knee pain 9/10 at worst Goal status: INITIAL   3.  Pt will increase strength of L hip and knee musculature measured at initial eval by at least 1/2 MMT grade in order to demonstrate improvement in strength and function as needed for improved ability to perform stair climbing and squatting Baseline: 10/08/22: Significant pain with repetitive stair climbing, squatting/bending down, and pivoting  Goal status: INITIAL   4.  Pt will tolerate standing activity up to 1 hour without exacerbation of knee pain as needed for completing community outings and errands in town Baseline: 10/08/22: Pt tolerate standing up to about 20-30 minutes per self-report  Goal status: INITIAL   5.  Pt will perform 3-way lunge without loss of frontal plane control and no LOB or knee buckling with no exacerbation of knee pain indicative of improved ability to complete multi-planar movements with weightbearing on LLE and improved tolerance of pivoting as needed for functional mobility Baseline:  10/08/22: Significant pain with pivoting and turning when weightbearing on LLE Goal status: INITIAL   PLAN: PT FREQUENCY: 2x/week   PT DURATION: 6 weeks   PLANNED INTERVENTIONS: Therapeutic exercises,  Therapeutic activity, Neuromuscular re-education, Balance training, Gait training, Patient/Family education, Electrical stimulation, Cryotherapy, Moist heat, Taping, and Manual therapy   PLAN FOR NEXT SESSION: STM/IASTM to improve soft tissue mobility and sensitivity along posterior thigh. Progress with quadriceps and hip abductor/extensor/flexor strengthening. Progress to closed-chain movements as tolerated as weightbearing activity tolerance improves.      Valentina Gu, PT, DPT [id number]  Eilleen Kempf, PT 10/19/2022, 3:19 PM

## 2022-10-19 ENCOUNTER — Encounter: Payer: Self-pay | Admitting: Physical Therapy

## 2022-10-22 ENCOUNTER — Ambulatory Visit: Payer: Medicare Other | Admitting: Physical Therapy

## 2022-10-22 ENCOUNTER — Encounter: Payer: Self-pay | Admitting: Physical Therapy

## 2022-10-22 DIAGNOSIS — M25562 Pain in left knee: Secondary | ICD-10-CM | POA: Diagnosis not present

## 2022-10-22 DIAGNOSIS — R262 Difficulty in walking, not elsewhere classified: Secondary | ICD-10-CM

## 2022-10-22 DIAGNOSIS — G8929 Other chronic pain: Secondary | ICD-10-CM

## 2022-10-22 DIAGNOSIS — R2681 Unsteadiness on feet: Secondary | ICD-10-CM

## 2022-10-22 DIAGNOSIS — M6281 Muscle weakness (generalized): Secondary | ICD-10-CM

## 2022-10-22 NOTE — Therapy (Signed)
OUTPATIENT PHYSICAL THERAPY TREATMENT NOTE   Patient Name: William Lynn MRN: [id number] DOB:21-Apr-1961, 61 y.o., male Today's Date: 10/22/2022  PCP: [facility] REFERRING PROVIDER: Reesa Chew, MD  END OF SESSION:   PT End of Session - 10/22/22 0818     Visit Number 5    Number of Visits 13    Date for PT Re-Evaluation 11/19/22    Authorization Type eval 10/08/22    PT Start Time 0814    PT Stop Time 0858    PT Time Calculation (min) 44 min    Equipment Utilized During Treatment --   pt utlizies personal SPC for gait intermittently during episodes of heightened pain   Activity Tolerance Patient tolerated treatment well    Behavior During Therapy WFL for tasks assessed/performed                Past Medical History:  Diagnosis Date   GERD (gastroesophageal reflux disease)    Neuropathy    Sarcoidosis    Past Surgical History:  Procedure Laterality Date   HERNIA REPAIR     KNEE SURGERY Right    SHOULDER SURGERY Bilateral    Patient Active Problem List   Diagnosis Date Noted   TIA (transient ischemic attack) 10/13/2021   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   On corticosteroid therapy 06/21/2018   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   Gait abnormality 02/25/2018   Low serum thyroid stimulating hormone (TSH) 02/25/2018   Serum gammaglobulin increased 02/25/2018   Muscle weakness (generalized) 06/07/2014   Closed fracture of glenoid cavity and neck of scapula 06/03/2014   S/P arthroscopy of shoulder 06/03/2014   Stiffness of joint, not elsewhere classified,  shoulder region 06/03/2014    REFERRING DIAG:  M25.562 (ICD-10-CM) - Pain in left knee  G89.29 (ICD-10-CM) - Other chronic pain  R29.898 (ICD-10-CM) - Other symptoms and signs involving the musculoskeletal system  M71.22 (ICD-10-CM) - Synovial cyst of popliteal space (Baker), left knee  M76.892 (ICD-10-CM) - Other specified enthesopathies of left lower limb, excluding foot    THERAPY  DIAG:  Chronic pain of left knee  Difficulty in walking, not elsewhere classified  Muscle weakness (generalized)  Unsteadiness on feet  Rationale for Evaluation and Treatment Rehabilitation  PERTINENT HISTORY: Patient is a 61 year old male referred for L knee pain with Hx of R knee surgery and hx of TIA associated with hypertension. Patient has Baker's cyst L knee. Hx of L ACL tear with reconstruction about 30 years ago.  Patient reports having blood clot in R calf confirmed with Doppler (superficial venous thrombosis in greater saphenous vein). Pt is using compression stockings at this time.  Pt has pain in posterior knee/popliteal fossa. Patient reports having "a little back pain." Patient is unsure of back pain happens with posterior knee pain. Patient reports disturbed sleep from leg cramps. Patient reports atraumatic onset of pain.    Pain:  Pain Intensity: Present: 0/10, Best: 0/10, Worst: 9/10 Pain location: Posterior knee/popliteal fossa Pain quality: previously sharp, feeling of "puffiness" in posterior knee/popliteal region  Radiating pain: No  Swelling: Yes , posterior knee  Popping, catching, locking: Yes , catching with leg rotating in closed-chain position  Numbness/Tingling: Yes, hx of long-standing numbness/tingling in toes from peripheral neuropathy  Focal weakness or buckling: No Aggravating factors: twisting leg in closed-chain flexion, standing, turning/pivoting, repetitive stair climbing or prolonged walking around house, squatting/bending down  Relieving factors: sitting, non-weightbearing 24-hour pain behavior: continuous pain throughout day  How long can you stand: 20-30 minutes  History of prior back, hip, or knee injury, pain, surgery, or therapy: Yes, hx of L ACL reconstruction 30 years, hx of back pain  Falls: Has patient fallen in last 6 months? No, Number of falls: N/A Follow-up appointment with MD: F/u with MD after PT Imaging: Yes , Ultrasound R LE    IMPRESSION 1. No evidence of deep venous thrombosis in the RIGHT lower extremity. 2. Superficial venous thrombosis of the greater saphenous vein.   Prior level of function: Independent Occupational demands: Retired Presenter, broadcastingHobbies: McGraw-HillPlay video games, difficulty with yard work/cutting grass c push mower Red flags (bowel/bladder changes, saddle paresthesia, personal history of cancer, h/o spinal tumors, h/o compression fx, h/o abdominal aneurysm, abdominal pain, chills/fever, night sweats, nausea, vomiting, unrelenting pain): Negative   Precautions: Superficial venous thrombosis of the greater saphenous vein in RLE   Weight Bearing Restrictions: No   Living Environment Lives with: lives with their spouse, nephew and his brother lives close by Lives in: House/apartment, stairs to get in home     Patient Goals: Resolved pain and "puffiness" in knee      PRECAUTIONS: Superficial venous thrombosis of the greater saphenous vein in RLE, Hx of transverse myelitis   OBJECTIVE: (objective measures completed at initial evaluation unless otherwise dated)  Patient Surveys  FOTO 45, predicted score of 5063     Cognition Patient is oriented to person, place, and time.  Recent memory is intact.  Remote memory is intact.  Attention span and concentration are intact.  Expressive speech is intact.  Patient's fund of knowledge is within normal limits for educational level.                            Gross Musculoskeletal Assessment Tremor: None Bulk: Normal Tone: Normal Pt presents with significant L knee genu valgum, L ankle pronation      GAIT: Distance walked: 50 ft Assistive device utilized: Single point cane Level of assistance: SBA Comments: Mild dynamic valgus LLE, overpronation      AROM       AROM (Normal range in degrees) AROM  10/08/2022  Lumbar    Flexion (65) WNL  Extension (30) WNL (pain in back)  Right lateral flexion (25) WNL  Left lateral flexion (25) WNL  Right  rotation (30)    Left rotation (30)                  Knee      Flexion (135) WNL 125  Extension (0) +10 +8         Ankle      Dorsiflexion (20)  WNL WNL  Plantarflexion (50)      Inversion (35)      Eversion (15      (* = pain; Blank rows = not tested)     LE MMT:   MMT (out of 5) Right 10/08/2022 Left 10/08/2022  Hip flexion 5 4  Hip extension 4+ 4- (cramp)  Hip abduction 4+ 4+  Hip adduction      Hip internal rotation      Hip external rotation      Knee flexion 5 5  Knee extension 5 4+  Ankle dorsiflexion      Ankle plantarflexion      Ankle inversion      Ankle eversion      (* = pain; Blank rows = not tested)     Sensation Mild sensory loss in great toes to  light touch bilateral LEs as determined by testing dermatomes L2-S2. Proprioception, and hot/cold testing deferred on this date.       Muscle Length Hamstrings: R: Not examined L: 10 deg lacking in 90/90 Quadriceps Michela Pitcher): R: Positive L: Positive Hip flexors Maisie Fus): R: Not done L: Not done IT band Claiborne Rigg): R: Not done L: Not done Gastrocnemius: R: Normal  L: Normal  (10/09/22)   Palpation   Location LEFT  RIGHT           Quadriceps 0 0  Medial Hamstrings 0 0  Lateral Hamstrings 0 0  Lateral Hamstring tendon 0 0  Medial Hamstring tendon 0 0  Quadriceps tendon      Patella 0 0  Patellar Tendon      Tibial Tuberosity      Medial joint line 0 0  Lateral joint line 0 0  MCL 0 0  LCL 0 0  Adductor Tubercle      Pes Anserine tendon      Infrapatellar fat pad      Fibular head   0  Popliteal fossa   1 (mild)  (Blank rows = not tested) Graded on 0-4 scale (0 = no pain, 1 = pain, 2 = pain with wincing/grimacing/flinching, 3 = pain with withdrawal, 4 = unwilling to allow palpation), (Blank rows = not tested)     Passive Accessory Motion   Patellofemoral: Superior Glide: R: Positive for moderate restriction L: Positive for moderate restriction Inferior Glide: R: Positive for moderate  restriction L: Positive for moderate restriction Medial Glide: R: Negative L: Negative Lateral Glide: R: Negative L: Negative        SPECIAL TESTS   Lumbar Radiculopathy SLR: R: Negative, L: Negative SLUMP: R: Negative, : Negative Repeated flexion: mild discomfort in back during, no reproduction of concordant popliteal fossa pain    Meniscus Tests McMurray's Test:  Medial Meniscus (Tibial ER): R: Negative L: Negative Lateral Meniscus (Tibial IR): R: Negative L: Negative (-) meniscal signs per clinical prediction rule       FUNCTIONAL TASKS   Pt able to readily perform sit to stand with no upper extremity support         TODAY'S TREATMENT   SUBJECTIVE: Pt reports anterior knee pain and concern with L knee popping and wanting to give way on him. Patient reports sensation of his knee feeling that it might "pop out of joint." Patient reports   PAIN:  Are you having pain? 5/10 Pain location: L anterior knee/joint line       Manual Therapy - for symptom modulation, soft tissue sensitivity and mobility, joint mobility, ROM   Patellar mobilization; gr III with emphasis on superior/inferior gliding; 3x30 sec bouts each direction  STM/IASTM L hamstrings and gastrocnemius x 10 minutes   Therapeutic Exercise - for improved soft tissue flexibility and extensibility as needed for ROM, improved strength as needed to improve performance of CKC activities/functional movements  NuStep, x 5 minutes; Level 3, adjusted to Level 2; for soft tissue extensibility/mobility  Straight leg raise; 2x8 with 2-lb ankle weight - verbal cueing for maintaining quad contraction and eccentric control Sidelying hip abduction; 1x10, 1x8 Glute bridge; 2x10   *not today* Seated hamstring stretch; 2x30sec - for review Prone Hip extension 2x10 alternating  [employer] double-limb squat, Level 22; depth to tolerance; 2x10 Standing calf stretch, lunge with upper limb support on treadmill; 2x30  sec  -focused on LLE in back for L calf flexibility, pt has onset of L  knee pain with LLE in front    Cold pack (unbilled) - for anti-inflammatory and analgesic effect as needed for reduced pain and improved ability to participate in active PT intervention, along L knee in supine, x 5 minutes   PATIENT EDUCATION:  Education details: Exercise technique as needed for carryover to HEP Person educated: Patient Education method: Explanation Education comprehension: verbalized understanding     HOME EXERCISE PROGRAM: Access Code: [email or web address] URL: [email or web address] Date: 10/15/2022 Prepared by: Consuela Mimes  Exercises - Seated Hamstring Stretch  - 2 x daily - 7 x weekly - 2 sets - 30sec hold - Gastroc Stretch on Wall  - 2 x daily - 7 x weekly - 3 sets - 30sec hold - Supine Active Straight Leg Raise  - 2 x daily - 7 x weekly - 2 sets - 10 reps     ASSESSMENT:   CLINICAL IMPRESSION: Reduced load and volume of exercise today and utilized ice pack to reduce L knee infrapatellar swelling. Patient has participated well with PT to date and has done well with exercises prescribed until last visit. He reports anterior knee pain and difficulty with weightbearing in the afternoon following last visit and arrives with [facility] to offload affected knee today. Pt is able to complete session today with modifications without notable pain acutely - only mm fatigue as expected. Will continue to monitor pt response with future sessions and modify program prn. Pt has remaining deficits in L hip flexor/gluteal and quad strength, mild hamstring flexibility deficit, medial hamstring and calf sensitivity on L, sensory/proprioception impairment of lower limbs, patellar hypomobility. Patient will benefit from continued skilled PT to address above impairments and improve overall function and QoL.    REHAB POTENTIAL: Good   CLINICAL DECISION MAKING: Evolving/moderate complexity   EVALUATION COMPLEXITY:  Moderate     GOALS:   SHORT TERM GOALS: Target date: 10/31/2022   Pt will be independent with HEP to improve strength and decrease knee pain to improve pain-free function at home and work. Baseline: 10/08/22: Discussed stretching for home program, formal HEP to be given on visit # 2.  Goal status: INITIAL     LONG TERM GOALS: Target date: 11/21/2022   Pt will increase FOTO to at least 63 to demonstrate significant improvement in function at home and work related to knee pain  Baseline: 10/08/22: 45 Goal status: INITIAL   2.  Pt will decrease worst knee pain by at least 3 points on the NPRS in order to demonstrate clinically significant reduction in back pain. Baseline: 10/08/22: Knee pain 9/10 at worst Goal status: INITIAL   3.  Pt will increase strength of L hip and knee musculature measured at initial eval by at least 1/2 MMT grade in order to demonstrate improvement in strength and function as needed for improved ability to perform stair climbing and squatting Baseline: 10/08/22: Significant pain with repetitive stair climbing, squatting/bending down, and pivoting  Goal status: INITIAL   4.  Pt will tolerate standing activity up to 1 hour without exacerbation of knee pain as needed for completing community outings and errands in town Baseline: 10/08/22: Pt tolerate standing up to about 20-30 minutes per self-report  Goal status: INITIAL   5.  Pt will perform 3-way lunge without loss of frontal plane control and no LOB or knee buckling with no exacerbation of knee pain indicative of improved ability to complete multi-planar movements with weightbearing on LLE and improved tolerance of pivoting as needed for functional mobility Baseline: 10/08/22:  Significant pain with pivoting and turning when weightbearing on LLE Goal status: INITIAL   PLAN: PT FREQUENCY: 2x/week   PT DURATION: 6 weeks   PLANNED INTERVENTIONS: Therapeutic exercises, Therapeutic activity, Neuromuscular  re-education, Balance training, Gait training, Patient/Family education, Electrical stimulation, Cryotherapy, Moist heat, Taping, and Manual therapy   PLAN FOR NEXT SESSION: STM/IASTM to improve soft tissue mobility and sensitivity along posterior thigh. Progress with quadriceps and hip abductor/extensor/flexor strengthening. Progress to closed-chain movements as tolerated as weightbearing activity tolerance improves.      Valentina Gu, PT, DPT #[id number]  Eilleen Kempf, PT 10/22/2022, 8:18 AM

## 2022-10-24 ENCOUNTER — Ambulatory Visit: Payer: Medicare Other | Admitting: Physical Therapy

## 2022-10-24 DIAGNOSIS — M6281 Muscle weakness (generalized): Secondary | ICD-10-CM

## 2022-10-24 DIAGNOSIS — M25562 Pain in left knee: Secondary | ICD-10-CM | POA: Diagnosis not present

## 2022-10-24 DIAGNOSIS — R2681 Unsteadiness on feet: Secondary | ICD-10-CM

## 2022-10-24 DIAGNOSIS — R262 Difficulty in walking, not elsewhere classified: Secondary | ICD-10-CM

## 2022-10-24 DIAGNOSIS — G8929 Other chronic pain: Secondary | ICD-10-CM

## 2022-10-24 NOTE — Therapy (Signed)
OUTPATIENT PHYSICAL THERAPY TREATMENT NOTE   Patient Name: William Lynn MRN: [id number] DOB:19-Apr-1961, 61 y.o., male Today's Date: 10/29/2022  PCP: [facility] REFERRING PROVIDER: Darrol Jump, MD  END OF SESSION:   PT End of Session - 10/29/22 0541     Visit Number 6    Number of Visits 13    Date for PT Re-Evaluation 11/19/22    Authorization Type eval 10/08/22    PT Start Time 0900    PT Stop Time 0944    PT Time Calculation (min) 44 min    Equipment Utilized During Treatment --   pt utlizies personal SPC for gait intermittently during episodes of heightened pain   Activity Tolerance Patient tolerated treatment well    Behavior During Therapy WFL for tasks assessed/performed                 Past Medical History:  Diagnosis Date   GERD (gastroesophageal reflux disease)    Neuropathy    Sarcoidosis    Past Surgical History:  Procedure Laterality Date   HERNIA REPAIR     KNEE SURGERY Right    SHOULDER SURGERY Bilateral    Patient Active Problem List   Diagnosis Date Noted   TIA (transient ischemic attack) 10/13/2021   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   On corticosteroid therapy 06/21/2018   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   Gait abnormality 02/25/2018   Low serum thyroid stimulating hormone (TSH) 02/25/2018   Serum gammaglobulin increased 02/25/2018   Muscle weakness (generalized) 06/07/2014   Closed fracture of glenoid cavity and neck of scapula 06/03/2014   S/P arthroscopy of shoulder 06/03/2014   Stiffness of joint, not elsewhere classified,  shoulder region 06/03/2014    REFERRING DIAG:  M25.562 (ICD-10-CM) - Pain in left knee  G89.29 (ICD-10-CM) - Other chronic pain  R29.898 (ICD-10-CM) - Other symptoms and signs involving the musculoskeletal system  M71.22 (ICD-10-CM) - Synovial cyst of popliteal space (Baker), left knee  M76.892 (ICD-10-CM) - Other specified enthesopathies of left lower limb, excluding foot     THERAPY DIAG:  Chronic pain of left knee  Difficulty in walking, not elsewhere classified  Muscle weakness (generalized)  Unsteadiness on feet  Rationale for Evaluation and Treatment Rehabilitation  PERTINENT HISTORY: Patient is a 61 year old male referred for L knee pain with Hx of R knee surgery and hx of TIA associated with hypertension. Patient has Baker's cyst L knee. Hx of L ACL tear with reconstruction about 30 years ago.  Patient reports having blood clot in R calf confirmed with Doppler (superficial venous thrombosis in greater saphenous vein). Pt is using compression stockings at this time.  Pt has pain in posterior knee/popliteal fossa. Patient reports having "a little back pain." Patient is unsure of back pain happens with posterior knee pain. Patient reports disturbed sleep from leg cramps. Patient reports atraumatic onset of pain.    Pain:  Pain Intensity: Present: 0/10, Best: 0/10, Worst: 9/10 Pain location: Posterior knee/popliteal fossa Pain quality: previously sharp, feeling of "puffiness" in posterior knee/popliteal region  Radiating pain: No  Swelling: Yes , posterior knee  Popping, catching, locking: Yes , catching with leg rotating in closed-chain position  Numbness/Tingling: Yes, hx of long-standing numbness/tingling in toes from peripheral neuropathy  Focal weakness or buckling: No Aggravating factors: twisting leg in closed-chain flexion, standing, turning/pivoting, repetitive stair climbing or prolonged walking around house, squatting/bending down  Relieving factors: sitting, non-weightbearing 24-hour pain behavior: continuous pain throughout day  How long can you stand: 20-30  minutes History of prior back, hip, or knee injury, pain, surgery, or therapy: Yes, hx of L ACL reconstruction 30 years, hx of back pain  Falls: Has patient fallen in last 6 months? No, Number of falls: N/A Follow-up appointment with MD: F/u with MD after PT Imaging: Yes ,  Ultrasound R LE   IMPRESSION 1. No evidence of deep venous thrombosis in the RIGHT lower extremity. 2. Superficial venous thrombosis of the greater saphenous vein.   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[employer], difficulty with yard work/cutting grass c push mower Red flags (bowel/bladder changes, saddle paresthesia, personal history of cancer, h/o spinal tumors, h/o compression fx, h/o abdominal aneurysm, abdominal pain, chills/fever, night sweats, nausea, vomiting, unrelenting pain): Negative   Precautions: Superficial venous thrombosis of the greater saphenous vein in RLE   Weight Bearing Restrictions: No   Living Environment Lives with: lives with their spouse, nephew and his brother lives close by Lives in: House/apartment, stairs to get in home     Patient Goals: Resolved pain and "puffiness" in knee      PRECAUTIONS: Superficial venous thrombosis of the greater saphenous vein in RLE, Hx of transverse myelitis   OBJECTIVE: (objective measures completed at initial evaluation unless otherwise dated)  Patient Surveys  FOTO 45, predicted score of 24     Cognition Patient is oriented to person, place, and time.  Recent memory is intact.  Remote memory is intact.  Attention span and concentration are intact.  Expressive speech is intact.  Patient's fund of knowledge is within normal limits for educational level.                            Gross Musculoskeletal Assessment Tremor: None Bulk: Normal Tone: Normal Pt presents with significant L knee genu valgum, L ankle pronation      GAIT: Distance walked: 50 ft Assistive device utilized: Single point cane Level of assistance: SBA Comments: Mild dynamic valgus LLE, overpronation      AROM       AROM (Normal range in degrees) AROM  10/08/2022  Lumbar    Flexion (65) WNL  Extension (30) WNL (pain in back)  Right lateral flexion (25) WNL  Left lateral flexion  (25) WNL  Right rotation (30)    Left rotation (30)                  Knee      Flexion (135) WNL 125  Extension (0) +10 +8         Ankle      Dorsiflexion (20)  WNL WNL  Plantarflexion (50)      Inversion (35)      Eversion (15      (* = pain; Blank rows = not tested)     LE MMT:   MMT (out of 5) Right 10/08/2022 Left 10/08/2022  Hip flexion 5 4  Hip extension 4+ 4- (cramp)  Hip abduction 4+ 4+  Hip adduction      Hip internal rotation      Hip external rotation      Knee flexion 5 5  Knee extension 5 4+  Ankle dorsiflexion      Ankle plantarflexion      Ankle inversion      Ankle eversion      (* = pain; Blank rows = not tested)     Sensation Mild sensory loss in great toes  to light touch bilateral LEs as determined by testing dermatomes L2-S2. Proprioception, and hot/cold testing deferred on this date.       Muscle Length Hamstrings: R: Not examined L: 10 deg lacking in 90/90 Quadriceps Pat Patrick): R: Positive L: Positive Hip flexors Marcello Moores): R: Not done L: Not done IT band Nicoletta Dress): R: Not done L: Not done Gastrocnemius: R: Normal  L: Normal  (10/09/22)   Palpation   Location LEFT  RIGHT           Quadriceps 0 0  Medial Hamstrings 0 0  Lateral Hamstrings 0 0  Lateral Hamstring tendon 0 0  Medial Hamstring tendon 0 0  Quadriceps tendon      Patella 0 0  Patellar Tendon      Tibial Tuberosity      Medial joint line 0 0  Lateral joint line 0 0  MCL 0 0  LCL 0 0  Adductor Tubercle      Pes Anserine tendon      Infrapatellar fat pad      Fibular head   0  Popliteal fossa   1 (mild)  (Blank rows = not tested) Graded on 0-4 scale (0 = no pain, 1 = pain, 2 = pain with wincing/grimacing/flinching, 3 = pain with withdrawal, 4 = unwilling to allow palpation), (Blank rows = not tested)     Passive Accessory Motion   Patellofemoral: Superior Glide: R: Positive for moderate restriction L: Positive for moderate restriction Inferior Glide: R: Positive for  moderate restriction L: Positive for moderate restriction Medial Glide: R: Negative L: Negative Lateral Glide: R: Negative L: Negative        SPECIAL TESTS   Lumbar Radiculopathy SLR: R: Negative, L: Negative SLUMP: R: Negative, : Negative Repeated flexion: mild discomfort in back during, no reproduction of concordant popliteal fossa pain    Meniscus Tests McMurray's Test:  Medial Meniscus (Tibial ER): R: Negative L: Negative Lateral Meniscus (Tibial IR): R: Negative L: Negative (-) meniscal signs per clinical prediction rule       FUNCTIONAL TASKS   Pt able to readily perform sit to stand with no upper extremity support         TODAY'S TREATMENT   SUBJECTIVE: Pt reports tolerating his last visit better with modifications to exercise. Pt does not have notable complaint of posterior knee pain at this time. He reports infrapatellar/anterior knee pain, intermittent L knee popping.  PAIN:  Are you having pain? 5/10 Pain location: L anterior knee/joint line       Manual Therapy - for symptom modulation, soft tissue sensitivity and mobility, joint mobility, ROM   L Patellar mobilization; gr III with emphasis on superior/inferior gliding; 3x30 sec bouts each direction  L Tibiofemoral joint mobilization gr I-II A-P, for pain control 2x30 sec bouts   IASTM with Theraband roller along L hamstrings and gastrocnemius; x 5 minutes   Therapeutic Exercise - for improved soft tissue flexibility and extensibility as needed for ROM, improved strength as needed to improve performance of CKC activities/functional movements  NuStep, x 5 minutes; Level 3, adjusted to Level 2; for soft tissue extensibility/mobility  -subjective information gathered during this time Dynamic hamstrings stretch (sciatic nerve floss technique); 2x10 Straight leg raise; 2x10 with 2-lb ankle weight - verbal cueing for maintaining quad contraction and eccentric control Sidelying hip abduction; 1x10, 1x8 Glute  bridge; 2x10. Blue Tband above knees  Minisquat 0-30 deg; 2x8, therapist guarding posteriorly     *not today* Seated knee extension;  7.5 lbs 1x10, 10 lbs 1x10 Seated hamstring stretch; 2x30sec - for review Prone Hip extension 2x10 alternating  [employer] double-limb squat, Level 22; depth to tolerance; 2x10 Standing calf stretch, lunge with upper limb support on treadmill; 2x30 sec  -focused on LLE in back for L calf flexibility, pt has onset of L knee pain with LLE in front    Cold pack (unbilled) - for anti-inflammatory and analgesic effect as needed for reduced pain and improved ability to participate in active PT intervention, along L knee in supine, x 5 minutes   PATIENT EDUCATION:  Education details: Exercise technique as needed for carryover to HEP Person educated: Patient Education method: Explanation Education comprehension: verbalized understanding     HOME EXERCISE PROGRAM: Access Code: [email or web address] URL: [email or web address] Date: 10/15/2022 Prepared by: Valentina Gu  Exercises - Seated Hamstring Stretch  - 2 x daily - 7 x weekly - 2 sets - 30sec hold - Gastroc Stretch on Wall  - 2 x daily - 7 x weekly - 3 sets - 30sec hold - Supine Active Straight Leg Raise  - 2 x daily - 7 x weekly - 2 sets - 10 reps     ASSESSMENT:   CLINICAL IMPRESSION: Patient has improved tolerance of exercises over last 2 visits; we have concluded with cold pack to limit joint edema and for analgesic effect. Pt's primary complaint of pain along popliteal fossa region seems to be minimal at this time, but he has experienced anterior knee pain and concern for LLE giving way. Pt does have notable quadriceps strength deficit and impaired motor control of LLE stemming from history of transverse myelitis. Pt has remaining deficits in L hip flexor/gluteal and quad strength, mild hamstring flexibility deficit, medial hamstring and calf sensitivity on L, sensory/proprioception impairment  of lower limbs, patellar hypomobility. Patient will benefit from continued skilled PT to address above impairments and improve overall function and QoL.    REHAB POTENTIAL: Good   CLINICAL DECISION MAKING: Evolving/moderate complexity   EVALUATION COMPLEXITY: Moderate     GOALS:   SHORT TERM GOALS: Target date: 10/31/2022   Pt will be independent with HEP to improve strength and decrease knee pain to improve pain-free function at home and work. Baseline: 10/08/22: Discussed stretching for home program, formal HEP to be given on visit # 2.  Goal status: INITIAL     LONG TERM GOALS: Target date: 11/21/2022   Pt will increase FOTO to at least 63 to demonstrate significant improvement in function at home and work related to knee pain  Baseline: 10/08/22: 45 Goal status: INITIAL   2.  Pt will decrease worst knee pain by at least 3 points on the NPRS in order to demonstrate clinically significant reduction in back pain. Baseline: 10/08/22: Knee pain 9/10 at worst Goal status: INITIAL   3.  Pt will increase strength of L hip and knee musculature measured at initial eval by at least 1/2 MMT grade in order to demonstrate improvement in strength and function as needed for improved ability to perform stair climbing and squatting Baseline: 10/08/22: Significant pain with repetitive stair climbing, squatting/bending down, and pivoting  Goal status: INITIAL   4.  Pt will tolerate standing activity up to 1 hour without exacerbation of knee pain as needed for completing community outings and errands in town Baseline: 10/08/22: Pt tolerate standing up to about 20-30 minutes per self-report  Goal status: INITIAL   5.  Pt will perform 3-way lunge without loss of frontal plane  control and no LOB or knee buckling with no exacerbation of knee pain indicative of improved ability to complete multi-planar movements with weightbearing on LLE and improved tolerance of pivoting as needed for functional  mobility Baseline: 10/08/22: Significant pain with pivoting and turning when weightbearing on LLE Goal status: INITIAL   PLAN: PT FREQUENCY: 2x/week   PT DURATION: 6 weeks   PLANNED INTERVENTIONS: Therapeutic exercises, Therapeutic activity, Neuromuscular re-education, Balance training, Gait training, Patient/Family education, Electrical stimulation, Cryotherapy, Moist heat, Taping, and Manual therapy   PLAN FOR NEXT SESSION: STM/IASTM to improve soft tissue mobility and sensitivity along posterior thigh. Progress with quadriceps and hip abductor/extensor/flexor strengthening. Progress to closed-chain movements as tolerated as weightbearing activity tolerance improves.      Valentina Gu, PT, DPT [id number]  Eilleen Kempf, PT 10/29/2022, 5:42 AM

## 2022-10-29 ENCOUNTER — Ambulatory Visit: Payer: Medicare Other | Admitting: Physical Therapy

## 2022-10-29 ENCOUNTER — Encounter: Payer: Self-pay | Admitting: Physical Therapy

## 2022-10-29 DIAGNOSIS — G8929 Other chronic pain: Secondary | ICD-10-CM

## 2022-10-29 DIAGNOSIS — R262 Difficulty in walking, not elsewhere classified: Secondary | ICD-10-CM

## 2022-10-29 DIAGNOSIS — M25562 Pain in left knee: Secondary | ICD-10-CM | POA: Diagnosis not present

## 2022-10-29 DIAGNOSIS — M6281 Muscle weakness (generalized): Secondary | ICD-10-CM

## 2022-10-29 NOTE — Therapy (Signed)
OUTPATIENT PHYSICAL THERAPY TREATMENT NOTE   Patient Name: William CleverlyCedric Purifoy MRN: [id number] DOB:1961-03-23, 61 y.o., male Today's Date: 10/29/2022  [facility] REFERRING PROVIDER: Reesa ChewKathryne Bartolo, MD  END OF SESSION:   PT End of Session - 10/29/22 0832     Visit Number 7    Number of Visits 13    Date for PT Re-Evaluation 11/19/22    Authorization Type eval 10/08/22    PT Start Time 0827    PT Stop Time 0910    PT Time Calculation (min) 43 min    Equipment Utilized During Treatment --   pt utlizies personal SPC for gait intermittently during episodes of heightened pain   Activity Tolerance Patient tolerated treatment well    Behavior During Therapy WFL for tasks assessed/performed                 Past Medical History:  Diagnosis Date   GERD (gastroesophageal reflux disease)    Neuropathy    Sarcoidosis    Past Surgical History:  Procedure Laterality Date   HERNIA REPAIR     KNEE SURGERY Right    SHOULDER SURGERY Bilateral    Patient Active Problem List   Diagnosis Date Noted   TIA (transient ischemic attack) 10/13/2021   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   On corticosteroid therapy 06/21/2018   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   Gait abnormality 02/25/2018   Low serum thyroid stimulating hormone (TSH) 02/25/2018   Serum gammaglobulin increased 02/25/2018   Muscle weakness (generalized) 06/07/2014   Closed fracture of glenoid cavity and neck of scapula 06/03/2014   S/P arthroscopy of shoulder 06/03/2014   Stiffness of joint, not elsewhere classified,  shoulder region 06/03/2014    REFERRING DIAG:  M25.562 (ICD-10-CM) - Pain in left knee  G89.29 (ICD-10-CM) - Other chronic pain  R29.898 (ICD-10-CM) - Other symptoms and signs involving the musculoskeletal system  M71.22 (ICD-10-CM) - Synovial cyst of popliteal space (Baker), left knee  M76.892 (ICD-10-CM) - Other specified enthesopathies of left lower limb, excluding foot     THERAPY DIAG:  Chronic pain of left knee  Difficulty in walking, not elsewhere classified  Muscle weakness (generalized)  Rationale for Evaluation and Treatment Rehabilitation  PERTINENT HISTORY: Patient is a 61 year old male referred for L knee pain with Hx of R knee surgery and hx of TIA associated with hypertension. Patient has Baker's cyst L knee. Hx of L ACL tear with reconstruction about 30 years ago.  Patient reports having blood clot in R calf confirmed with Doppler (superficial venous thrombosis in greater saphenous vein). Pt is using compression stockings at this time.  Pt has pain in posterior knee/popliteal fossa. Patient reports having "a little back pain." Patient is unsure of back pain happens with posterior knee pain. Patient reports disturbed sleep from leg cramps. Patient reports atraumatic onset of pain.    Pain:  Pain Intensity: Present: 0/10, Best: 0/10, Worst: 9/10 Pain location: Posterior knee/popliteal fossa Pain quality: previously sharp, feeling of "puffiness" in posterior knee/popliteal region  Radiating pain: No  Swelling: Yes , posterior knee  Popping, catching, locking: Yes , catching with leg rotating in closed-chain position  Numbness/Tingling: Yes, hx of long-standing numbness/tingling in toes from peripheral neuropathy  Focal weakness or buckling: No Aggravating factors: twisting leg in closed-chain flexion, standing, turning/pivoting, repetitive stair climbing or prolonged walking around house, squatting/bending down  Relieving factors: sitting, non-weightbearing 24-hour pain behavior: continuous pain throughout day  How long can you stand: 20-30 minutes History of prior  back, hip, or knee injury, pain, surgery, or therapy: Yes, hx of L ACL reconstruction 30 years, hx of back pain  Falls: Has patient fallen in last 6 months? No, Number of falls: N/A Follow-up appointment with MD: F/u with MD after PT Imaging: Yes , Ultrasound R LE    IMPRESSION 1. No evidence of deep venous thrombosis in the RIGHT lower extremity. 2. Superficial venous thrombosis of the greater saphenous vein.   Prior level of function: Independent Occupational demands: Retired Presenter, broadcasting: [employer], difficulty with yard work/cutting grass c push mower Red flags (bowel/bladder changes, saddle paresthesia, personal history of cancer, h/o spinal tumors, h/o compression fx, h/o abdominal aneurysm, abdominal pain, chills/fever, night sweats, nausea, vomiting, unrelenting pain): Negative   Precautions: Superficial venous thrombosis of the greater saphenous vein in RLE   Weight Bearing Restrictions: No   Living Environment Lives with: lives with their spouse, nephew and his brother lives close by Lives in: House/apartment, stairs to get in home     Patient Goals: Resolved pain and "puffiness" in knee      PRECAUTIONS: Superficial venous thrombosis of the greater saphenous vein in RLE, Hx of transverse myelitis   OBJECTIVE: (objective measures completed at initial evaluation unless otherwise dated)  Patient Surveys  FOTO 45, predicted score of 16     Cognition Patient is oriented to person, place, and time.  Recent memory is intact.  Remote memory is intact.  Attention span and concentration are intact.  Expressive speech is intact.  Patient's fund of knowledge is within normal limits for educational level.                            Gross Musculoskeletal Assessment Tremor: None Bulk: Normal Tone: Normal Pt presents with significant L knee genu valgum, L ankle pronation      GAIT: Distance walked: 50 ft Assistive device utilized: Single point cane Level of assistance: SBA Comments: Mild dynamic valgus LLE, overpronation      AROM       AROM (Normal range in degrees) AROM  10/08/2022  Lumbar    Flexion (65) WNL  Extension (30) WNL (pain in back)  Right lateral flexion (25) WNL  Left lateral flexion (25) WNL  Right  rotation (30)    Left rotation (30)                  Knee      Flexion (135) WNL 125  Extension (0) +10 +8         Ankle      Dorsiflexion (20)  WNL WNL  Plantarflexion (50)      Inversion (35)      Eversion (15      (* = pain; Blank rows = not tested)     LE MMT:   MMT (out of 5) Right 10/08/2022 Left 10/08/2022  Hip flexion 5 4  Hip extension 4+ 4- (cramp)  Hip abduction 4+ 4+  Hip adduction      Hip internal rotation      Hip external rotation      Knee flexion 5 5  Knee extension 5 4+  Ankle dorsiflexion      Ankle plantarflexion      Ankle inversion      Ankle eversion      (* = pain; Blank rows = not tested)     Sensation Mild sensory loss in great toes to light touch bilateral  LEs as determined by testing dermatomes L2-S2. Proprioception, and hot/cold testing deferred on this date.       Muscle Length Hamstrings: R: Not examined L: 10 deg lacking in 90/90 Quadriceps Pat Patrick): R: Positive L: Positive Hip flexors Marcello Moores): R: Not done L: Not done IT band Nicoletta Dress): R: Not done L: Not done Gastrocnemius: R: Normal  L: Normal  (10/09/22)   Palpation   Location LEFT  RIGHT           Quadriceps 0 0  Medial Hamstrings 0 0  Lateral Hamstrings 0 0  Lateral Hamstring tendon 0 0  Medial Hamstring tendon 0 0  Quadriceps tendon      Patella 0 0  Patellar Tendon      Tibial Tuberosity      Medial joint line 0 0  Lateral joint line 0 0  MCL 0 0  LCL 0 0  Adductor Tubercle      Pes Anserine tendon      Infrapatellar fat pad      Fibular head   0  Popliteal fossa   1 (mild)  (Blank rows = not tested) Graded on 0-4 scale (0 = no pain, 1 = pain, 2 = pain with wincing/grimacing/flinching, 3 = pain with withdrawal, 4 = unwilling to allow palpation), (Blank rows = not tested)     Passive Accessory Motion   Patellofemoral: Superior Glide: R: Positive for moderate restriction L: Positive for moderate restriction Inferior Glide: R: Positive for moderate  restriction L: Positive for moderate restriction Medial Glide: R: Negative L: Negative Lateral Glide: R: Negative L: Negative        SPECIAL TESTS   Lumbar Radiculopathy SLR: R: Negative, L: Negative SLUMP: R: Negative, : Negative Repeated flexion: mild discomfort in back during, no reproduction of concordant popliteal fossa pain    Meniscus Tests McMurray's Test:  Medial Meniscus (Tibial ER): R: Negative L: Negative Lateral Meniscus (Tibial IR): R: Negative L: Negative (-) meniscal signs per clinical prediction rule       FUNCTIONAL TASKS   Pt able to readily perform sit to stand with no upper extremity support         TODAY'S TREATMENT   SUBJECTIVE: Pt reports some discomfort along posterior knee this AM. He reports intermittent feeling of his knee wanting to pop. Pt reports compliance with his HEP.   PAIN:  Are you having pain? 2/10 pain this AM  Pain location: L posterior knee/popliteal region     Manual Therapy - for symptom modulation, soft tissue sensitivity and mobility, joint mobility, ROM   L Patellar mobilization; gr III with emphasis on superior/inferior gliding; 3x30 sec bouts each direction  L Tibiofemoral joint mobilization gr I-II A-P, for pain control 2x30 sec bouts   IASTM with Theraband roller along L hamstrings and gastrocnemius; x 6 minutes   Therapeutic Exercise - for improved soft tissue flexibility and extensibility as needed for ROM, improved strength as needed to improve performance of CKC activities/functional movements  NuStep, x 5 minutes; Level 3, for soft tissue extensibility/mobility  -subjective information gathered during this time intermittently, 2 minutes unbilled  -pt requests MHP along anterior thigh for c/o burning Dynamic hamstrings stretch (sciatic nerve floss technique); 2x10 Straight leg raise; 2x12 with 2-lb ankle weight - verbal cueing for maintaining quad contraction and eccentric control Sidelying hip abduction;  2x10;  Glute bridge; 2x10. Blue Tband above knees    *next visit* Minisquat 0-30 deg; 2x8, therapist guarding posteriorly Sit to stand with  band around distal thighs    *not today* Seated knee extension; 7.5 lbs 1x10, 10 lbs 1x10 Seated hamstring stretch; 2x30sec - for review Prone Hip extension 2x10 alternating  Total Gym double-limb squat, Level 22; depth to tolerance; 2x10 Standing calf stretch, lunge with upper limb support on treadmill; 2x30 sec  -focused on LLE in back for L calf flexibility, pt has onset of L knee pain with LLE in front Cold pack (unbilled) - for anti-inflammatory and analgesic effect as needed for reduced pain and improved ability to participate in active PT intervention, along L knee in supine, x 5 minutes     PATIENT EDUCATION:  Education details: Exercise technique as needed for carryover to HEP Person educated: Patient Education method: Explanation Education comprehension: verbalized understanding     HOME EXERCISE PROGRAM: Access Code: [email or web address] URL: [email or web address] Date: 10/15/2022 Prepared by: Consuela Mimes  Exercises - Seated Hamstring Stretch  - 2 x daily - 7 x weekly - 2 sets - 30sec hold - Gastroc Stretch on Wall  - 2 x daily - 7 x weekly - 3 sets - 30sec hold - Supine Active Straight Leg Raise  - 2 x daily - 7 x weekly - 2 sets - 10 reps     ASSESSMENT:   CLINICAL IMPRESSION: Patient has relatively low level of posterior knee pain at arrival. He reports intermittent feeling of L knee wanting to pop and give way during episode of popping. Pt has notable quadriceps strength deficit requiring further intervention. Pt is continuing with stretching program for hamstrings/calf tightness as well as use of ice/heat prn. Continued work on low-impact exercise today - will progress with low impact strengthening next visit with gradual re-introduction of closed-chain work in limited ROM. Pt has remaining deficits in L hip  flexor/gluteal and quad strength, mild hamstring flexibility deficit, medial hamstring and calf sensitivity on L, sensory/proprioception impairment of lower limbs, patellar hypomobility. Patient will benefit from continued skilled PT to address above impairments and improve overall function and QoL.    REHAB POTENTIAL: Good   CLINICAL DECISION MAKING: Evolving/moderate complexity   EVALUATION COMPLEXITY: Moderate     GOALS:   SHORT TERM GOALS: Target date: 10/31/2022   Pt will be independent with HEP to improve strength and decrease knee pain to improve pain-free function at home and work. Baseline: 10/08/22: Discussed stretching for home program, formal HEP to be given on visit # 2.  Goal status: INITIAL     LONG TERM GOALS: Target date: 11/21/2022   Pt will increase FOTO to at least 63 to demonstrate significant improvement in function at home and work related to knee pain  Baseline: 10/08/22: 45 Goal status: INITIAL   2.  Pt will decrease worst knee pain by at least 3 points on the NPRS in order to demonstrate clinically significant reduction in back pain. Baseline: 10/08/22: Knee pain 9/10 at worst Goal status: INITIAL   3.  Pt will increase strength of L hip and knee musculature measured at initial eval by at least 1/2 MMT grade in order to demonstrate improvement in strength and function as needed for improved ability to perform stair climbing and squatting Baseline: 10/08/22: Significant pain with repetitive stair climbing, squatting/bending down, and pivoting  Goal status: INITIAL   4.  Pt will tolerate standing activity up to 1 hour without exacerbation of knee pain as needed for completing community outings and errands in town Baseline: 10/08/22: Pt tolerate standing up to about 20-30 minutes per self-report  Goal  status: INITIAL   5.  Pt will perform 3-way lunge without loss of frontal plane control and no LOB or knee buckling with no exacerbation of knee pain indicative  of improved ability to complete multi-planar movements with weightbearing on LLE and improved tolerance of pivoting as needed for functional mobility Baseline: 10/08/22: Significant pain with pivoting and turning when weightbearing on LLE Goal status: INITIAL   PLAN: PT FREQUENCY: 2x/week   PT DURATION: 6 weeks   PLANNED INTERVENTIONS: Therapeutic exercises, Therapeutic activity, Neuromuscular re-education, Balance training, Gait training, Patient/Family education, Electrical stimulation, Cryotherapy, Moist heat, Taping, and Manual therapy   PLAN FOR NEXT SESSION: STM/IASTM to improve soft tissue mobility and sensitivity along posterior thigh. Progress with quadriceps and hip abductor/extensor/flexor strengthening. Progress to closed-chain movements as tolerated as weightbearing activity tolerance improves.      Consuela Mimes, PT, DPT #[id number]  Gertie Exon, PT 10/29/2022, 12:43 PM

## 2022-10-31 ENCOUNTER — Encounter: Payer: Self-pay | Admitting: Physical Therapy

## 2022-10-31 ENCOUNTER — Ambulatory Visit: Payer: Medicare Other | Attending: Internal Medicine | Admitting: Physical Therapy

## 2022-10-31 DIAGNOSIS — G8929 Other chronic pain: Secondary | ICD-10-CM | POA: Diagnosis present

## 2022-10-31 DIAGNOSIS — R262 Difficulty in walking, not elsewhere classified: Secondary | ICD-10-CM | POA: Diagnosis present

## 2022-10-31 DIAGNOSIS — R2681 Unsteadiness on feet: Secondary | ICD-10-CM | POA: Diagnosis present

## 2022-10-31 DIAGNOSIS — M25562 Pain in left knee: Secondary | ICD-10-CM | POA: Insufficient documentation

## 2022-10-31 DIAGNOSIS — M6281 Muscle weakness (generalized): Secondary | ICD-10-CM | POA: Diagnosis present

## 2022-10-31 NOTE — Therapy (Signed)
OUTPATIENT PHYSICAL THERAPY TREATMENT NOTE   Patient Name: William Lynn MRN: [id number] DOB:[birth year], 61 y.o., male Today's Date: 10/31/2022  PCP: [facility] REFERRING PROVIDER: Reesa ChewKathryne Bartolo, MD  END OF SESSION:   PT End of Session - 10/31/22 0916     Visit Number 8    Number of Visits 13    Date for PT Re-Evaluation 11/19/22    Authorization Type eval 10/08/22    PT Start Time 0908    PT Stop Time 0950    PT Time Calculation (min) 42 min    Equipment Utilized During Treatment --   pt utlizies personal SPC for gait intermittently during episodes of heightened pain   Activity Tolerance Patient tolerated treatment well    Behavior During Therapy WFL for tasks assessed/performed                 Past Medical History:  Diagnosis Date   GERD (gastroesophageal reflux disease)    Neuropathy    Sarcoidosis    Past Surgical History:  Procedure Laterality Date   HERNIA REPAIR     KNEE SURGERY Right    SHOULDER SURGERY Bilateral    Patient Active Problem List   Diagnosis Date Noted   TIA (transient ischemic attack) 10/13/2021   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   On corticosteroid therapy 06/21/2018   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   Gait abnormality 02/25/2018   Low serum thyroid stimulating hormone (TSH) 02/25/2018   Serum gammaglobulin increased 02/25/2018   Muscle weakness (generalized) 06/07/2014   Closed fracture of glenoid cavity and neck of scapula 06/03/2014   S/P arthroscopy of shoulder 06/03/2014   Stiffness of joint, not elsewhere classified,  shoulder region 06/03/2014    REFERRING DIAG:  M25.562 (ICD-10-CM) - Pain in left knee  G89.29 (ICD-10-CM) - Other chronic pain  R29.898 (ICD-10-CM) - Other symptoms and signs involving the musculoskeletal system  M71.22 (ICD-10-CM) - Synovial cyst of popliteal space (Baker), left knee  M76.892 (ICD-10-CM) - Other specified enthesopathies of left lower limb, excluding foot     THERAPY DIAG:  Chronic pain of left knee  Difficulty in walking, not elsewhere classified  Muscle weakness (generalized)  Rationale for Evaluation and Treatment Rehabilitation  PERTINENT HISTORY: Patient is a 61 year old male referred for L knee pain with Hx of R knee surgery and hx of TIA associated with hypertension. Patient has Baker's cyst L knee. Hx of L ACL tear with reconstruction about 30 years ago.  Patient reports having blood clot in R calf confirmed with Doppler (superficial venous thrombosis in greater saphenous vein). Pt is using compression stockings at this time.  Pt has pain in posterior knee/popliteal fossa. Patient reports having "a little back pain." Patient is unsure of back pain happens with posterior knee pain. Patient reports disturbed sleep from leg cramps. Patient reports atraumatic onset of pain.    Pain:  Pain Intensity: Present: 0/10, Best: 0/10, Worst: 9/10 Pain location: Posterior knee/popliteal fossa Pain quality: previously sharp, feeling of "puffiness" in posterior knee/popliteal region  Radiating pain: No  Swelling: Yes , posterior knee  Popping, catching, locking: Yes , catching with leg rotating in closed-chain position  Numbness/Tingling: Yes, hx of long-standing numbness/tingling in toes from peripheral neuropathy  Focal weakness or buckling: No Aggravating factors: twisting leg in closed-chain flexion, standing, turning/pivoting, repetitive stair climbing or prolonged walking around house, squatting/bending down  Relieving factors: sitting, non-weightbearing 24-hour pain behavior: continuous pain throughout day  How long can you stand: 20-30 minutes History of prior  back, hip, or knee injury, pain, surgery, or therapy: Yes, hx of L ACL reconstruction 30 years, hx of back pain  Falls: Has patient fallen in last 6 months? No, Number of falls: N/A Follow-up appointment with MD: F/u with MD after PT Imaging: Yes , Ultrasound R LE    IMPRESSION 1. No evidence of deep venous thrombosis in the RIGHT lower extremity. 2. Superficial venous thrombosis of the greater saphenous vein.   Prior level of function: Independent Occupational demands: Retired Presenter, broadcasting: [employer], difficulty with yard work/cutting grass c push mower Red flags (bowel/bladder changes, saddle paresthesia, personal history of cancer, h/o spinal tumors, h/o compression fx, h/o abdominal aneurysm, abdominal pain, chills/fever, night sweats, nausea, vomiting, unrelenting pain): Negative   Precautions: Superficial venous thrombosis of the greater saphenous vein in RLE   Weight Bearing Restrictions: No   Living Environment Lives with: lives with their spouse, nephew and his brother lives close by Lives in: House/apartment, stairs to get in home     Patient Goals: Resolved pain and "puffiness" in knee      PRECAUTIONS: Superficial venous thrombosis of the greater saphenous vein in RLE, Hx of transverse myelitis   OBJECTIVE: (objective measures completed at initial evaluation unless otherwise dated)  Patient Surveys  FOTO 45, predicted score of 61     Cognition Patient is oriented to person, place, and time.  Recent memory is intact.  Remote memory is intact.  Attention span and concentration are intact.  Expressive speech is intact.  Patient's fund of knowledge is within normal limits for educational level.                            Gross Musculoskeletal Assessment Tremor: None Bulk: Normal Tone: Normal Pt presents with significant L knee genu valgum, L ankle pronation      GAIT: Distance walked: 50 ft Assistive device utilized: Single point cane Level of assistance: SBA Comments: Mild dynamic valgus LLE, overpronation      AROM       AROM (Normal range in degrees) AROM  10/08/2022  Lumbar    Flexion (65) WNL  Extension (30) WNL (pain in back)  Right lateral flexion (25) WNL  Left lateral flexion (25) WNL  Right  rotation (30)    Left rotation (30)                  Knee      Flexion (135) WNL 125  Extension (0) +10 +8         Ankle      Dorsiflexion (20)  WNL WNL  Plantarflexion (50)      Inversion (35)      Eversion (15      (* = pain; Blank rows = not tested)     LE MMT:   MMT (out of 5) Right 10/08/2022 Left 10/08/2022  Hip flexion 5 4  Hip extension 4+ 4- (cramp)  Hip abduction 4+ 4+  Hip adduction      Hip internal rotation      Hip external rotation      Knee flexion 5 5  Knee extension 5 4+  Ankle dorsiflexion      Ankle plantarflexion      Ankle inversion      Ankle eversion      (* = pain; Blank rows = not tested)     Sensation Mild sensory loss in great toes to light touch bilateral  LEs as determined by testing dermatomes L2-S2. Proprioception, and hot/cold testing deferred on this date.       Muscle Length Hamstrings: R: Not examined L: 10 deg lacking in 90/90 Quadriceps Michela Pitcher): R: Positive L: Positive Hip flexors Maisie Fus): R: Not done L: Not done IT band Claiborne Rigg): R: Not done L: Not done Gastrocnemius: R: Normal  L: Normal  (10/09/22)   Palpation   Location LEFT  RIGHT           Quadriceps 0 0  Medial Hamstrings 0 0  Lateral Hamstrings 0 0  Lateral Hamstring tendon 0 0  Medial Hamstring tendon 0 0  Quadriceps tendon      Patella 0 0  Patellar Tendon      Tibial Tuberosity      Medial joint line 0 0  Lateral joint line 0 0  MCL 0 0  LCL 0 0  Adductor Tubercle      Pes Anserine tendon      Infrapatellar fat pad      Fibular head   0  Popliteal fossa   1 (mild)  (Blank rows = not tested) Graded on 0-4 scale (0 = no pain, 1 = pain, 2 = pain with wincing/grimacing/flinching, 3 = pain with withdrawal, 4 = unwilling to allow palpation), (Blank rows = not tested)     Passive Accessory Motion   Patellofemoral: Superior Glide: R: Positive for moderate restriction L: Positive for moderate restriction Inferior Glide: R: Positive for moderate  restriction L: Positive for moderate restriction Medial Glide: R: Negative L: Negative Lateral Glide: R: Negative L: Negative        SPECIAL TESTS   Lumbar Radiculopathy SLR: R: Negative, L: Negative SLUMP: R: Negative, : Negative Repeated flexion: mild discomfort in back during, no reproduction of concordant popliteal fossa pain    Meniscus Tests McMurray's Test:  Medial Meniscus (Tibial ER): R: Negative L: Negative Lateral Meniscus (Tibial IR): R: Negative L: Negative (-) meniscal signs per clinical prediction rule       FUNCTIONAL TASKS   Pt able to readily perform sit to stand with no upper extremity support         TODAY'S TREATMENT   SUBJECTIVE: Pt reports some mild discomfort along posterior knee this AM. He reports making fair progress to date. He states his R leg feels better when leaving PT. Pt is compliant with HEP.   PAIN:  Are you having pain? 1/10 pain this AM  Pain location: L posterior knee/popliteal region     Manual Therapy - for symptom modulation, soft tissue sensitivity and mobility, joint mobility, ROM   L Patellar mobilization; gr III with emphasis on superior/inferior gliding; 2x30 sec bouts each direction  L Tibiofemoral joint mobilization gr I-II A-P, for pain control 2x30 sec bouts   IASTM with Theraband roller along L hamstrings and gastrocnemius; x 8 minutes   Therapeutic Exercise - for improved soft tissue flexibility and extensibility as needed for ROM, improved strength as needed to improve performance of CKC activities/functional movements  NuStep, x 5 minutes; Level 3, for soft tissue extensibility/mobility  -subjective information gathered during this time intermittently, 1 minute unbilled  -pt requests MHP along anterior thigh for c/o burning Dynamic hamstrings stretch (sciatic nerve floss technique); 2x10 Straight leg raise; 2x12 with 2-lb ankle weight - verbal cueing for maintaining quad contraction and eccentric  control Sidelying hip abduction; 2x10;  Total Gym double-limb squat, Level 22; depth to tolerance; 2x10   *next visit* Minisquat 0-30  deg; 2x8, therapist guarding posteriorly Sit to stand with band around distal thighs Glute bridge; 2x10. Blue Tband above knees    *not today* Seated knee extension; 7.5 lbs 1x10, 10 lbs 1x10 Seated hamstring stretch; 2x30sec - for review Prone Hip extension 2x10 alternating  Standing calf stretch, lunge with upper limb support on treadmill; 2x30 sec  -focused on LLE in back for L calf flexibility, pt has onset of L knee pain with LLE in front Cold pack (unbilled) - for anti-inflammatory and analgesic effect as needed for reduced pain and improved ability to participate in active PT intervention, along L knee in supine, x 5 minutes     PATIENT EDUCATION:  Education details: Exercise technique as needed for carryover to HEP Person educated: Patient Education method: Explanation Education comprehension: verbalized understanding     HOME EXERCISE PROGRAM: Access Code: [email or web address] URL: [email or web address] Date: 10/15/2022 Prepared by: Consuela Mimes  Exercises - Seated Hamstring Stretch  - 2 x daily - 7 x weekly - 2 sets - 30sec hold - Gastroc Stretch on Wall  - 2 x daily - 7 x weekly - 3 sets - 30sec hold - Supine Active Straight Leg Raise  - 2 x daily - 7 x weekly - 2 sets - 10 reps     ASSESSMENT:   CLINICAL IMPRESSION: Patient has relatively low level of posterior knee pain at arrival. He reports intermittent feeling of L knee wanting to pop and give way during episode of popping. Pt has notable quadriceps strength deficit requiring further intervention. Pt is continuing with stretching program for hamstrings/calf tightness as well as use of ice/heat prn. Continued work on low-impact exercise today - will progress with low impact strengthening next visit with gradual re-introduction of closed-chain work in limited ROM. Pt has  remaining deficits in L hip flexor/gluteal and quad strength, mild hamstring flexibility deficit, medial hamstring and calf sensitivity on L, sensory/proprioception impairment of lower limbs, patellar hypomobility. Patient will benefit from continued skilled PT to address above impairments and improve overall function and QoL.    REHAB POTENTIAL: Good   CLINICAL DECISION MAKING: Evolving/moderate complexity   EVALUATION COMPLEXITY: Moderate     GOALS:   SHORT TERM GOALS: Target date: 10/31/2022   Pt will be independent with HEP to improve strength and decrease knee pain to improve pain-free function at home and work. Baseline: 10/08/22: Discussed stretching for home program, formal HEP to be given on visit # 2.  Goal status: INITIAL     LONG TERM GOALS: Target date: 11/21/2022   Pt will increase FOTO to at least 63 to demonstrate significant improvement in function at home and work related to knee pain  Baseline: 10/08/22: 45 Goal status: INITIAL   2.  Pt will decrease worst knee pain by at least 3 points on the NPRS in order to demonstrate clinically significant reduction in back pain. Baseline: 10/08/22: Knee pain 9/10 at worst Goal status: INITIAL   3.  Pt will increase strength of L hip and knee musculature measured at initial eval by at least 1/2 MMT grade in order to demonstrate improvement in strength and function as needed for improved ability to perform stair climbing and squatting Baseline: 10/08/22: Significant pain with repetitive stair climbing, squatting/bending down, and pivoting  Goal status: INITIAL   4.  Pt will tolerate standing activity up to 1 hour without exacerbation of knee pain as needed for completing community outings and errands in town Baseline: 10/08/22: Pt tolerate standing up to about  20-30 minutes per self-report  Goal status: INITIAL   5.  Pt will perform 3-way lunge without loss of frontal plane control and no LOB or knee buckling with no  exacerbation of knee pain indicative of improved ability to complete multi-planar movements with weightbearing on LLE and improved tolerance of pivoting as needed for functional mobility Baseline: 10/08/22: Significant pain with pivoting and turning when weightbearing on LLE Goal status: INITIAL   PLAN: PT FREQUENCY: 2x/week   PT DURATION: 6 weeks   PLANNED INTERVENTIONS: Therapeutic exercises, Therapeutic activity, Neuromuscular re-education, Balance training, Gait training, Patient/Family education, Electrical stimulation, Cryotherapy, Moist heat, Taping, and Manual therapy   PLAN FOR NEXT SESSION: STM/IASTM to improve soft tissue mobility and sensitivity along posterior thigh. Progress with quadriceps and hip abductor/extensor/flexor strengthening. Progress to closed-chain movements as tolerated as weightbearing activity tolerance improves.     THIS NOTE IS INCOMPLETE, PLEASE DO NOT REFERENCE FOR INFORMATION  Valentina Gu, PT, DPT #[id number]  Eilleen Kempf, PT 10/31/2022, 9:16 AM

## 2022-11-05 ENCOUNTER — Encounter: Payer: Self-pay | Admitting: Physical Therapy

## 2022-11-05 ENCOUNTER — Ambulatory Visit: Payer: Medicare Other | Admitting: Physical Therapy

## 2022-11-05 DIAGNOSIS — G8929 Other chronic pain: Secondary | ICD-10-CM

## 2022-11-05 DIAGNOSIS — M25562 Pain in left knee: Secondary | ICD-10-CM | POA: Diagnosis not present

## 2022-11-05 DIAGNOSIS — R262 Difficulty in walking, not elsewhere classified: Secondary | ICD-10-CM

## 2022-11-05 DIAGNOSIS — M6281 Muscle weakness (generalized): Secondary | ICD-10-CM

## 2022-11-05 NOTE — Therapy (Unsigned)
OUTPATIENT PHYSICAL THERAPY TREATMENT NOTE   Patient Name: William Lynn MRN: [id number] DOB:04/02/1961, 61 y.o., male Today's Date: 11/05/2022  PCP: [facility] REFERRING PROVIDER: Reesa Chew, MD  END OF SESSION:   PT End of Session - 11/05/22 0827     Visit Number 9    Number of Visits 13    Date for PT Re-Evaluation 11/19/22    Authorization Type eval 10/08/22    PT Start Time 0820    PT Stop Time 0901    PT Time Calculation (min) 41 min    Equipment Utilized During Treatment --   pt utlizies personal SPC for gait intermittently during episodes of heightened pain   Activity Tolerance Patient tolerated treatment well    Behavior During Therapy WFL for tasks assessed/performed                 Past Medical History:  Diagnosis Date   GERD (gastroesophageal reflux disease)    Neuropathy    Sarcoidosis    Past Surgical History:  Procedure Laterality Date   HERNIA REPAIR     KNEE SURGERY Right    SHOULDER SURGERY Bilateral    Patient Active Problem List   Diagnosis Date Noted   TIA (transient ischemic attack) 10/13/2021   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   On corticosteroid therapy 06/21/2018   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   Gait abnormality 02/25/2018   Low serum thyroid stimulating hormone (TSH) 02/25/2018   Serum gammaglobulin increased 02/25/2018   Muscle weakness (generalized) 06/07/2014   Closed fracture of glenoid cavity and neck of scapula 06/03/2014   S/P arthroscopy of shoulder 06/03/2014   Stiffness of joint, not elsewhere classified,  shoulder region 06/03/2014    REFERRING DIAG:  M25.562 (ICD-10-CM) - Pain in left knee  G89.29 (ICD-10-CM) - Other chronic pain  R29.898 (ICD-10-CM) - Other symptoms and signs involving the musculoskeletal system  M71.22 (ICD-10-CM) - Synovial cyst of popliteal space (Baker), left knee  M76.892 (ICD-10-CM) - Other specified enthesopathies of left lower limb, excluding foot     THERAPY DIAG:  Chronic pain of left knee  Difficulty in walking, not elsewhere classified  Muscle weakness (generalized)  Rationale for Evaluation and Treatment Rehabilitation  PERTINENT HISTORY: Patient is a 61 year old male referred for L knee pain with Hx of R knee surgery and hx of TIA associated with hypertension. Patient has Baker's cyst L knee. Hx of L ACL tear with reconstruction about 30 years ago.  Patient reports having blood clot in R calf confirmed with Doppler (superficial venous thrombosis in greater saphenous vein). Pt is using compression stockings at this time.  Pt has pain in posterior knee/popliteal fossa. Patient reports having "a little back pain." Patient is unsure of back pain happens with posterior knee pain. Patient reports disturbed sleep from leg cramps. Patient reports atraumatic onset of pain.    Pain:  Pain Intensity: Present: 0/10, Best: 0/10, Worst: 9/10 Pain location: Posterior knee/popliteal fossa Pain quality: previously sharp, feeling of "puffiness" in posterior knee/popliteal region  Radiating pain: No  Swelling: Yes , posterior knee  Popping, catching, locking: Yes , catching with leg rotating in closed-chain position  Numbness/Tingling: Yes, hx of long-standing numbness/tingling in toes from peripheral neuropathy  Focal weakness or buckling: No Aggravating factors: twisting leg in closed-chain flexion, standing, turning/pivoting, repetitive stair climbing or prolonged walking around house, squatting/bending down  Relieving factors: sitting, non-weightbearing 24-hour pain behavior: continuous pain throughout day  How long can you stand: 20-30 minutes History of prior  back, hip, or knee injury, pain, surgery, or therapy: Yes, hx of L ACL reconstruction 30 years, hx of back pain  Falls: Has patient fallen in last 6 months? No, Number of falls: N/A Follow-up appointment with MD: F/u with MD after PT Imaging: Yes , Ultrasound R LE    IMPRESSION 1. No evidence of deep venous thrombosis in the RIGHT lower extremity. 2. Superficial venous thrombosis of the greater saphenous vein.   Prior level of function: Independent Occupational demands: Retired Presenter, broadcasting: [employer], difficulty with yard work/cutting grass c push mower Red flags (bowel/bladder changes, saddle paresthesia, personal history of cancer, h/o spinal tumors, h/o compression fx, h/o abdominal aneurysm, abdominal pain, chills/fever, night sweats, nausea, vomiting, unrelenting pain): Negative   Precautions: Superficial venous thrombosis of the greater saphenous vein in RLE   Weight Bearing Restrictions: No   Living Environment Lives with: lives with their spouse, nephew and his brother lives close by Lives in: House/apartment, stairs to get in home     Patient Goals: Resolved pain and "puffiness" in knee      PRECAUTIONS: Superficial venous thrombosis of the greater saphenous vein in RLE, Hx of transverse myelitis   OBJECTIVE: (objective measures completed at initial evaluation unless otherwise dated)  Patient Surveys  FOTO 45, predicted score of 61     Cognition Patient is oriented to person, place, and time.  Recent memory is intact.  Remote memory is intact.  Attention span and concentration are intact.  Expressive speech is intact.  Patient's fund of knowledge is within normal limits for educational level.                            Gross Musculoskeletal Assessment Tremor: None Bulk: Normal Tone: Normal Pt presents with significant L knee genu valgum, L ankle pronation      GAIT: Distance walked: 50 ft Assistive device utilized: Single point cane Level of assistance: SBA Comments: Mild dynamic valgus LLE, overpronation      AROM       AROM (Normal range in degrees) AROM  10/08/2022  Lumbar    Flexion (65) WNL  Extension (30) WNL (pain in back)  Right lateral flexion (25) WNL  Left lateral flexion (25) WNL  Right  rotation (30)    Left rotation (30)                  Knee      Flexion (135) WNL 125  Extension (0) +10 +8         Ankle      Dorsiflexion (20)  WNL WNL  Plantarflexion (50)      Inversion (35)      Eversion (15      (* = pain; Blank rows = not tested)     LE MMT:   MMT (out of 5) Right 10/08/2022 Left 10/08/2022  Hip flexion 5 4  Hip extension 4+ 4- (cramp)  Hip abduction 4+ 4+  Hip adduction      Hip internal rotation      Hip external rotation      Knee flexion 5 5  Knee extension 5 4+  Ankle dorsiflexion      Ankle plantarflexion      Ankle inversion      Ankle eversion      (* = pain; Blank rows = not tested)     Sensation Mild sensory loss in great toes to light touch bilateral  LEs as determined by testing dermatomes L2-S2. Proprioception, and hot/cold testing deferred on this date.       Muscle Length Hamstrings: R: Not examined L: 10 deg lacking in 90/90 Quadriceps Michela Pitcher): R: Positive L: Positive Hip flexors Maisie Fus): R: Not done L: Not done IT band Claiborne Rigg): R: Not done L: Not done Gastrocnemius: R: Normal  L: Normal  (10/09/22)   Palpation   Location LEFT  RIGHT           Quadriceps 0 0  Medial Hamstrings 0 0  Lateral Hamstrings 0 0  Lateral Hamstring tendon 0 0  Medial Hamstring tendon 0 0  Quadriceps tendon      Patella 0 0  Patellar Tendon      Tibial Tuberosity      Medial joint line 0 0  Lateral joint line 0 0  MCL 0 0  LCL 0 0  Adductor Tubercle      Pes Anserine tendon      Infrapatellar fat pad      Fibular head   0  Popliteal fossa   1 (mild)  (Blank rows = not tested) Graded on 0-4 scale (0 = no pain, 1 = pain, 2 = pain with wincing/grimacing/flinching, 3 = pain with withdrawal, 4 = unwilling to allow palpation), (Blank rows = not tested)     Passive Accessory Motion   Patellofemoral: Superior Glide: R: Positive for moderate restriction L: Positive for moderate restriction Inferior Glide: R: Positive for moderate  restriction L: Positive for moderate restriction Medial Glide: R: Negative L: Negative Lateral Glide: R: Negative L: Negative        SPECIAL TESTS   Lumbar Radiculopathy SLR: R: Negative, L: Negative SLUMP: R: Negative, : Negative Repeated flexion: mild discomfort in back during, no reproduction of concordant popliteal fossa pain    Meniscus Tests McMurray's Test:  Medial Meniscus (Tibial ER): R: Negative L: Negative Lateral Meniscus (Tibial IR): R: Negative L: Negative (-) meniscal signs per clinical prediction rule       FUNCTIONAL TASKS   Pt able to readily perform sit to stand with no upper extremity support         TODAY'S TREATMENT   SUBJECTIVE: Pt reports pain along popliteal region of his L knee at 2/10 this AM. Pt reports tolerating his last visit well. Patient reports no recent major incidents with lower extremities buckling or flare-ups.   PAIN:  Are you having pain? 2/10 pain this AM  Pain location: L posterior knee/popliteal region     Manual Therapy - for symptom modulation, soft tissue sensitivity and mobility, joint mobility, ROM   L Patellar mobilization; gr III with emphasis on superior/inferior gliding; 2x30 sec bouts each direction  L Tibiofemoral joint mobilization gr I-II A-P, for pain control 2x30 sec bouts   IASTM with Theraband roller along L hamstrings and gastrocnemius; x 8 minutes   Therapeutic Exercise - for improved soft tissue flexibility and extensibility as needed for ROM, improved strength as needed to improve performance of CKC activities/functional movements  NuStep, x 5 minutes; Level 3, for soft tissue extensibility/mobility  -subjective information gathered during this time intermittently, 1 minute unbilled  -pt requests MHP along anterior thigh for c/o burning Dynamic hamstrings stretch (sciatic nerve floss technique); 2x10 Straight leg raise; 2x8 with 3-lb ankle weight - verbal cueing for maintaining quad contraction and  eccentric control Sidelying hip abduction; 2x10, 1.5-b;  Total Gym double-limb squat, Level 22; depth to tolerance; 2x10 Minisquat 0-30 deg; 2x8,  therapist guarding posteriorly Sit to stand with band around distal thighs Glute bridge; 2x10. Blue Tband above knees    *not today* Seated knee extension; 7.5 lbs 1x10, 10 lbs 1x10 Seated hamstring stretch; 2x30sec - for review Prone Hip extension 2x10 alternating  Standing calf stretch, lunge with upper limb support on treadmill; 2x30 sec  -focused on LLE in back for L calf flexibility, pt has onset of L knee pain with LLE in front Cold pack (unbilled) - for anti-inflammatory and analgesic effect as needed for reduced pain and improved ability to participate in active PT intervention, along L knee in supine, x 5 minutes     PATIENT EDUCATION:  Education details: Exercise technique as needed for carryover to HEP Person educated: Patient Education method: Explanation Education comprehension: verbalized understanding     HOME EXERCISE PROGRAM: Access Code: [email or web address] URL: [email or web address] Date: 10/15/2022 Prepared by: Valentina Gu  Exercises - Seated Hamstring Stretch  - 2 x daily - 7 x weekly - 2 sets - 30sec hold - Gastroc Stretch on Wall  - 2 x daily - 7 x weekly - 3 sets - 30sec hold - Supine Active Straight Leg Raise  - 2 x daily - 7 x weekly - 2 sets - 10 reps     ASSESSMENT:   CLINICAL IMPRESSION: Patient is able to resume low volume of CKC loading without significant increase in pain; pt has relatively low NPRS at this time. Pt is limited from impaired motor control and weakness of lower extremities associated with transverse myelitis. Pt is still notably challenged with open-chain hip isotonics at this time and has remaining quadriceps weakness with difficulty controlling for extensor lag (pt able to perform SLR without lag, though exceptional effort is required) necessitating further intervention. Pt has  remaining deficits in L hip flexor/gluteal and quad strength, mild hamstring flexibility deficit, medial hamstring and calf sensitivity on L, sensory/proprioception impairment of lower limbs, patellar hypomobility. Patient will benefit from continued skilled PT to address above impairments and improve overall function and QoL.    REHAB POTENTIAL: Good   CLINICAL DECISION MAKING: Evolving/moderate complexity   EVALUATION COMPLEXITY: Moderate     GOALS:   SHORT TERM GOALS: Target date: 10/31/2022   Pt will be independent with HEP to improve strength and decrease knee pain to improve pain-free function at home and work. Baseline: 10/08/22: Discussed stretching for home program, formal HEP to be given on visit # 2.  Goal status: INITIAL     LONG TERM GOALS: Target date: 11/21/2022   Pt will increase FOTO to at least 63 to demonstrate significant improvement in function at home and work related to knee pain  Baseline: 10/08/22: 45 Goal status: INITIAL   2.  Pt will decrease worst knee pain by at least 3 points on the NPRS in order to demonstrate clinically significant reduction in back pain. Baseline: 10/08/22: Knee pain 9/10 at worst Goal status: INITIAL   3.  Pt will increase strength of L hip and knee musculature measured at initial eval by at least 1/2 MMT grade in order to demonstrate improvement in strength and function as needed for improved ability to perform stair climbing and squatting Baseline: 10/08/22: Significant pain with repetitive stair climbing, squatting/bending down, and pivoting  Goal status: INITIAL   4.  Pt will tolerate standing activity up to 1 hour without exacerbation of knee pain as needed for completing community outings and errands in town Baseline: 10/08/22: Pt tolerate standing up to about 20-30 minutes  per self-report  Goal status: INITIAL   5.  Pt will perform 3-way lunge without loss of frontal plane control and no LOB or knee buckling with no  exacerbation of knee pain indicative of improved ability to complete multi-planar movements with weightbearing on LLE and improved tolerance of pivoting as needed for functional mobility Baseline: 10/08/22: Significant pain with pivoting and turning when weightbearing on LLE Goal status: INITIAL    PLAN: PT FREQUENCY: 2x/week   PT DURATION: 6 weeks   PLANNED INTERVENTIONS: Therapeutic exercises, Therapeutic activity, Neuromuscular re-education, Balance training, Gait training, Patient/Family education, Electrical stimulation, Cryotherapy, Moist heat, Taping, and Manual therapy   PLAN FOR NEXT SESSION: STM/IASTM to improve soft tissue mobility and sensitivity along posterior thigh. Progress with quadriceps and hip abductor/extensor/flexor strengthening. Progress to closed-chain movements as tolerated as weightbearing activity tolerance improves.      Consuela Mimes, PT, DPT #[id number]  Gertie Exon, PT 11/05/2022, 8:27 AM

## 2022-11-07 ENCOUNTER — Ambulatory Visit: Payer: Medicare Other | Admitting: Physical Therapy

## 2022-11-07 DIAGNOSIS — M25562 Pain in left knee: Secondary | ICD-10-CM | POA: Diagnosis not present

## 2022-11-07 DIAGNOSIS — R262 Difficulty in walking, not elsewhere classified: Secondary | ICD-10-CM

## 2022-11-07 DIAGNOSIS — M6281 Muscle weakness (generalized): Secondary | ICD-10-CM

## 2022-11-07 DIAGNOSIS — G8929 Other chronic pain: Secondary | ICD-10-CM

## 2022-11-07 NOTE — Therapy (Signed)
OUTPATIENT PHYSICAL THERAPY TREATMENT AND PROGRESS NOTE   Dates of reporting period  10/08/22   to   11/07/22    Patient Name: William Lynn MRN: [id number] DOB:1961/03/08, 61 y.o., male Today's Date: 11/11/2022  PCP: [facility] REFERRING PROVIDER: Darrol Jump, MD  END OF SESSION:   PT End of Session - 11/11/22 0938     Visit Number 10    Number of Visits 13    Date for PT Re-Evaluation 01/05/23    Authorization Type eval 10/08/22    PT Start Time 0903    PT Stop Time 0951    PT Time Calculation (min) 48 min    Equipment Utilized During Treatment --   pt utlizies personal SPC for gait intermittently during episodes of heightened pain   Activity Tolerance Patient tolerated treatment well    Behavior During Therapy WFL for tasks assessed/performed               Past Medical History:  Diagnosis Date   GERD (gastroesophageal reflux disease)    Neuropathy    Sarcoidosis    Past Surgical History:  Procedure Laterality Date   HERNIA REPAIR     KNEE SURGERY Right    SHOULDER SURGERY Bilateral    Patient Active Problem List   Diagnosis Date Noted   TIA (transient ischemic attack) 10/13/2021   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   On corticosteroid therapy 06/21/2018   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   Gait abnormality 02/25/2018   Low serum thyroid stimulating hormone (TSH) 02/25/2018   Serum gammaglobulin increased 02/25/2018   Muscle weakness (generalized) 06/07/2014   Closed fracture of glenoid cavity and neck of scapula 06/03/2014   S/P arthroscopy of shoulder 06/03/2014   Stiffness of joint, not elsewhere classified,  shoulder region 06/03/2014    REFERRING DIAG:  M25.562 (ICD-10-CM) - Pain in left knee  G89.29 (ICD-10-CM) - Other chronic pain  R29.898 (ICD-10-CM) - Other symptoms and signs involving the musculoskeletal system  M71.22 (ICD-10-CM) - Synovial cyst of popliteal space ([place]aker), left knee  M76.892 (ICD-10-CM) - Other  specified enthesopathies of left lower limb, excluding foot    THERAPY DIAG:  Chronic pain of left knee  Difficulty in walking, not elsewhere classified  Muscle weakness (generalized)  Rationale for Evaluation and Treatment Rehabilitation  PERTINENT HISTORY: Patient is a 61 year old male referred for L knee pain with Hx of R knee surgery and hx of TIA associated with hypertension. Patient has Baker's cyst L knee. Hx of L ACL tear with reconstruction about 30 years ago.  Patient reports having blood clot in R calf confirmed with Doppler (superficial venous thrombosis in greater saphenous vein). Pt is using compression stockings at this time.  Pt has pain in posterior knee/popliteal fossa. Patient reports having "a little back pain." Patient is unsure of back pain happens with posterior knee pain. Patient reports disturbed sleep from leg cramps. Patient reports atraumatic onset of pain.    Pain:  Pain Intensity: Present: 0/10, Best: 0/10, Worst: 9/10 Pain location: Posterior knee/popliteal fossa Pain quality: previously sharp, feeling of "puffiness" in posterior knee/popliteal region  Radiating pain: No  Swelling: Yes , posterior knee  Popping, catching, locking: Yes , catching with leg rotating in closed-chain position  Numbness/Tingling: Yes, hx of long-standing numbness/tingling in toes from peripheral neuropathy  Focal weakness or buckling: No Aggravating factors: twisting leg in closed-chain flexion, standing, turning/pivoting, repetitive stair climbing or prolonged walking around house, squatting/bending down  Relieving factors: sitting, non-weightbearing 24-hour pain behavior:  continuous pain throughout day  How long can you stand: 20-30 minutes History of prior back, hip, or knee injury, pain, surgery, or therapy: Yes, hx of L ACL reconstruction 30 years, hx of back pain  Falls: Has patient fallen in last 6 months? No, Number of falls: N/A Follow-up appointment with MD: F/u with  MD after PT Imaging: Yes , Ultrasound R LE   IMPRESSION 1. No evidence of deep venous thrombosis in the RIGHT lower extremity. 2. Superficial venous thrombosis of the greater saphenous vein.   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[employer], difficulty with yard work/cutting grass c push mower Red flags (bowel/bladder changes, saddle paresthesia, personal history of cancer, h/o spinal tumors, h/o compression fx, h/o abdominal aneurysm, abdominal pain, chills/fever, night sweats, nausea, vomiting, unrelenting pain): Negative   Precautions: Superficial venous thrombosis of the greater saphenous vein in RLE   Weight Bearing Restrictions: No   Living Environment Lives with: lives with their spouse, nephew and his brother lives close by Lives in: House/apartment, stairs to get in home     Patient Goals: Resolved pain and "puffiness" in knee      PRECAUTIONS: Superficial venous thrombosis of the greater saphenous vein in RLE, Hx of transverse myelitis   OBJECTIVE: (objective measures completed at initial evaluation unless otherwise dated)  Patient Surveys  FOTO 45, predicted score of 8     Cognition Patient is oriented to person, place, and time.  Recent memory is intact.  Remote memory is intact.  Attention span and concentration are intact.  Expressive speech is intact.  Patient's fund of knowledge is within normal limits for educational level.                            Gross Musculoskeletal Assessment Tremor: None Bulk: Normal Tone: Normal Pt presents with significant L knee genu valgum, L ankle pronation      GAIT: Distance walked: 50 ft Assistive device utilized: Single point cane Level of assistance: SBA Comments: Mild dynamic valgus LLE, overpronation      AROM       AROM (Normal range in degrees) AROM  10/08/2022  Lumbar    Flexion (65) WNL  Extension (30) WNL (pain in back)  Right lateral flexion (25)  WNL  Left lateral flexion (25) WNL  Right rotation (30)    Left rotation (30)                  Knee      Flexion (135) WNL 125  Extension (0) +10 +8         Ankle      Dorsiflexion (20)  WNL WNL  Plantarflexion (50)      Inversion (35)      Eversion (15      (* = pain; Blank rows = not tested)     LE MMT:   MMT (out of 5) Right 10/08/2022 Left 10/08/2022 Right 11/07/22 Left 11/07/22  Hip flexion _0 Hip extension 4+ 4- (cramp) 4+ 4+  Hip abduction 4+ 4+ 4+ 4+  Hip adduction        Hip internal rotation        Hip external rotation        Knee flexion _1 Knee extension 5 4+ 5 5  Ankle dorsiflexion        Ankle plantarflexion  Ankle inversion        Ankle eversion        (* = pain; Blank rows = not tested)     Sensation Mild sensory loss in great toes to light touch bilateral LEs as determined by testing dermatomes L2-S2. Proprioception, and hot/cold testing deferred on this date.       Muscle Length Hamstrings: R: Not examined L: 10 deg lacking in 90/90 Quadriceps Pat Patrick): R: Positive L: Positive Hip flexors Marcello Moores): R: Not done L: Not done IT band Nicoletta Dress): R: Not done L: Not done Gastrocnemius: R: Normal  L: Normal  (10/09/22)   Palpation   Location LEFT  RIGHT           Quadriceps 0 0  Medial Hamstrings 0 0  Lateral Hamstrings 0 0  Lateral Hamstring tendon 0 0  Medial Hamstring tendon 0 0  Quadriceps tendon      Patella 0 0  Patellar Tendon      Tibial Tuberosity      Medial joint line 0 0  Lateral joint line 0 0  MCL 0 0  LCL 0 0  Adductor Tubercle      Pes Anserine tendon      Infrapatellar fat pad      Fibular head   0  Popliteal fossa   1 (mild)  (Blank rows = not tested) Graded on 0-4 scale (0 = no pain, 1 = pain, 2 = pain with wincing/grimacing/flinching, 3 = pain with withdrawal, 4 = unwilling to allow palpation), (Blank rows = not tested)     Passive Accessory Motion   Patellofemoral: Superior Glide: R: Positive for  moderate restriction L: Positive for moderate restriction Inferior Glide: R: Positive for moderate restriction L: Positive for moderate restriction Medial Glide: R: Negative L: Negative Lateral Glide: R: Negative L: Negative        SPECIAL TESTS   Lumbar Radiculopathy SLR: R: Negative, L: Negative SLUMP: R: Negative, : Negative Repeated flexion: mild discomfort in back during, no reproduction of concordant popliteal fossa pain    Meniscus Tests McMurray's Test:  Medial Meniscus (Tibial ER): R: Negative L: Negative Lateral Meniscus (Tibial IR): R: Negative L: Negative (-) meniscal signs per clinical prediction rule       FUNCTIONAL TASKS   Pt able to readily perform sit to stand with no upper extremity support         TODAY'S TREATMENT   SUBJECTIVE: Pt reports transient episode yesterday of shooting pain along anterior knee that improved after a moment. He reports completing HEP yesterday and feeling better afterward. Patient reports pain around 1-2/10 over the previous week. 98% SANE score at this time. Patient reports he can get around house well, but he is limited with heavier lifting and with his balance. He reports that comorbid RLE pain has improved with exercise.   PAIN:  Are you having pain? 2/10 pain this AM  Pain location: L posterior knee/popliteal region     Manual Therapy - for symptom modulation, soft tissue sensitivity and mobility, joint mobility, ROM   L Tibiofemoral joint mobilization gr I-II A-P, for pain control 2x30 sec bouts  L fibular head mobilization A-P, gr III for joint mobility; 2x30 sec bouts  STM and IASTM with Theraband roller along L hamstrings and gastrocnemius; x 8 minutes   Therapeutic Exercise - for improved soft tissue flexibility and extensibility as needed for ROM, improved strength as needed to improve performance of CKC activities/functional movements   GOAL  UPDATE PERFORMED  NuStep, x 5 minutes; Level 3, for soft tissue  extensibility/mobility  -subjective information gathered during this time intermittently, 1 minute unbilled  -pt requests MHP along anterior thigh for c/o burning Straight leg raise; 2x8 with 3-lb ankle weight - verbal cueing for maintaining quad contraction and eccentric control Sidelying hip abduction; 2x10, 1.5-b;  Minisquat 0-30 deg; 2x8, therapist guarding posteriorly   *next visit* Sit to stand with Blue Tband around distal thighs to improve dynamic valgus of bilat LE; 2x8  *not today* Dynamic hamstrings stretch (sciatic nerve floss technique); 2x10 [employer] double-limb squat, Level 22; depth to tolerance; 2x10 Glute bridge; 2x10. Blue Tband above knees  Seated knee extension; 7.5 lbs 1x10, 10 lbs 1x10 Seated hamstring stretch; 2x30sec - for review Prone Hip extension 2x10 alternating  Standing calf stretch, lunge with upper limb support on treadmill; 2x30 sec  -focused on LLE in back for L calf flexibility, pt has onset of L knee pain with LLE in front     Cold pack (unbilled) - for anti-inflammatory and analgesic effect as needed for reduced pain and improved ability to participate in active PT intervention, along L knee in supine with pillow behind L knee and calf, x 5 minutes  -utilized post-treatment today     PATIENT EDUCATION:  Education details: Exercise technique as needed for carryover to HEP Person educated: Patient Education method: Explanation Education comprehension: verbalized understanding     HOME EXERCISE PROGRAM: Access Code: [email or web address] URL: [email or web address] Date: 10/15/2022 Prepared by: Valentina Gu  Exercises - Seated Hamstring Stretch  - 2 x daily - 7 x weekly - 2 sets - 30sec hold - Gastroc Stretch on Wall  - 2 x daily - 7 x weekly - 3 sets - 30sec hold - Supine Active Straight Leg Raise  - 2 x daily - 7 x weekly - 2 sets - 10 reps     ASSESSMENT:   CLINICAL IMPRESSION: Patient has improved strength and NPRS  compared to initial evaluation. Patient has maintained his stretching program and [place] strengthening exercises on his HEP well. He has improved FOTO score (small improvement), but he has not yet met FOTO predicted score. Pt still tolerates only up to 15 minutes of standing activity at a time and has difficulty with attempted 3-way lunge today - pain along infrapatellar region in anterior knee with forward lunge. Pain is improved with use of cold pack and period of rest today. Pt is motivated to improve his LE strength, function, and his balance. He has remaining L knee pain with significant volume of closed-chain activity and has remaining activity limitations in gait stability and negotiating obstacles/turns associated with hx of transverse myelitis and neurosarcoidosis. Pt may have plateau in functional progress related to these comorbid conditions. Pt has participated very well with PT to date and will likely be able to achieve higher level of function with continued PT intervention. Pt has remaining deficits in L gluteal strength, mild hamstring flexibility deficit, medial hamstring and calf sensitivity on L, sensory/proprioception impairment of lower limbs. Patient will benefit from continued skilled PT to address above impairments and improve overall function and QoL.    REHAB POTENTIAL: Good   CLINICAL DECISION MAKING: Evolving/moderate complexity   EVALUATION COMPLEXITY: Moderate     GOALS:   SHORT TERM GOALS: Target date: 10/31/2022   Pt will be independent with HEP to improve strength and decrease knee pain to improve pain-free function at home and work. Baseline: 10/08/22: Discussed stretching for home  program, formal HEP to be given on visit # 2.  10/10/22: Formal HEP initiated.   11/07/22: Pt is compliant with HEP.  Goal status: ACHIEVED     LONG TERM GOALS: Target date: 11/21/2022   Pt will increase FOTO to at least 63 to demonstrate significant improvement in function at home and work  related to knee pain  Baseline: 10/08/22: 45.   11/07/22:  49. Goal status: IN PROGRESS    2.  Pt will decrease worst knee pain by at least 3 points on the NPRS in order to demonstrate clinically significant reduction in back pain. Baseline: 10/08/22: Knee pain 9/10 at worst.   11/07/22: Pain up to 1-2/10 this past week.  Goal status: ACHIEVED   3.  Pt will increase strength of L hip and knee musculature measured at initial eval by at least 1/2 MMT grade in order to demonstrate improvement in strength and function as needed for improved ability to perform stair climbing and squatting Baseline: 10/08/22: Significant pain with repetitive stair climbing, squatting/bending down, and pivoting.    11/07/22: Improved hip flexor, hip extensor, and quad strength  Goal status: IN PROGRESS/ON-GOING   4.  Pt will tolerate standing activity up to 1 hour without exacerbation of knee pain as needed for completing community outings and errands in town Baseline: 10/08/22: Pt tolerate standing up to 20-30 minutes per self-report.     11/07/22: Standing up to 15 minutes Goal status: NOT MET    5.  Pt will perform 3-way lunge without loss of frontal plane control and no LOB or knee buckling with no exacerbation of knee pain indicative of improved ability to complete multi-planar movements with weightbearing on LLE and improved tolerance of pivoting as needed for functional mobility Baseline: 10/08/22: Significant pain with pivoting and turning when weightbearing on LLE.      11/07/22: Attempted today with pain primarily with forward lunging Goal status: NOT MET     PLAN: PT FREQUENCY: 2x/week   PT DURATION: 6 weeks   PLANNED INTERVENTIONS: Therapeutic exercises, Therapeutic activity, Neuromuscular re-education, Balance training, Gait training, Patient/Family education, Electrical stimulation, Cryotherapy, Moist heat, Taping, and Manual therapy   PLAN FOR NEXT SESSION: STM/IASTM to improve soft tissue mobility and  sensitivity along posterior thigh. Progress with quadriceps and hip abductor/extensor/flexor strengthening. Progress to closed-chain movements as tolerated as weightbearing activity tolerance improves.  Recommend continued PT 2x/week for 4 weeks.      Valentina Gu, PT, DPT #[id number]  Eilleen Kempf, PT 11/11/2022, 9:38 AM

## 2022-11-11 ENCOUNTER — Encounter: Payer: Self-pay | Admitting: Physical Therapy

## 2022-11-12 ENCOUNTER — Ambulatory Visit: Payer: Medicare Other | Admitting: Physical Therapy

## 2022-11-12 DIAGNOSIS — M25562 Pain in left knee: Secondary | ICD-10-CM | POA: Diagnosis not present

## 2022-11-12 DIAGNOSIS — G8929 Other chronic pain: Secondary | ICD-10-CM

## 2022-11-12 DIAGNOSIS — M6281 Muscle weakness (generalized): Secondary | ICD-10-CM

## 2022-11-12 DIAGNOSIS — R262 Difficulty in walking, not elsewhere classified: Secondary | ICD-10-CM

## 2022-11-12 NOTE — Therapy (Signed)
OUTPATIENT PHYSICAL THERAPY TREATMENT  Patient Name: William Lynn MRN: [id number] DOB:December 11, 1961, 61 y.o., male Today's Date: 11/12/2022  PCP: [facility] REFERRING PROVIDER: Darrol Jump, MD  END OF SESSION:   PT End of Session - 11/12/22 0844     Visit Number 11    Number of Visits 13    Date for PT Re-Evaluation 01/05/23    Authorization Type eval 10/08/22    PT Start Time 0819    PT Stop Time 0900    PT Time Calculation (min) 41 min    Equipment Utilized During Treatment --   pt utlizies personal SPC for gait intermittently during episodes of heightened pain   Activity Tolerance Patient tolerated treatment well    Behavior During Therapy WFL for tasks assessed/performed                Past Medical History:  Diagnosis Date   GERD (gastroesophageal reflux disease)    Neuropathy    Sarcoidosis    Past Surgical History:  Procedure Laterality Date   HERNIA REPAIR     KNEE SURGERY Right    SHOULDER SURGERY Bilateral    Patient Active Problem List   Diagnosis Date Noted   TIA (transient ischemic attack) 10/13/2021   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   On corticosteroid therapy 06/21/2018   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   Gait abnormality 02/25/2018   Low serum thyroid stimulating hormone (TSH) 02/25/2018   Serum gammaglobulin increased 02/25/2018   Muscle weakness (generalized) 06/07/2014   Closed fracture of glenoid cavity and neck of scapula 06/03/2014   S/P arthroscopy of shoulder 06/03/2014   Stiffness of joint, not elsewhere classified,  shoulder region 06/03/2014    REFERRING DIAG:  M25.562 (ICD-10-CM) - Pain in left knee  G89.29 (ICD-10-CM) - Other chronic pain  R29.898 (ICD-10-CM) - Other symptoms and signs involving the musculoskeletal system  M71.22 (ICD-10-CM) - Synovial cyst of popliteal space (Baker), left knee  M76.892 (ICD-10-CM) - Other specified enthesopathies of left lower limb, excluding foot    THERAPY DIAG:   Chronic pain of left knee  Difficulty in walking, not elsewhere classified  Muscle weakness (generalized)  Rationale for Evaluation and Treatment Rehabilitation  PERTINENT HISTORY: Patient is a 61 year old male referred for L knee pain with Hx of R knee surgery and hx of TIA associated with hypertension. Patient has Baker's cyst L knee. Hx of L ACL tear with reconstruction about 30 years ago.  Patient reports having blood clot in R calf confirmed with Doppler (superficial venous thrombosis in greater saphenous vein). Pt is using compression stockings at this time.  Pt has pain in posterior knee/popliteal fossa. Patient reports having "a little back pain." Patient is unsure of back pain happens with posterior knee pain. Patient reports disturbed sleep from leg cramps. Patient reports atraumatic onset of pain.    Pain:  Pain Intensity: Present: 0/10, Best: 0/10, Worst: 9/10 Pain location: Posterior knee/popliteal fossa Pain quality: previously sharp, feeling of "puffiness" in posterior knee/popliteal region  Radiating pain: No  Swelling: Yes , posterior knee  Popping, catching, locking: Yes , catching with leg rotating in closed-chain position  Numbness/Tingling: Yes, hx of long-standing numbness/tingling in toes from peripheral neuropathy  Focal weakness or buckling: No Aggravating factors: twisting leg in closed-chain flexion, standing, turning/pivoting, repetitive stair climbing or prolonged walking around house, squatting/bending down  Relieving factors: sitting, non-weightbearing 24-hour pain behavior: continuous pain throughout day  How long can you stand: 20-30 minutes History of prior back, hip, or  knee injury, pain, surgery, or therapy: Yes, hx of L ACL reconstruction 30 years, hx of back pain  Falls: Has patient fallen in last 6 months? No, Number of falls: N/A Follow-up appointment with MD: F/u with MD after PT Imaging: Yes , Ultrasound R LE   IMPRESSION 1. No evidence of  deep venous thrombosis in the RIGHT lower extremity. 2. Superficial venous thrombosis of the greater saphenous vein.   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[employer], difficulty with yard work/cutting grass c push mower Red flags (bowel/bladder changes, saddle paresthesia, personal history of cancer, h/o spinal tumors, h/o compression fx, h/o abdominal aneurysm, abdominal pain, chills/fever, night sweats, nausea, vomiting, unrelenting pain): Negative   Precautions: Superficial venous thrombosis of the greater saphenous vein in RLE   Weight Bearing Restrictions: No   Living Environment Lives with: lives with their spouse, nephew and his brother lives close by Lives in: House/apartment, stairs to get in home     Patient Goals: Resolved pain and "puffiness" in knee      PRECAUTIONS: Superficial venous thrombosis of the greater saphenous vein in RLE, Hx of transverse myelitis   OBJECTIVE: (objective measures completed at initial evaluation unless otherwise dated)  Patient Surveys  FOTO 45, predicted score of 40     Cognition Patient is oriented to person, place, and time.  Recent memory is intact.  Remote memory is intact.  Attention span and concentration are intact.  Expressive speech is intact.  Patient's fund of knowledge is within normal limits for educational level.                            Gross Musculoskeletal Assessment Tremor: None Bulk: Normal Tone: Normal Pt presents with significant L knee genu valgum, L ankle pronation      GAIT: Distance walked: 50 ft Assistive device utilized: Single point cane Level of assistance: SBA Comments: Mild dynamic valgus LLE, overpronation      AROM       AROM (Normal range in degrees) AROM  10/08/2022  Lumbar    Flexion (65) WNL  Extension (30) WNL (pain in back)  Right lateral flexion (25) WNL  Left lateral flexion (25) WNL  Right rotation (30)    Left rotation  (30)                  Knee      Flexion (135) WNL 125  Extension (0) +10 +8         Ankle      Dorsiflexion (20)  WNL WNL  Plantarflexion (50)      Inversion (35)      Eversion (15      (* = pain; Blank rows = not tested)     LE MMT:   MMT (out of 5) Right 10/08/2022 Left 10/08/2022 Right 11/07/22 Left 11/07/22  Hip flexion _0 Hip extension 4+ 4- (cramp) 4+ 4+  Hip abduction 4+ 4+ 4+ 4+  Hip adduction        Hip internal rotation        Hip external rotation        Knee flexion _1 Knee extension 5 4+ 5 5  Ankle dorsiflexion        Ankle plantarflexion        Ankle inversion        Ankle eversion        (* =  pain; Blank rows = not tested)     Sensation Mild sensory loss in great toes to light touch bilateral LEs as determined by testing dermatomes L2-S2. Proprioception, and hot/cold testing deferred on this date.       Muscle Length Hamstrings: R: Not examined L: 10 deg lacking in 90/90 Quadriceps Pat Patrick): R: Positive L: Positive Hip flexors Marcello Moores): R: Not done L: Not done IT band Nicoletta Dress): R: Not done L: Not done Gastrocnemius: R: Normal  L: Normal  (10/09/22)   Palpation   Location LEFT  RIGHT           Quadriceps 0 0  Medial Hamstrings 0 0  Lateral Hamstrings 0 0  Lateral Hamstring tendon 0 0  Medial Hamstring tendon 0 0  Quadriceps tendon      Patella 0 0  Patellar Tendon      Tibial Tuberosity      Medial joint line 0 0  Lateral joint line 0 0  MCL 0 0  LCL 0 0  Adductor Tubercle      Pes Anserine tendon      Infrapatellar fat pad      Fibular head   0  Popliteal fossa   1 (mild)  (Blank rows = not tested) Graded on 0-4 scale (0 = no pain, 1 = pain, 2 = pain with wincing/grimacing/flinching, 3 = pain with withdrawal, 4 = unwilling to allow palpation), (Blank rows = not tested)     Passive Accessory Motion   Patellofemoral: Superior Glide: R: Positive for moderate restriction L: Positive for moderate restriction Inferior Glide:  R: Positive for moderate restriction L: Positive for moderate restriction Medial Glide: R: Negative L: Negative Lateral Glide: R: Negative L: Negative        SPECIAL TESTS   Lumbar Radiculopathy SLR: R: Negative, L: Negative SLUMP: R: Negative, : Negative Repeated flexion: mild discomfort in back during, no reproduction of concordant popliteal fossa pain    Meniscus Tests McMurray's Test:  Medial Meniscus (Tibial ER): R: Negative L: Negative Lateral Meniscus (Tibial IR): R: Negative L: Negative (-) meniscal signs per clinical prediction rule       FUNCTIONAL TASKS   Pt able to readily perform sit to stand with no upper extremity support         TODAY'S TREATMENT   SUBJECTIVE: Pt reports he may have overdone it yesterday with having to stand for prolonged period at bus stop for his child. He reports mild pain at arrival affecting popliteal region. Pt reports compliance with HEP.   PAIN:  Are you having pain? 1/10 pain this AM  Pain location: L posterior knee/popliteal region     Manual Therapy - for symptom modulation, soft tissue sensitivity and mobility, joint mobility, ROM   L Tibiofemoral joint mobilization gr I-II A-P, for pain control 2x30 sec bouts  L fibular head mobilization A-P, gr III for joint mobility; 2x30 sec bouts  STM and IASTM with Theraband roller along L hamstrings and gastrocnemius; x 8 minutes   Therapeutic Exercise - for improved soft tissue flexibility and extensibility as needed for ROM, improved strength as needed to improve performance of CKC activities/functional movements   NuStep, x 5 minutes; Level 5, for soft tissue extensibility/mobility  -subjective information gathered during this time intermittently, 1 minute unbilled  -pt requests MHP along anterior thigh for c/o burning Sidelying hip abduction; 2x8, 2-lb;  Sit to stand with Blue Tband around distal thighs to improve dynamic valgus of bilat LE; 2x8 Minisquat  0-30 deg; 2x8,  therapist guarding posteriorly Forward step-up; 1x10, 6-inch step    *not today* Straight leg raise; 2x8 with 3-lb ankle weight - verbal cueing for maintaining quad contraction and eccentric control Dynamic hamstrings stretch (sciatic nerve floss technique); 2x10 [employer] double-limb squat, Level 22; depth to tolerance; 2x10 Glute bridge; 2x10. Blue Tband above knees  Seated knee extension; 7.5 lbs 1x10, 10 lbs 1x10 Seated hamstring stretch; 2x30sec - for review Prone Hip extension 2x10 alternating  Standing calf stretch, lunge with upper limb support on treadmill; 2x30 sec  -focused on LLE in back for L calf flexibility, pt has onset of L knee pain with LLE in front Cold pack (unbilled) - for anti-inflammatory and analgesic effect as needed for reduced pain and improved ability to participate in active PT intervention, along L knee in supine with pillow behind L knee and calf, x 5 minutes  -utilized post-treatment today         PATIENT EDUCATION:  Education details: Exercise technique as needed for carryover to HEP Person educated: Patient Education method: Explanation Education comprehension: verbalized understanding     HOME EXERCISE PROGRAM: Access Code: [email or web address] URL: [email or web address] Date: 10/15/2022 Prepared by: Valentina Gu  Exercises - Seated Hamstring Stretch  - 2 x daily - 7 x weekly - 2 sets - 30sec hold - Gastroc Stretch on Wall  - 2 x daily - 7 x weekly - 3 sets - 30sec hold - Supine Active Straight Leg Raise  - 2 x daily - 7 x weekly - 2 sets - 10 reps     ASSESSMENT:   CLINICAL IMPRESSION: Patient is able to continue with hip strengthening and CKC strengthening without notable pain. He does have remaining popliteal pain and decreased LE strength. Pt tolerates session well today and reports no significant post-treatment pain. Pt has remaining deficits in L gluteal strength, mild hamstring flexibility deficit, medial hamstring and calf  sensitivity on L, sensory/proprioception impairment of lower limbs. Patient will benefit from continued skilled PT to address above impairments and improve overall function and QoL.    REHAB POTENTIAL: Good   CLINICAL DECISION MAKING: Evolving/moderate complexity   EVALUATION COMPLEXITY: Moderate     GOALS:   SHORT TERM GOALS: Target date: 10/31/2022   Pt will be independent with HEP to improve strength and decrease knee pain to improve pain-free function at home and work. Baseline: 10/08/22: Discussed stretching for home program, formal HEP to be given on visit # 2.  10/10/22: Formal HEP initiated.   11/07/22: Pt is compliant with HEP.  Goal status: ACHIEVED     LONG TERM GOALS: Target date: 11/21/2022   Pt will increase FOTO to at least 63 to demonstrate significant improvement in function at home and work related to knee pain  Baseline: 10/08/22: 45.   11/07/22:  49. Goal status: IN PROGRESS    2.  Pt will decrease worst knee pain by at least 3 points on the NPRS in order to demonstrate clinically significant reduction in back pain. Baseline: 10/08/22: Knee pain 9/10 at worst.   11/07/22: Pain up to 1-2/10 this past week.  Goal status: ACHIEVED   3.  Pt will increase strength of L hip and knee musculature measured at initial eval by at least 1/2 MMT grade in order to demonstrate improvement in strength and function as needed for improved ability to perform stair climbing and squatting Baseline: 10/08/22: Significant pain with repetitive stair climbing, squatting/bending down, and pivoting.    11/07/22: Improved  hip flexor, hip extensor, and quad strength  Goal status: IN PROGRESS/ON-GOING   4.  Pt will tolerate standing activity up to 1 hour without exacerbation of knee pain as needed for completing community outings and errands in town Baseline: 10/08/22: Pt tolerate standing up to 20-30 minutes per self-report.     11/07/22: Standing up to 15 minutes Goal status: NOT MET    5.  Pt  will perform 3-way lunge without loss of frontal plane control and no LOB or knee buckling with no exacerbation of knee pain indicative of improved ability to complete multi-planar movements with weightbearing on LLE and improved tolerance of pivoting as needed for functional mobility Baseline: 10/08/22: Significant pain with pivoting and turning when weightbearing on LLE.      11/07/22: Attempted today with pain primarily with forward lunging Goal status: NOT MET     PLAN: PT FREQUENCY: 2x/week   PT DURATION: 6 weeks   PLANNED INTERVENTIONS: Therapeutic exercises, Therapeutic activity, Neuromuscular re-education, Balance training, Gait training, Patient/Family education, Electrical stimulation, Cryotherapy, Moist heat, Taping, and Manual therapy   PLAN FOR NEXT SESSION: STM/IASTM to improve soft tissue mobility and sensitivity along posterior thigh. Progress with quadriceps and hip abductor/extensor/flexor strengthening. Progress to closed-chain movements as tolerated as weightbearing activity tolerance improves.      Valentina Gu, PT, DPT #[id number]  Eilleen Kempf, PT 11/12/2022, 8:44 AM

## 2022-11-14 ENCOUNTER — Encounter: Payer: Self-pay | Admitting: Physical Therapy

## 2022-11-14 ENCOUNTER — Ambulatory Visit: Payer: Medicare Other | Admitting: Physical Therapy

## 2022-11-14 DIAGNOSIS — R2681 Unsteadiness on feet: Secondary | ICD-10-CM

## 2022-11-14 DIAGNOSIS — M25562 Pain in left knee: Secondary | ICD-10-CM | POA: Diagnosis not present

## 2022-11-14 DIAGNOSIS — M6281 Muscle weakness (generalized): Secondary | ICD-10-CM

## 2022-11-14 DIAGNOSIS — R262 Difficulty in walking, not elsewhere classified: Secondary | ICD-10-CM

## 2022-11-14 DIAGNOSIS — G8929 Other chronic pain: Secondary | ICD-10-CM

## 2022-11-14 NOTE — Therapy (Signed)
OUTPATIENT PHYSICAL THERAPY TREATMENT  Patient Name: William Lynn MRN: [id number] DOB:03-13-1961, 61 y.o., male Today's Date: 11/16/2022  PCP: [facility] REFERRING PROVIDER: Darrol Jump, MD  END OF SESSION:   PT End of Session - 11/16/22 2124     Visit Number 12    Number of Visits 18    Date for PT Re-Evaluation 01/05/23    Authorization Type eval 10/08/22    PT Start Time 0905    PT Stop Time 0946    PT Time Calculation (min) 41 min    Equipment Utilized During Treatment --   pt utlizies personal SPC for gait intermittently during episodes of heightened pain   Activity Tolerance Patient tolerated treatment well    Behavior During Therapy WFL for tasks assessed/performed                 Past Medical History:  Diagnosis Date   GERD (gastroesophageal reflux disease)    Neuropathy    Sarcoidosis    Past Surgical History:  Procedure Laterality Date   HERNIA REPAIR     KNEE SURGERY Right    SHOULDER SURGERY Bilateral    Patient Active Problem List   Diagnosis Date Noted   TIA (transient ischemic attack) 10/13/2021   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   On corticosteroid therapy 06/21/2018   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   Gait abnormality 02/25/2018   Low serum thyroid stimulating hormone (TSH) 02/25/2018   Serum gammaglobulin increased 02/25/2018   Muscle weakness (generalized) 06/07/2014   Closed fracture of glenoid cavity and neck of scapula 06/03/2014   S/P arthroscopy of shoulder 06/03/2014   Stiffness of joint, not elsewhere classified,  shoulder region 06/03/2014    REFERRING DIAG:  M25.562 (ICD-10-CM) - Pain in left knee  G89.29 (ICD-10-CM) - Other chronic pain  R29.898 (ICD-10-CM) - Other symptoms and signs involving the musculoskeletal system  M71.22 (ICD-10-CM) - Synovial cyst of popliteal space (Baker), left knee  M76.892 (ICD-10-CM) - Other specified enthesopathies of left lower limb, excluding foot    THERAPY  DIAG:  Chronic pain of left knee  Difficulty in walking, not elsewhere classified  Muscle weakness (generalized)  Unsteadiness on feet  Rationale for Evaluation and Treatment Rehabilitation  PERTINENT HISTORY: Patient is a 61 year old male referred for L knee pain with Hx of R knee surgery and hx of TIA associated with hypertension. Patient has Baker's cyst L knee. Hx of L ACL tear with reconstruction about 30 years ago.  Patient reports having blood clot in R calf confirmed with Doppler (superficial venous thrombosis in greater saphenous vein). Pt is using compression stockings at this time.  Pt has pain in posterior knee/popliteal fossa. Patient reports having "a little back pain." Patient is unsure of back pain happens with posterior knee pain. Patient reports disturbed sleep from leg cramps. Patient reports atraumatic onset of pain.    Pain:  Pain Intensity: Present: 0/10, Best: 0/10, Worst: 9/10 Pain location: Posterior knee/popliteal fossa Pain quality: previously sharp, feeling of "puffiness" in posterior knee/popliteal region  Radiating pain: No  Swelling: Yes , posterior knee  Popping, catching, locking: Yes , catching with leg rotating in closed-chain position  Numbness/Tingling: Yes, hx of long-standing numbness/tingling in toes from peripheral neuropathy  Focal weakness or buckling: No Aggravating factors: twisting leg in closed-chain flexion, standing, turning/pivoting, repetitive stair climbing or prolonged walking around house, squatting/bending down  Relieving factors: sitting, non-weightbearing 24-hour pain behavior: continuous pain throughout day  How long can you stand: 20-30 minutes History  of prior back, hip, or knee injury, pain, surgery, or therapy: Yes, hx of L ACL reconstruction 30 years, hx of back pain  Falls: Has patient fallen in last 6 months? No, Number of falls: N/A Follow-up appointment with MD: F/u with MD after PT Imaging: Yes , Ultrasound R LE    IMPRESSION 1. No evidence of deep venous thrombosis in the RIGHT lower extremity. 2. Superficial venous thrombosis of the greater saphenous vein.   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[employer], difficulty with yard work/cutting grass c push mower Red flags (bowel/bladder changes, saddle paresthesia, personal history of cancer, h/o spinal tumors, h/o compression fx, h/o abdominal aneurysm, abdominal pain, chills/fever, night sweats, nausea, vomiting, unrelenting pain): Negative   Precautions: Superficial venous thrombosis of the greater saphenous vein in RLE   Weight Bearing Restrictions: No   Living Environment Lives with: lives with their spouse, nephew and his brother lives close by Lives in: House/apartment, stairs to get in home     Patient Goals: Resolved pain and "puffiness" in knee      PRECAUTIONS: Superficial venous thrombosis of the greater saphenous vein in RLE, Hx of transverse myelitis   OBJECTIVE: (objective measures completed at initial evaluation unless otherwise dated)  Patient Surveys  FOTO 45, predicted score of 63     Cognition Patient is oriented to person, place, and time.  Recent memory is intact.  Remote memory is intact.  Attention span and concentration are intact.  Expressive speech is intact.  Patient's fund of knowledge is within normal limits for educational level.                            Gross Musculoskeletal Assessment Tremor: None Bulk: Normal Tone: Normal Pt presents with significant L knee genu valgum, L ankle pronation      GAIT: Distance walked: 50 ft Assistive device utilized: Single point cane Level of assistance: SBA Comments: Mild dynamic valgus LLE, overpronation      AROM       AROM (Normal range in degrees) AROM  10/08/2022  Lumbar    Flexion (65) WNL  Extension (30) WNL (pain in back)  Right lateral flexion (25) WNL  Left lateral flexion (25) WNL  Right  rotation (30)    Left rotation (30)                  Knee      Flexion (135) WNL 125  Extension (0) +10 +8         Ankle      Dorsiflexion (20)  WNL WNL  Plantarflexion (50)      Inversion (35)      Eversion (15      (* = pain; Blank rows = not tested)     LE MMT:   MMT (out of 5) Right 10/08/2022 Left 10/08/2022 Right 11/07/22 Left 11/07/22  Hip flexion _0 Hip extension 4+ 4- (cramp) 4+ 4+  Hip abduction 4+ 4+ 4+ 4+  Hip adduction        Hip internal rotation        Hip external rotation        Knee flexion _1 Knee extension 5 4+ 5 5  Ankle dorsiflexion        Ankle plantarflexion        Ankle inversion        Ankle eversion        (* =  pain; Blank rows = not tested)     Sensation Mild sensory loss in great toes to light touch bilateral LEs as determined by testing dermatomes L2-S2. Proprioception, and hot/cold testing deferred on this date.       Muscle Length Hamstrings: R: Not examined L: 10 deg lacking in 90/90 Quadriceps Pat Patrick): R: Positive L: Positive Hip flexors Marcello Moores): R: Not done L: Not done IT band Nicoletta Dress): R: Not done L: Not done Gastrocnemius: R: Normal  L: Normal  (10/09/22)   Palpation   Location LEFT  RIGHT           Quadriceps 0 0  Medial Hamstrings 0 0  Lateral Hamstrings 0 0  Lateral Hamstring tendon 0 0  Medial Hamstring tendon 0 0  Quadriceps tendon      Patella 0 0  Patellar Tendon      Tibial Tuberosity      Medial joint line 0 0  Lateral joint line 0 0  MCL 0 0  LCL 0 0  Adductor Tubercle      Pes Anserine tendon      Infrapatellar fat pad      Fibular head   0  Popliteal fossa   1 (mild)  (Blank rows = not tested) Graded on 0-4 scale (0 = no pain, 1 = pain, 2 = pain with wincing/grimacing/flinching, 3 = pain with withdrawal, 4 = unwilling to allow palpation), (Blank rows = not tested)     Passive Accessory Motion   Patellofemoral: Superior Glide: R: Positive for moderate restriction L: Positive for  moderate restriction Inferior Glide: R: Positive for moderate restriction L: Positive for moderate restriction Medial Glide: R: Negative L: Negative Lateral Glide: R: Negative L: Negative        SPECIAL TESTS   Lumbar Radiculopathy SLR: R: Negative, L: Negative SLUMP: R: Negative, : Negative Repeated flexion: mild discomfort in back during, no reproduction of concordant popliteal fossa pain    Meniscus Tests McMurray's Test:  Medial Meniscus (Tibial ER): R: Negative L: Negative Lateral Meniscus (Tibial IR): R: Negative L: Negative (-) meniscal signs per clinical prediction rule       FUNCTIONAL TASKS   Pt able to readily perform sit to stand with no upper extremity support         TODAY'S TREATMENT   SUBJECTIVE: Pt reports feeling generally well at arrival. He reports mild pain affecting L popliteal space. He does have comorbid low back pain and stiffness that bothers him more in AM.   PAIN:  Are you having pain? 1/10 pain this AM  Pain location: L posterior knee/popliteal region     Manual Therapy - for symptom modulation, soft tissue sensitivity and mobility, joint mobility, ROM   L Tibiofemoral joint mobilization gr I-II A-P, for pain control 2x30 sec bouts  L fibular head mobilization A-P, gr III for joint mobility; 2x30 sec bouts  STM and IASTM with Theraband roller along L hamstrings and gastrocnemius; x 8 minutes   Therapeutic Exercise - for improved soft tissue flexibility and extensibility as needed for ROM, improved strength as needed to improve performance of CKC activities/functional movements   NuStep, x 5 minutes; Level 5, for soft tissue extensibility/mobility  -subjective information gathered during this time Sidelying hip abduction; 2x8, 2-lb;  Sit to stand with Blue Tband around distal thighs to improve dynamic valgus of bilat LE; 2x8 Minisquat 0-30 deg; 2x10, therapist guarding posteriorly Forward step-up; 2x10, 6-inch step    *not  today* Straight leg  raise; 2x8 with 3-lb ankle weight - verbal cueing for maintaining quad contraction and eccentric control Dynamic hamstrings stretch (sciatic nerve floss technique); 2x10 [employer] double-limb squat, Level 22; depth to tolerance; 2x10 Glute bridge; 2x10. Blue Tband above knees  Seated knee extension; 7.5 lbs 1x10, 10 lbs 1x10 Seated hamstring stretch; 2x30sec - for review Prone Hip extension 2x10 alternating  Standing calf stretch, lunge with upper limb support on treadmill; 2x30 sec  -focused on LLE in back for L calf flexibility, pt has onset of L knee pain with LLE in front Cold pack (unbilled) - for anti-inflammatory and analgesic effect as needed for reduced pain and improved ability to participate in active PT intervention, along L knee in supine with pillow behind L knee and calf, x 5 minutes  -utilized post-treatment today         PATIENT EDUCATION:  Education details: Exercise technique as needed for carryover to HEP Person educated: Patient Education method: Explanation Education comprehension: verbalized understanding     HOME EXERCISE PROGRAM: Access Code: [email or web address] URL: [email or web address] Date: 10/15/2022 Prepared by: Valentina Gu  Exercises - Seated Hamstring Stretch  - 2 x daily - 7 x weekly - 2 sets - 30sec hold - Gastroc Stretch on Wall  - 2 x daily - 7 x weekly - 3 sets - 30sec hold - Supine Active Straight Leg Raise  - 2 x daily - 7 x weekly - 2 sets - 10 reps     ASSESSMENT:   CLINICAL IMPRESSION: Patient fortunately reports low-level posterior knee (popliteal region) pain at this time. He has been maintaining his home exercise program well and is making good progress with tolerance of weightbearing activity. Pt is limited by comorbid back pain and stiffness that is worse in the AM. Discussed possible new plan of care for low back pending new referral; pt does not demonstrate any significant worsening of motor  symptoms associated with inflammation at the spinal cord (Hx of transverse myelitis, neurosarcoidosis).  Pt has remaining deficits in L gluteal strength, mild hamstring flexibility deficit, medial hamstring and calf sensitivity on L, sensory/proprioception impairment of lower limbs. Patient will benefit from continued skilled PT to address above impairments and improve overall function and QoL.    REHAB POTENTIAL: Good   CLINICAL DECISION MAKING: Evolving/moderate complexity   EVALUATION COMPLEXITY: Moderate     GOALS:   SHORT TERM GOALS: Target date: 10/31/2022   Pt will be independent with HEP to improve strength and decrease knee pain to improve pain-free function at home and work. Baseline: 10/08/22: Discussed stretching for home program, formal HEP to be given on visit # 2.  10/10/22: Formal HEP initiated.   11/07/22: Pt is compliant with HEP.  Goal status: ACHIEVED     LONG TERM GOALS: Target date: 11/21/2022   Pt will increase FOTO to at least 63 to demonstrate significant improvement in function at home and work related to knee pain  Baseline: 10/08/22: 45.   11/07/22:  49. Goal status: IN PROGRESS    2.  Pt will decrease worst knee pain by at least 3 points on the NPRS in order to demonstrate clinically significant reduction in back pain. Baseline: 10/08/22: Knee pain 9/10 at worst.   11/07/22: Pain up to 1-2/10 this past week.  Goal status: ACHIEVED   3.  Pt will increase strength of L hip and knee musculature measured at initial eval by at least 1/2 MMT grade in order to demonstrate improvement in strength and function as  needed for improved ability to perform stair climbing and squatting Baseline: 10/08/22: Significant pain with repetitive stair climbing, squatting/bending down, and pivoting.    11/07/22: Improved hip flexor, hip extensor, and quad strength  Goal status: IN PROGRESS/ON-GOING   4.  Pt will tolerate standing activity up to 1 hour without exacerbation of knee pain  as needed for completing community outings and errands in town Baseline: 10/08/22: Pt tolerate standing up to 20-30 minutes per self-report.     11/07/22: Standing up to 15 minutes Goal status: NOT MET    5.  Pt will perform 3-way lunge without loss of frontal plane control and no LOB or knee buckling with no exacerbation of knee pain indicative of improved ability to complete multi-planar movements with weightbearing on LLE and improved tolerance of pivoting as needed for functional mobility Baseline: 10/08/22: Significant pain with pivoting and turning when weightbearing on LLE.      11/07/22: Attempted today with pain primarily with forward lunging Goal status: NOT MET     PLAN: PT FREQUENCY: 2x/week   PT DURATION: 6 weeks   PLANNED INTERVENTIONS: Therapeutic exercises, Therapeutic activity, Neuromuscular re-education, Balance training, Gait training, Patient/Family education, Electrical stimulation, Cryotherapy, Moist heat, Taping, and Manual therapy   PLAN FOR NEXT SESSION: STM/IASTM to improve soft tissue mobility and sensitivity along posterior thigh. Progress with quadriceps and hip abductor/extensor/flexor strengthening. Progress to closed-chain movements as tolerated as weightbearing activity tolerance improves.      Valentina Gu, PT, DPT #[id number]  Eilleen Kempf, PT 11/16/2022, 9:25 PM

## 2022-11-19 ENCOUNTER — Encounter: Payer: Self-pay | Admitting: Physical Therapy

## 2022-11-19 ENCOUNTER — Ambulatory Visit: Payer: Medicare Other | Admitting: Physical Therapy

## 2022-11-19 DIAGNOSIS — R262 Difficulty in walking, not elsewhere classified: Secondary | ICD-10-CM

## 2022-11-19 DIAGNOSIS — G8929 Other chronic pain: Secondary | ICD-10-CM

## 2022-11-19 DIAGNOSIS — R2681 Unsteadiness on feet: Secondary | ICD-10-CM

## 2022-11-19 DIAGNOSIS — M25562 Pain in left knee: Secondary | ICD-10-CM | POA: Diagnosis not present

## 2022-11-19 DIAGNOSIS — M6281 Muscle weakness (generalized): Secondary | ICD-10-CM

## 2022-11-19 NOTE — Therapy (Signed)
OUTPATIENT PHYSICAL THERAPY TREATMENT  Patient Name: William Lynn MRN: [id number] DOB:October 04, 1961, 61 y.o., male Today's Date: 11/19/2022  PCP: [facility] REFERRING PROVIDER: Darrol Jump, MD  END OF SESSION:   PT End of Session - 11/19/22 0911     Visit Number 13    Number of Visits 18    Date for PT Re-Evaluation 01/05/23    Authorization Type eval 10/08/22    PT Start Time 0815    PT Stop Time 0901    PT Time Calculation (min) 46 min    Equipment Utilized During Treatment --   pt utlizies personal SPC for gait intermittently during episodes of heightened pain   Activity Tolerance Patient tolerated treatment well    Behavior During Therapy WFL for tasks assessed/performed                  Past Medical History:  Diagnosis Date   GERD (gastroesophageal reflux disease)    Neuropathy    Sarcoidosis    Past Surgical History:  Procedure Laterality Date   HERNIA REPAIR     KNEE SURGERY Right    SHOULDER SURGERY Bilateral    Patient Active Problem List   Diagnosis Date Noted   TIA (transient ischemic attack) 10/13/2021   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   On corticosteroid therapy 06/21/2018   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   Gait abnormality 02/25/2018   Low serum thyroid stimulating hormone (TSH) 02/25/2018   Serum gammaglobulin increased 02/25/2018   Muscle weakness (generalized) 06/07/2014   Closed fracture of glenoid cavity and neck of scapula 06/03/2014   S/P arthroscopy of shoulder 06/03/2014   Stiffness of joint, not elsewhere classified,  shoulder region 06/03/2014    REFERRING DIAG:  M25.562 (ICD-10-CM) - Pain in left knee  G89.29 (ICD-10-CM) - Other chronic pain  R29.898 (ICD-10-CM) - Other symptoms and signs involving the musculoskeletal system  M71.22 (ICD-10-CM) - Synovial cyst of popliteal space (Baker), left knee  M76.892 (ICD-10-CM) - Other specified enthesopathies of left lower limb, excluding foot    THERAPY  DIAG:  Chronic pain of left knee  Difficulty in walking, not elsewhere classified  Muscle weakness (generalized)  Unsteadiness on feet  Rationale for Evaluation and Treatment Rehabilitation  PERTINENT HISTORY: Patient is a 61 year old male referred for L knee pain with Hx of R knee surgery and hx of TIA associated with hypertension. Patient has Baker's cyst L knee. Hx of L ACL tear with reconstruction about 30 years ago.  Patient reports having blood clot in R calf confirmed with Doppler (superficial venous thrombosis in greater saphenous vein). Pt is using compression stockings at this time.  Pt has pain in posterior knee/popliteal fossa. Patient reports having "a little back pain." Patient is unsure of back pain happens with posterior knee pain. Patient reports disturbed sleep from leg cramps. Patient reports atraumatic onset of pain.    Pain:  Pain Intensity: Present: 0/10, Best: 0/10, Worst: 9/10 Pain location: Posterior knee/popliteal fossa Pain quality: previously sharp, feeling of "puffiness" in posterior knee/popliteal region  Radiating pain: No  Swelling: Yes , posterior knee  Popping, catching, locking: Yes , catching with leg rotating in closed-chain position  Numbness/Tingling: Yes, hx of long-standing numbness/tingling in toes from peripheral neuropathy  Focal weakness or buckling: No Aggravating factors: twisting leg in closed-chain flexion, standing, turning/pivoting, repetitive stair climbing or prolonged walking around house, squatting/bending down  Relieving factors: sitting, non-weightbearing 24-hour pain behavior: continuous pain throughout day  How long can you stand: 20-30 minutes  History of prior back, hip, or knee injury, pain, surgery, or therapy: Yes, hx of L ACL reconstruction 30 years, hx of back pain  Falls: Has patient fallen in last 6 months? No, Number of falls: N/A Follow-up appointment with MD: F/u with MD after PT Imaging: Yes , Ultrasound R LE    IMPRESSION 1. No evidence of deep venous thrombosis in the RIGHT lower extremity. 2. Superficial venous thrombosis of the greater saphenous vein.   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[employer], difficulty with yard work/cutting grass c push mower Red flags (bowel/bladder changes, saddle paresthesia, personal history of cancer, h/o spinal tumors, h/o compression fx, h/o abdominal aneurysm, abdominal pain, chills/fever, night sweats, nausea, vomiting, unrelenting pain): Negative   Precautions: Superficial venous thrombosis of the greater saphenous vein in RLE   Weight Bearing Restrictions: No   Living Environment Lives with: lives with their spouse, nephew and his brother lives close by Lives in: House/apartment, stairs to get in home     Patient Goals: Resolved pain and "puffiness" in knee      PRECAUTIONS: Superficial venous thrombosis of the greater saphenous vein in RLE, Hx of transverse myelitis   OBJECTIVE: (objective measures completed at initial evaluation unless otherwise dated)  Patient Surveys  FOTO 45, predicted score of 54     Cognition Patient is oriented to person, place, and time.  Recent memory is intact.  Remote memory is intact.  Attention span and concentration are intact.  Expressive speech is intact.  Patient's fund of knowledge is within normal limits for educational level.                            Gross Musculoskeletal Assessment Tremor: None Bulk: Normal Tone: Normal Pt presents with significant L knee genu valgum, L ankle pronation      GAIT: Distance walked: 50 ft Assistive device utilized: Single point cane Level of assistance: SBA Comments: Mild dynamic valgus LLE, overpronation      AROM       AROM (Normal range in degrees) AROM  10/08/2022  Lumbar    Flexion (65) WNL  Extension (30) WNL (pain in back)  Right lateral flexion (25) WNL  Left lateral flexion (25) WNL  Right  rotation (30)    Left rotation (30)                  Knee      Flexion (135) WNL 125  Extension (0) +10 +8         Ankle      Dorsiflexion (20)  WNL WNL  Plantarflexion (50)      Inversion (35)      Eversion (15      (* = pain; Blank rows = not tested)     LE MMT:   MMT (out of 5) Right 10/08/2022 Left 10/08/2022 Right 11/07/22 Left 11/07/22  Hip flexion _0 Hip extension 4+ 4- (cramp) 4+ 4+  Hip abduction 4+ 4+ 4+ 4+  Hip adduction        Hip internal rotation        Hip external rotation        Knee flexion _1 Knee extension 5 4+ 5 5  Ankle dorsiflexion        Ankle plantarflexion        Ankle inversion        Ankle  eversion        (* = pain; Blank rows = not tested)     Sensation Mild sensory loss in great toes to light touch bilateral LEs as determined by testing dermatomes L2-S2. Proprioception, and hot/cold testing deferred on this date.       Muscle Length Hamstrings: R: Not examined L: 10 deg lacking in 90/90 Quadriceps Pat Patrick): R: Positive L: Positive Hip flexors Marcello Moores): R: Not done L: Not done IT band Nicoletta Dress): R: Not done L: Not done Gastrocnemius: R: Normal  L: Normal  (10/09/22)   Palpation   Location LEFT  RIGHT           Quadriceps 0 0  Medial Hamstrings 0 0  Lateral Hamstrings 0 0  Lateral Hamstring tendon 0 0  Medial Hamstring tendon 0 0  Quadriceps tendon      Patella 0 0  Patellar Tendon      Tibial Tuberosity      Medial joint line 0 0  Lateral joint line 0 0  MCL 0 0  LCL 0 0  Adductor Tubercle      Pes Anserine tendon      Infrapatellar fat pad      Fibular head   0  Popliteal fossa   1 (mild)  (Blank rows = not tested) Graded on 0-4 scale (0 = no pain, 1 = pain, 2 = pain with wincing/grimacing/flinching, 3 = pain with withdrawal, 4 = unwilling to allow palpation), (Blank rows = not tested)     Passive Accessory Motion   Patellofemoral: Superior Glide: R: Positive for moderate restriction L: Positive for  moderate restriction Inferior Glide: R: Positive for moderate restriction L: Positive for moderate restriction Medial Glide: R: Negative L: Negative Lateral Glide: R: Negative L: Negative        SPECIAL TESTS   Lumbar Radiculopathy SLR: R: Negative, L: Negative SLUMP: R: Negative, : Negative Repeated flexion: mild discomfort in back during, no reproduction of concordant popliteal fossa pain    Meniscus Tests McMurray's Test:  Medial Meniscus (Tibial ER): R: Negative L: Negative Lateral Meniscus (Tibial IR): R: Negative L: Negative (-) meniscal signs per clinical prediction rule       FUNCTIONAL TASKS   Pt able to readily perform sit to stand with no upper extremity support         TODAY'S TREATMENT   SUBJECTIVE: Pt reports no significant pain this AM. Pt reports recovering from back pain after assembling inflatable bouncy house/slide for his kids. Patient reports his back is not as bothersome presently. Patient reports no numbness or paresthesias. Patient reports no disturbed sleep/nocturnal pain. Patient reports doing okay after additional closed-chain work this past Thursday. Pt carries in [facility] today, but it is not using it for gait when walking into the gym.   PAIN:  Are you having pain? No complaints at arrival Pain location: L posterior knee/popliteal region     Manual Therapy - for symptom modulation, soft tissue sensitivity and mobility, joint mobility, ROM   L knee PROM with overpressure at end-range reproduces no symptoms Pt exhibits normal patellar mobility  L fibular head mobilization A-P, gr III for joint mobility; 2x30 sec bouts  STM along L hamstrings and gastrocnemius in prone lying; x 8 minutes  *not today* L Tibiofemoral joint mobilization gr I-II A-P, for pain control 2x30 sec bouts    Therapeutic Exercise - for improved soft tissue flexibility and extensibility as needed for ROM, improved strength as needed to improve  performance of CKC  activities/functional movements   NuStep, x 5 minutes; Level 5, for soft tissue extensibility/mobility  -subjective information gathered during this time Dynamic hamstrings stretch (sciatic nerve floss technique); 2x10 Sidelying hip abduction; 2x10, 2-lb;  Sit to stand with Blue Tband around distal thighs to improve dynamic valgus of bilat LE; 2x10 Forward step-up; 2x10, 6-inch step  TRX reverse lunge; partial lunge 0-45 deg; 1x10 alternating R/L 3-way hip; with 3-lb ankle weights; x8 ea dir, bilat   *not today* Minisquat 0-30 deg; 2x10, therapist guarding posteriorly Straight leg raise; 2x8 with 3-lb ankle weight - verbal cueing for maintaining quad contraction and eccentric control Total Gym double-limb squat, Level 22; depth to tolerance; 2x10 Glute bridge; 2x10. Blue Tband above knees  Seated knee extension; 7.5 lbs 1x10, 10 lbs 1x10 Seated hamstring stretch; 2x30sec - for review Prone Hip extension 2x10 alternating  Standing calf stretch, lunge with upper limb support on treadmill; 2x30 sec  -focused on LLE in back for L calf flexibility, pt has onset of L knee pain with LLE in front Cold pack (unbilled) - for anti-inflammatory and analgesic effect as needed for reduced pain and improved ability to participate in active PT intervention, along L knee in supine with pillow behind L knee and calf, x 5 minutes  -utilized post-treatment today         PATIENT EDUCATION:  Education details: Exercise technique as needed for carryover to HEP Person educated: Patient Education method: Explanation Education comprehension: verbalized understanding     HOME EXERCISE PROGRAM: Access Code: [email or web address] URL: [email or web address] Date: 10/15/2022 Prepared by: Valentina Gu  Exercises - Seated Hamstring Stretch  - 2 x daily - 7 x weekly - 2 sets - 30sec hold - Gastroc Stretch on Wall  - 2 x daily - 7 x weekly - 3 sets - 30sec hold - Supine Active Straight Leg Raise  -  2 x daily - 7 x weekly - 2 sets - 10 reps     ASSESSMENT:   CLINICAL IMPRESSION: Patient has minimal pain at this time and has minimal L knee edema. There is still apparent protrusion in popliteal space consistent with Baker's cyst, but popliteal region is not notably sensitive and patient has minimal tenderness along hamstring/calf mm. He is able to progress with closed-chain work; he is challenged with reverse lunge variation, but pt denies notable crepitus or pain today. Will f/u next visit on any soreness or discomfort post-exercise; no significant acute changes today. Pt has comorbid back pain; discussed activity modification today and possibility for new referral for low back if this is re-occurring issue for the pt. Pt has remaining deficits in L gluteal strength, mild hamstring flexibility deficit, medial hamstring and calf sensitivity on L, sensory/proprioception impairment of lower limbs. Patient will benefit from continued skilled PT to address above impairments and improve overall function and QoL.    REHAB POTENTIAL: Good   CLINICAL DECISION MAKING: Evolving/moderate complexity   EVALUATION COMPLEXITY: Moderate     GOALS:   SHORT TERM GOALS: Target date: 10/31/2022   Pt will be independent with HEP to improve strength and decrease knee pain to improve pain-free function at home and work. Baseline: 10/08/22: Discussed stretching for home program, formal HEP to be given on visit # 2.  10/10/22: Formal HEP initiated.   11/07/22: Pt is compliant with HEP.  Goal status: ACHIEVED     LONG TERM GOALS: Target date: 11/21/2022   Pt will increase FOTO to at least 63 to demonstrate  significant improvement in function at home and work related to knee pain  Baseline: 10/08/22: 45.   11/07/22:  49. Goal status: IN PROGRESS    2.  Pt will decrease worst knee pain by at least 3 points on the NPRS in order to demonstrate clinically significant reduction in back pain. Baseline: 10/08/22: Knee  pain 9/10 at worst.   11/07/22: Pain up to 1-2/10 this past week.  Goal status: ACHIEVED   3.  Pt will increase strength of L hip and knee musculature measured at initial eval by at least 1/2 MMT grade in order to demonstrate improvement in strength and function as needed for improved ability to perform stair climbing and squatting Baseline: 10/08/22: Significant pain with repetitive stair climbing, squatting/bending down, and pivoting.    11/07/22: Improved hip flexor, hip extensor, and quad strength  Goal status: IN PROGRESS/ON-GOING   4.  Pt will tolerate standing activity up to 1 hour without exacerbation of knee pain as needed for completing community outings and errands in town Baseline: 10/08/22: Pt tolerate standing up to 20-30 minutes per self-report.     11/07/22: Standing up to 15 minutes Goal status: NOT MET    5.  Pt will perform 3-way lunge without loss of frontal plane control and no LOB or knee buckling with no exacerbation of knee pain indicative of improved ability to complete multi-planar movements with weightbearing on LLE and improved tolerance of pivoting as needed for functional mobility Baseline: 10/08/22: Significant pain with pivoting and turning when weightbearing on LLE.      11/07/22: Attempted today with pain primarily with forward lunging Goal status: NOT MET     PLAN: PT FREQUENCY: 2x/week   PT DURATION: 6 weeks   PLANNED INTERVENTIONS: Therapeutic exercises, Therapeutic activity, Neuromuscular re-education, Balance training, Gait training, Patient/Family education, Electrical stimulation, Cryotherapy, Moist heat, Taping, and Manual therapy   PLAN FOR NEXT SESSION: STM/IASTM to improve soft tissue mobility and sensitivity along posterior thigh. Progress with quadriceps and hip abductor/extensor/flexor strengthening. Progress to closed-chain movements as tolerated as weightbearing activity tolerance improves.      Valentina Gu, PT, DPT #[id number]  Eilleen Kempf, PT 11/19/2022, 9:12 AM

## 2022-11-26 ENCOUNTER — Encounter: Payer: Self-pay | Admitting: Physical Therapy

## 2022-11-26 ENCOUNTER — Ambulatory Visit: Payer: Medicare Other | Admitting: Physical Therapy

## 2022-11-26 DIAGNOSIS — G8929 Other chronic pain: Secondary | ICD-10-CM

## 2022-11-26 DIAGNOSIS — R262 Difficulty in walking, not elsewhere classified: Secondary | ICD-10-CM

## 2022-11-26 DIAGNOSIS — M6281 Muscle weakness (generalized): Secondary | ICD-10-CM

## 2022-11-26 DIAGNOSIS — M25562 Pain in left knee: Secondary | ICD-10-CM | POA: Diagnosis not present

## 2022-11-26 NOTE — Therapy (Signed)
OUTPATIENT PHYSICAL THERAPY TREATMENT  Patient Name: William Lynn MRN: [id number] DOB:[birth year], 61 y.o., male Today's Date: 11/26/2022  PCP: [facility] REFERRING PROVIDER: Darrol Jump, MD  END OF SESSION:   PT End of Session - 11/26/22 1018     Visit Number 14    Number of Visits 18    Date for PT Re-Evaluation 01/05/23    Authorization Type eval 10/08/22    PT Start Time 0813    PT Stop Time 0904    PT Time Calculation (min) 51 min    Equipment Utilized During Treatment --   pt utlizies personal SPC for gait intermittently during episodes of heightened pain   Activity Tolerance Patient tolerated treatment well    Behavior During Therapy WFL for tasks assessed/performed                   Past Medical History:  Diagnosis Date   GERD (gastroesophageal reflux disease)    Neuropathy    Sarcoidosis    Past Surgical History:  Procedure Laterality Date   HERNIA REPAIR     KNEE SURGERY Right    SHOULDER SURGERY Bilateral    Patient Active Problem List   Diagnosis Date Noted   TIA (transient ischemic attack) 10/13/2021   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   On corticosteroid therapy 06/21/2018   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   Gait abnormality 02/25/2018   Low serum thyroid stimulating hormone (TSH) 02/25/2018   Serum gammaglobulin increased 02/25/2018   Muscle weakness (generalized) 06/07/2014   Closed fracture of glenoid cavity and neck of scapula 06/03/2014   S/P arthroscopy of shoulder 06/03/2014   Stiffness of joint, not elsewhere classified,  shoulder region 06/03/2014    REFERRING DIAG:  M25.562 (ICD-10-CM) - Pain in left knee  G89.29 (ICD-10-CM) - Other chronic pain  R29.898 (ICD-10-CM) - Other symptoms and signs involving the musculoskeletal system  M71.22 (ICD-10-CM) - Synovial cyst of popliteal space (Baker), left knee  M76.892 (ICD-10-CM) - Other specified enthesopathies of left lower limb, excluding foot    THERAPY  DIAG:  Chronic pain of left knee  Difficulty in walking, not elsewhere classified  Muscle weakness (generalized)  Rationale for Evaluation and Treatment Rehabilitation  PERTINENT HISTORY: Patient is a 61 year old male referred for L knee pain with Hx of R knee surgery and hx of TIA associated with hypertension. Patient has Baker's cyst L knee. Hx of L ACL tear with reconstruction about 30 years ago.  Patient reports having blood clot in R calf confirmed with Doppler (superficial venous thrombosis in greater saphenous vein). Pt is using compression stockings at this time.  Pt has pain in posterior knee/popliteal fossa. Patient reports having "a little back pain." Patient is unsure of back pain happens with posterior knee pain. Patient reports disturbed sleep from leg cramps. Patient reports atraumatic onset of pain.    Pain:  Pain Intensity: Present: 0/10, Best: 0/10, Worst: 9/10 Pain location: Posterior knee/popliteal fossa Pain quality: previously sharp, feeling of "puffiness" in posterior knee/popliteal region  Radiating pain: No  Swelling: Yes , posterior knee  Popping, catching, locking: Yes , catching with leg rotating in closed-chain position  Numbness/Tingling: Yes, hx of long-standing numbness/tingling in toes from peripheral neuropathy  Focal weakness or buckling: No Aggravating factors: twisting leg in closed-chain flexion, standing, turning/pivoting, repetitive stair climbing or prolonged walking around house, squatting/bending down  Relieving factors: sitting, non-weightbearing 24-hour pain behavior: continuous pain throughout day  How long can you stand: 20-30 minutes History of prior  back, hip, or knee injury, pain, surgery, or therapy: Yes, hx of L ACL reconstruction 30 years, hx of back pain  Falls: Has patient fallen in last 6 months? No, Number of falls: N/A Follow-up appointment with MD: F/u with MD after PT Imaging: Yes , Ultrasound R LE   IMPRESSION 1. No evidence  of deep venous thrombosis in the RIGHT lower extremity. 2. Superficial venous thrombosis of the greater saphenous vein.   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[employer], difficulty with yard work/cutting grass c push mower Red flags (bowel/bladder changes, saddle paresthesia, personal history of cancer, h/o spinal tumors, h/o compression fx, h/o abdominal aneurysm, abdominal pain, chills/fever, night sweats, nausea, vomiting, unrelenting pain): Negative   Precautions: Superficial venous thrombosis of the greater saphenous vein in RLE   Weight Bearing Restrictions: No   Living Environment Lives with: lives with their spouse, nephew and his brother lives close by Lives in: House/apartment, stairs to get in home     Patient Goals: Resolved pain and "puffiness" in knee      PRECAUTIONS: Superficial venous thrombosis of the greater saphenous vein in RLE, Hx of transverse myelitis   OBJECTIVE: (objective measures completed at initial evaluation unless otherwise dated)  Patient Surveys  FOTO 45, predicted score of 14     Cognition Patient is oriented to person, place, and time.  Recent memory is intact.  Remote memory is intact.  Attention span and concentration are intact.  Expressive speech is intact.  Patient's fund of knowledge is within normal limits for educational level.                            Gross Musculoskeletal Assessment Tremor: None Bulk: Normal Tone: Normal Pt presents with significant L knee genu valgum, L ankle pronation      GAIT: Distance walked: 50 ft Assistive device utilized: Single point cane Level of assistance: SBA Comments: Mild dynamic valgus LLE, overpronation      AROM       AROM (Normal range in degrees) AROM  10/08/2022  Lumbar    Flexion (65) WNL  Extension (30) WNL (pain in back)  Right lateral flexion (25) WNL  Left lateral flexion (25) WNL  Right rotation (30)    Left  rotation (30)                  Knee      Flexion (135) WNL 125  Extension (0) +10 +8         Ankle      Dorsiflexion (20)  WNL WNL  Plantarflexion (50)      Inversion (35)      Eversion (15      (* = pain; Blank rows = not tested)     LE MMT:   MMT (out of 5) Right 10/08/2022 Left 10/08/2022 Right 11/07/22 Left 11/07/22  Hip flexion _0 Hip extension 4+ 4- (cramp) 4+ 4+  Hip abduction 4+ 4+ 4+ 4+  Hip adduction        Hip internal rotation        Hip external rotation        Knee flexion _1 Knee extension 5 4+ 5 5  Ankle dorsiflexion        Ankle plantarflexion        Ankle inversion        Ankle eversion        (* =  pain; Blank rows = not tested)     Sensation Mild sensory loss in great toes to light touch bilateral LEs as determined by testing dermatomes L2-S2. Proprioception, and hot/cold testing deferred on this date.       Muscle Length Hamstrings: R: Not examined L: 10 deg lacking in 90/90 Quadriceps Pat Patrick): R: Positive L: Positive Hip flexors Marcello Moores): R: Not done L: Not done IT band Nicoletta Dress): R: Not done L: Not done Gastrocnemius: R: Normal  L: Normal  (10/09/22)   Palpation   Location LEFT  RIGHT           Quadriceps 0 0  Medial Hamstrings 0 0  Lateral Hamstrings 0 0  Lateral Hamstring tendon 0 0  Medial Hamstring tendon 0 0  Quadriceps tendon      Patella 0 0  Patellar Tendon      Tibial Tuberosity      Medial joint line 0 0  Lateral joint line 0 0  MCL 0 0  LCL 0 0  Adductor Tubercle      Pes Anserine tendon      Infrapatellar fat pad      Fibular head   0  Popliteal fossa   1 (mild)  (Blank rows = not tested) Graded on 0-4 scale (0 = no pain, 1 = pain, 2 = pain with wincing/grimacing/flinching, 3 = pain with withdrawal, 4 = unwilling to allow palpation), (Blank rows = not tested)     Passive Accessory Motion   Patellofemoral: Superior Glide: R: Positive for moderate restriction L: Positive for moderate  restriction Inferior Glide: R: Positive for moderate restriction L: Positive for moderate restriction Medial Glide: R: Negative L: Negative Lateral Glide: R: Negative L: Negative        SPECIAL TESTS   Lumbar Radiculopathy SLR: R: Negative, L: Negative SLUMP: R: Negative, : Negative Repeated flexion: mild discomfort in back during, no reproduction of concordant popliteal fossa pain    Meniscus Tests McMurray's Test:  Medial Meniscus (Tibial ER): R: Negative L: Negative Lateral Meniscus (Tibial IR): R: Negative L: Negative (-) meniscal signs per clinical prediction rule       FUNCTIONAL TASKS   Pt able to readily perform sit to stand with no upper extremity support         TODAY'S TREATMENT   SUBJECTIVE: Pt reports sharp pain in L posterior knee this AM. Patient reports no pain at arrival. Patient reports episode of his lower extremities giving way on both sides when getting up from his son's bed. Pt reports using ice/heat on knee as needed. Patient reports no major difficulties/soreness/pain immediately following last visit.   PAIN:  Are you having pain? None currently Pain location: L posterior knee/popliteal region     Manual Therapy - for symptom modulation, soft tissue sensitivity and mobility, joint mobility, ROM   L knee PROM with overpressure at end-range to check for motion loss or pain reproduction, pain reproduced with end-range knee extension   L Tibiofemoral joint mobilization gr I-II A-P, for pain control 2x30 sec bouts   STM along L hamstrings and medial gastrocnemius in prone lying; x 15 minutes  *not today* L fibular head mobilization A-P, gr III for joint mobility; 2x30 sec bouts    Therapeutic Exercise - for improved soft tissue flexibility and extensibility as needed for ROM, improved strength as needed to improve performance of CKC activities/functional movements  Seated hamstring stretch; 2x30sec   Sit to stand with Blue Tband around  distal thighs  to improve dynamic valgus of bilat LE; 2x10 Forward step-up; 2x10, 6-inch step  TRX reverse lunge; partial lunge 0-45 deg; 1x10 alternating R/L   *next visit* NuStep, x 5 minutes; Level 5, for soft tissue extensibility/mobility  -subjective information gathered during this time 3-way hip; with 3-lb ankle weights; x8 ea dir, bilat   *not today* Sidelying hip abduction; 2x10, 2-lb;  Dynamic hamstrings stretch (sciatic nerve floss technique); 2x10 Minisquat 0-30 deg; 2x10, therapist guarding posteriorly Straight leg raise; 2x8 with 3-lb ankle weight - verbal cueing for maintaining quad contraction and eccentric control [employer] double-limb squat, Level 22; depth to tolerance; 2x10 Glute bridge; 2x10. Blue Tband above knees  Seated knee extension; 7.5 lbs 1x10, 10 lbs 1x10 Prone Hip extension 2x10 alternating  Standing calf stretch, lunge with upper limb support on treadmill; 2x30 sec  -focused on LLE in back for L calf flexibility, pt has onset of L knee pain with LLE in front Cold pack (unbilled) - for anti-inflammatory and analgesic effect as needed for reduced pain and improved ability to participate in active PT intervention, along L knee in supine with pillow behind L knee and calf, x 5 minutes  -utilized post-treatment today     PATIENT EDUCATION:  Education details: Exercise technique as needed for carryover to HEP Person educated: Patient Education method: Explanation Education comprehension: verbalized understanding     HOME EXERCISE PROGRAM: Access Code: [email or web address] URL: [email or web address] Date: 11/26/2022 Prepared by: Valentina Gu  Exercises - Seated Hamstring Stretch  - 2 x daily - 7 x weekly - 2 sets - 30sec hold - Gastroc Stretch on Wall  - 2 x daily - 7 x weekly - 3 sets - 30sec hold - Supine Active Straight Leg Raise  - 2 x daily - 7 x weekly - 2 sets - 10 reps - Sidelying Hip Abduction  - 1 x daily - 7 x weekly - 2 sets - 10  reps - Supine Bridge  - 1 x daily - 7 x weekly - 2 sets - 10 reps    ASSESSMENT:   CLINICAL IMPRESSION: Patient has notable sensitivity and TTP along L medial gastrocnemius and bilateral hamstrings near musculotendinous junction close to distal insertion. Patient tolerates manual therapy well and demonstrates no gross motion loss. He has good accessory mobility of patellar and tibia/fibula. Emphasized work on manual therapy and stretching for sensitivity and tightness along L hamstrings/calf musculature. Pt tolerates closed-chain work in clinic well today without exacerbation of pain. Pt does report strange sensation in his thighs that is described as feeling of something "wrapped around leg." Will continue to monitor for any sensory change; this could be associated with Hx of transverse myelitis and altered sensory input to the CNS. Pt does not have notable DVT or blood vessel disease S&S. Pt has remaining deficits in L gluteal strength, mild hamstring flexibility deficit, medial hamstring and calf sensitivity on L, sensory/proprioception impairment of lower limbs. Patient will benefit from continued skilled PT to address above impairments and improve overall function and QoL.    REHAB POTENTIAL: Good   CLINICAL DECISION MAKING: Evolving/moderate complexity   EVALUATION COMPLEXITY: Moderate     GOALS:   SHORT TERM GOALS: Target date: 10/31/2022   Pt will be independent with HEP to improve strength and decrease knee pain to improve pain-free function at home and work. Baseline: 10/08/22: Discussed stretching for home program, formal HEP to be given on visit # 2.  10/10/22: Formal HEP initiated.   11/07/22: Pt is  compliant with HEP.  Goal status: ACHIEVED     LONG TERM GOALS: Target date: 11/21/2022   Pt will increase FOTO to at least 63 to demonstrate significant improvement in function at home and work related to knee pain  Baseline: 10/08/22: 45.   11/07/22:  49. Goal status: IN PROGRESS     2.  Pt will decrease worst knee pain by at least 3 points on the NPRS in order to demonstrate clinically significant reduction in back pain. Baseline: 10/08/22: Knee pain 9/10 at worst.   11/07/22: Pain up to 1-2/10 this past week.  Goal status: ACHIEVED   3.  Pt will increase strength of L hip and knee musculature measured at initial eval by at least 1/2 MMT grade in order to demonstrate improvement in strength and function as needed for improved ability to perform stair climbing and squatting Baseline: 10/08/22: Significant pain with repetitive stair climbing, squatting/bending down, and pivoting.    11/07/22: Improved hip flexor, hip extensor, and quad strength  Goal status: IN PROGRESS/ON-GOING   4.  Pt will tolerate standing activity up to 1 hour without exacerbation of knee pain as needed for completing community outings and errands in town Baseline: 10/08/22: Pt tolerate standing up to 20-30 minutes per self-report.     11/07/22: Standing up to 15 minutes Goal status: NOT MET    5.  Pt will perform 3-way lunge without loss of frontal plane control and no LOB or knee buckling with no exacerbation of knee pain indicative of improved ability to complete multi-planar movements with weightbearing on LLE and improved tolerance of pivoting as needed for functional mobility Baseline: 10/08/22: Significant pain with pivoting and turning when weightbearing on LLE.      11/07/22: Attempted today with pain primarily with forward lunging Goal status: NOT MET     PLAN: PT FREQUENCY: 2x/week   PT DURATION: 6 weeks   PLANNED INTERVENTIONS: Therapeutic exercises, Therapeutic activity, Neuromuscular re-education, Balance training, Gait training, Patient/Family education, Electrical stimulation, Cryotherapy, Moist heat, Taping, and Manual therapy   PLAN FOR NEXT SESSION: STM/IASTM to improve soft tissue mobility and sensitivity along posterior thigh. Progress with quadriceps and hip  abductor/extensor/flexor strengthening. Progress to closed-chain movements as tolerated as weightbearing activity tolerance improves.      Valentina Gu, PT, DPT #[id number]  Eilleen Kempf, PT 11/26/2022, 10:18 AM

## 2022-11-28 ENCOUNTER — Encounter: Payer: Self-pay | Admitting: Physical Therapy

## 2022-11-28 ENCOUNTER — Ambulatory Visit: Payer: Medicare Other | Admitting: Physical Therapy

## 2022-11-28 DIAGNOSIS — M6281 Muscle weakness (generalized): Secondary | ICD-10-CM

## 2022-11-28 DIAGNOSIS — M25562 Pain in left knee: Secondary | ICD-10-CM | POA: Diagnosis not present

## 2022-11-28 DIAGNOSIS — R262 Difficulty in walking, not elsewhere classified: Secondary | ICD-10-CM

## 2022-11-28 DIAGNOSIS — G8929 Other chronic pain: Secondary | ICD-10-CM

## 2022-11-28 DIAGNOSIS — R2681 Unsteadiness on feet: Secondary | ICD-10-CM

## 2022-11-28 NOTE — Therapy (Addendum)
OUTPATIENT PHYSICAL THERAPY TREATMENT  Patient Name: William Lynn MRN: [id number] DOB:07-Nov-1961, 61 y.o., male Today's Date: 11/28/2022  PCP: [facility] REFERRING PROVIDER: Darrol Jump, MD  END OF SESSION:   PT End of Session - 11/28/22 0818     Visit Number 15    Number of Visits 18    Date for PT Re-Evaluation 01/05/23    Authorization Type eval 10/08/22    PT Start Time 0815    PT Stop Time 0901    PT Time Calculation (min) 46 min    Equipment Utilized During Treatment --   pt utlizies personal SPC for gait intermittently during episodes of heightened pain   Activity Tolerance Patient tolerated treatment well    Behavior During Therapy WFL for tasks assessed/performed             Past Medical History:  Diagnosis Date   GERD (gastroesophageal reflux disease)    Neuropathy    Sarcoidosis    Past Surgical History:  Procedure Laterality Date   HERNIA REPAIR     KNEE SURGERY Right    SHOULDER SURGERY Bilateral    Patient Active Problem List   Diagnosis Date Noted   TIA (transient ischemic attack) 10/13/2021   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   On corticosteroid therapy 06/21/2018   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   Gait abnormality 02/25/2018   Low serum thyroid stimulating hormone (TSH) 02/25/2018   Serum gammaglobulin increased 02/25/2018   Muscle weakness (generalized) 06/07/2014   Closed fracture of glenoid cavity and neck of scapula 06/03/2014   S/P arthroscopy of shoulder 06/03/2014   Stiffness of joint, not elsewhere classified,  shoulder region 06/03/2014    REFERRING DIAG:  M25.562 (ICD-10-CM) - Pain in left knee  G89.29 (ICD-10-CM) - Other chronic pain  R29.898 (ICD-10-CM) - Other symptoms and signs involving the musculoskeletal system  M71.22 (ICD-10-CM) - Synovial cyst of popliteal space (Baker), left knee  M76.892 (ICD-10-CM) - Other specified enthesopathies of left lower limb, excluding foot    THERAPY DIAG:   Chronic pain of left knee  Difficulty in walking, not elsewhere classified  Muscle weakness (generalized)  Unsteadiness on feet  Rationale for Evaluation and Treatment Rehabilitation  PERTINENT HISTORY: Patient is a 61 year old male referred for L knee pain with Hx of R knee surgery and hx of TIA associated with hypertension. Patient has Baker's cyst L knee. Hx of L ACL tear with reconstruction about 30 years ago.  Patient reports having blood clot in R calf confirmed with Doppler (superficial venous thrombosis in greater saphenous vein). Pt is using compression stockings at this time.  Pt has pain in posterior knee/popliteal fossa. Patient reports having "a little back pain." Patient is unsure of back pain happens with posterior knee pain. Patient reports disturbed sleep from leg cramps. Patient reports atraumatic onset of pain.    Pain:  Pain Intensity: Present: 0/10, Best: 0/10, Worst: 9/10 Pain location: Posterior knee/popliteal fossa Pain quality: previously sharp, feeling of "puffiness" in posterior knee/popliteal region  Radiating pain: No  Swelling: Yes , posterior knee  Popping, catching, locking: Yes , catching with leg rotating in closed-chain position  Numbness/Tingling: Yes, hx of long-standing numbness/tingling in toes from peripheral neuropathy  Focal weakness or buckling: No Aggravating factors: twisting leg in closed-chain flexion, standing, turning/pivoting, repetitive stair climbing or prolonged walking around house, squatting/bending down  Relieving factors: sitting, non-weightbearing 24-hour pain behavior: continuous pain throughout day  How long can you stand: 20-30 minutes History of prior back, hip,  or knee injury, pain, surgery, or therapy: Yes, hx of L ACL reconstruction 30 years, hx of back pain  Falls: Has patient fallen in last 6 months? No, Number of falls: N/A Follow-up appointment with MD: F/u with MD after PT Imaging: Yes , Ultrasound R LE    IMPRESSION 1. No evidence of deep venous thrombosis in the RIGHT lower extremity. 2. Superficial venous thrombosis of the greater saphenous vein.   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[employer], difficulty with yard work/cutting grass c push mower Red flags (bowel/bladder changes, saddle paresthesia, personal history of cancer, h/o spinal tumors, h/o compression fx, h/o abdominal aneurysm, abdominal pain, chills/fever, night sweats, nausea, vomiting, unrelenting pain): Negative   Precautions: Superficial venous thrombosis of the greater saphenous vein in RLE   Weight Bearing Restrictions: No   Living Environment Lives with: lives with their spouse, nephew and his brother lives close by Lives in: House/apartment, stairs to get in home     Patient Goals: Resolved pain and "puffiness" in knee      PRECAUTIONS: Superficial venous thrombosis of the greater saphenous vein in RLE, Hx of transverse myelitis   OBJECTIVE: (objective measures completed at initial evaluation unless otherwise dated)  Patient Surveys  FOTO 45, predicted score of 66     Cognition Patient is oriented to person, place, and time.  Recent memory is intact.  Remote memory is intact.  Attention span and concentration are intact.  Expressive speech is intact.  Patient's fund of knowledge is within normal limits for educational level.                            Gross Musculoskeletal Assessment Tremor: None Bulk: Normal Tone: Normal Pt presents with significant L knee genu valgum, L ankle pronation      GAIT: Distance walked: 50 ft Assistive device utilized: Single point cane Level of assistance: SBA Comments: Mild dynamic valgus LLE, overpronation      AROM       AROM (Normal range in degrees) AROM  10/08/2022  Lumbar    Flexion (65) WNL  Extension (30) WNL (pain in back)  Right lateral flexion (25) WNL  Left lateral flexion (25) WNL  Right  rotation (30)    Left rotation (30)                  Knee      Flexion (135) WNL 125  Extension (0) +10 +8         Ankle      Dorsiflexion (20)  WNL WNL  Plantarflexion (50)      Inversion (35)      Eversion (15      (* = pain; Blank rows = not tested)     LE MMT:   MMT (out of 5) Right 10/08/2022 Left 10/08/2022 Right 11/07/22 Left 11/07/22  Hip flexion _0 Hip extension 4+ 4- (cramp) 4+ 4+  Hip abduction 4+ 4+ 4+ 4+  Hip adduction        Hip internal rotation        Hip external rotation        Knee flexion _1 Knee extension 5 4+ 5 5  Ankle dorsiflexion        Ankle plantarflexion        Ankle inversion        Ankle eversion        (* =  pain; Blank rows = not tested)     Sensation Mild sensory loss in great toes to light touch bilateral LEs as determined by testing dermatomes L2-S2. Proprioception, and hot/cold testing deferred on this date.       Muscle Length Hamstrings: R: Not examined L: 10 deg lacking in 90/90 Quadriceps Pat Patrick): R: Positive L: Positive Hip flexors Marcello Moores): R: Not done L: Not done IT band Nicoletta Dress): R: Not done L: Not done Gastrocnemius: R: Normal  L: Normal  (10/09/22)   Palpation   Location LEFT  RIGHT           Quadriceps 0 0  Medial Hamstrings 0 0  Lateral Hamstrings 0 0  Lateral Hamstring tendon 0 0  Medial Hamstring tendon 0 0  Quadriceps tendon      Patella 0 0  Patellar Tendon      Tibial Tuberosity      Medial joint line 0 0  Lateral joint line 0 0  MCL 0 0  LCL 0 0  Adductor Tubercle      Pes Anserine tendon      Infrapatellar fat pad      Fibular head   0  Popliteal fossa   1 (mild)  (Blank rows = not tested) Graded on 0-4 scale (0 = no pain, 1 = pain, 2 = pain with wincing/grimacing/flinching, 3 = pain with withdrawal, 4 = unwilling to allow palpation), (Blank rows = not tested)     Passive Accessory Motion   Patellofemoral: Superior Glide: R: Positive for moderate restriction L: Positive for  moderate restriction Inferior Glide: R: Positive for moderate restriction L: Positive for moderate restriction Medial Glide: R: Negative L: Negative Lateral Glide: R: Negative L: Negative        SPECIAL TESTS   Lumbar Radiculopathy SLR: R: Negative, L: Negative SLUMP: R: Negative, : Negative Repeated flexion: mild discomfort in back during, no reproduction of concordant popliteal fossa pain    Meniscus Tests McMurray's Test:  Medial Meniscus (Tibial ER): R: Negative L: Negative Lateral Meniscus (Tibial IR): R: Negative L: Negative (-) meniscal signs per clinical prediction rule       FUNCTIONAL TASKS   Pt able to readily perform sit to stand with no upper extremity support         TODAY'S TREATMENT   SUBJECTIVE: Pt reports posterior knee pain during warm-up, popliteal space region. Patient reports he is doing well generally with functional mobility over the last few days. He reports pain with terminal knee extension when he is lying on his stomach. Patient reports compliance with his HEP.   PAIN:  Are you having pain? 2/10 pain at arrival to PT Pain location: L posterior knee/popliteal region     Manual Therapy - for symptom modulation, soft tissue sensitivity and mobility, joint mobility, ROM   L knee PROM with overpressure at end-range to check for motion loss or pain reproduction, pain reproduced with end-range knee extension   L Tibiofemoral joint mobilization gr I-II A-P, for pain control 2x30 sec bouts   STM along L hamstrings and medial gastrocnemius in prone lying; x 8 minutes  *not today* L fibular head mobilization A-P, gr III for joint mobility; 2x30 sec bouts    Therapeutic Exercise - for improved soft tissue flexibility and extensibility as needed for ROM, improved strength as needed to improve performance of CKC activities/functional movements  NuStep, x 5 minutes; Level 4, for soft tissue extensibility/mobility  -subjective information gathered  during this time  Seated hamstring stretch; 3x30sec  Squat to table with Blue Tband around distal thighs to control for dynamic valgus of bilat LE; 2x8 Forward step-up; 2x10, 6-inch step  TRX reverse lunge; partial lunge 0-45 deg; 1x10 alternating R/L  3-way hip; with 4-lb ankle weights; x8 ea dir, bilat   *not today* Sidelying hip abduction; 2x10, 2-lb;  Dynamic hamstrings stretch (sciatic nerve floss technique); 2x10 Minisquat 0-30 deg; 2x10, therapist guarding posteriorly Straight leg raise; 2x8 with 3-lb ankle weight - verbal cueing for maintaining quad contraction and eccentric control [employer] double-limb squat, Level 22; depth to tolerance; 2x10 Glute bridge; 2x10. Blue Tband above knees  Seated knee extension; 7.5 lbs 1x10, 10 lbs 1x10 Prone Hip extension 2x10 alternating  Standing calf stretch, lunge with upper limb support on treadmill; 2x30 sec  -focused on LLE in back for L calf flexibility, pt has onset of L knee pain with LLE in front Cold pack (unbilled) - for anti-inflammatory and analgesic effect as needed for reduced pain and improved ability to participate in active PT intervention, along L knee in supine with pillow behind L knee and calf, x 5 minutes  -utilized post-treatment today     PATIENT EDUCATION:  Education details: Exercise technique as needed for carryover to HEP Person educated: Patient Education method: Explanation Education comprehension: verbalized understanding     HOME EXERCISE PROGRAM: Access Code: [email or web address] URL: [email or web address] Date: 11/26/2022 Prepared by: Valentina Gu  Exercises - Seated Hamstring Stretch  - 2 x daily - 7 x weekly - 2 sets - 30sec hold - Gastroc Stretch on Wall  - 2 x daily - 7 x weekly - 3 sets - 30sec hold - Supine Active Straight Leg Raise  - 2 x daily - 7 x weekly - 2 sets - 10 reps - Sidelying Hip Abduction  - 1 x daily - 7 x weekly - 2 sets - 10 reps - Supine Bridge  - 1 x daily - 7 x  weekly - 2 sets - 10 reps    ASSESSMENT:   CLINICAL IMPRESSION: Patient has made good progress to date with improved NPRS and significantly improved tolerance of closed-chain drills. Patient does have pain with end-range knee extension; he has minimal sensitivity remaining along popliteal space and hamstrings/calf mm. Pt does have remaining positional intolerance for prolonged standing likely associated with weakness stemming from transverse myelitis. Pt has remaining deficits in L gluteal strength, mild hamstring flexibility deficit, medial hamstring and calf sensitivity on L, sensory/proprioception impairment of lower limbs. Patient will benefit from continued skilled PT to address above impairments and improve overall function and QoL.    REHAB POTENTIAL: Good   CLINICAL DECISION MAKING: Evolving/moderate complexity   EVALUATION COMPLEXITY: Moderate     GOALS:   SHORT TERM GOALS: Target date: 10/31/2022   Pt will be independent with HEP to improve strength and decrease knee pain to improve pain-free function at home and work. Baseline: 10/08/22: Discussed stretching for home program, formal HEP to be given on visit # 2.  10/10/22: Formal HEP initiated.   11/07/22: Pt is compliant with HEP.  Goal status: ACHIEVED     LONG TERM GOALS: Target date: 11/21/2022   Pt will increase FOTO to at least 63 to demonstrate significant improvement in function at home and work related to knee pain  Baseline: 10/08/22: 45.   11/07/22:  49. Goal status: IN PROGRESS    2.  Pt will decrease worst knee pain by at least 3 points on the  NPRS in order to demonstrate clinically significant reduction in back pain. Baseline: 10/08/22: Knee pain 9/10 at worst.   11/07/22: Pain up to 1-2/10 this past week.  Goal status: ACHIEVED   3.  Pt will increase strength of L hip and knee musculature measured at initial eval by at least 1/2 MMT grade in order to demonstrate improvement in strength and function as needed  for improved ability to perform stair climbing and squatting Baseline: 10/08/22: Significant pain with repetitive stair climbing, squatting/bending down, and pivoting.    11/07/22: Improved hip flexor, hip extensor, and quad strength  Goal status: IN PROGRESS/ON-GOING   4.  Pt will tolerate standing activity up to 1 hour without exacerbation of knee pain as needed for completing community outings and errands in town Baseline: 10/08/22: Pt tolerate standing up to 20-30 minutes per self-report.     11/07/22: Standing up to 15 minutes Goal status: NOT MET    5.  Pt will perform 3-way lunge without loss of frontal plane control and no LOB or knee buckling with no exacerbation of knee pain indicative of improved ability to complete multi-planar movements with weightbearing on LLE and improved tolerance of pivoting as needed for functional mobility Baseline: 10/08/22: Significant pain with pivoting and turning when weightbearing on LLE.      11/07/22: Attempted today with pain primarily with forward lunging Goal status: NOT MET     PLAN: PT FREQUENCY: 2x/week   PT DURATION: 6 weeks   PLANNED INTERVENTIONS: Therapeutic exercises, Therapeutic activity, Neuromuscular re-education, Balance training, Gait training, Patient/Family education, Electrical stimulation, Cryotherapy, Moist heat, Taping, and Manual therapy   PLAN FOR NEXT SESSION: STM/IASTM to improve soft tissue mobility and sensitivity along posterior thigh. Progress with quadriceps and hip abductor/extensor/flexor strengthening. Progress to closed-chain movements as tolerated as weightbearing activity tolerance improves.      Valentina Gu, PT, DPT #[id number]  Eilleen Kempf, PT 11/28/2022, 10:07 PM

## 2022-12-03 ENCOUNTER — Encounter: Payer: Self-pay | Admitting: Physical Therapy

## 2022-12-03 ENCOUNTER — Ambulatory Visit: Payer: Medicare Other | Attending: Internal Medicine | Admitting: Physical Therapy

## 2022-12-03 DIAGNOSIS — R262 Difficulty in walking, not elsewhere classified: Secondary | ICD-10-CM | POA: Insufficient documentation

## 2022-12-03 DIAGNOSIS — G8929 Other chronic pain: Secondary | ICD-10-CM | POA: Diagnosis present

## 2022-12-03 DIAGNOSIS — M25562 Pain in left knee: Secondary | ICD-10-CM | POA: Insufficient documentation

## 2022-12-03 DIAGNOSIS — M6281 Muscle weakness (generalized): Secondary | ICD-10-CM | POA: Insufficient documentation

## 2022-12-03 NOTE — Therapy (Unsigned)
OUTPATIENT PHYSICAL THERAPY TREATMENT  Patient Name: William Lynn MRN: [id number] DOB:Oct 12, 1961, 61 y.o., male Today's Date: 12/04/2022  PCP: [facility] REFERRING PROVIDER: Darrol Jump, MD  END OF SESSION:   PT End of Session - 12/04/22 2025     Visit Number 16    Number of Visits 18    Date for PT Re-Evaluation 01/05/23    Authorization Type eval 10/08/22    PT Start Time 0820    PT Stop Time 0903    PT Time Calculation (min) 43 min    Equipment Utilized During Treatment --   pt utlizies personal SPC for gait intermittently during episodes of heightened pain   Activity Tolerance Patient tolerated treatment well    Behavior During Therapy WFL for tasks assessed/performed              Past Medical History:  Diagnosis Date   GERD (gastroesophageal reflux disease)    Neuropathy    Sarcoidosis    Past Surgical History:  Procedure Laterality Date   HERNIA REPAIR     KNEE SURGERY Right    SHOULDER SURGERY Bilateral    Patient Active Problem List   Diagnosis Date Noted   TIA (transient ischemic attack) 10/13/2021   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   On corticosteroid therapy 06/21/2018   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   Gait abnormality 02/25/2018   Low serum thyroid stimulating hormone (TSH) 02/25/2018   Serum gammaglobulin increased 02/25/2018   Muscle weakness (generalized) 06/07/2014   Closed fracture of glenoid cavity and neck of scapula 06/03/2014   S/P arthroscopy of shoulder 06/03/2014   Stiffness of joint, not elsewhere classified,  shoulder region 06/03/2014    REFERRING DIAG:  M25.562 (ICD-10-CM) - Pain in left knee  G89.29 (ICD-10-CM) - Other chronic pain  R29.898 (ICD-10-CM) - Other symptoms and signs involving the musculoskeletal system  M71.22 (ICD-10-CM) - Synovial cyst of popliteal space (Baker), left knee  M76.892 (ICD-10-CM) - Other specified enthesopathies of left lower limb, excluding foot    THERAPY DIAG:   Chronic pain of left knee  Difficulty in walking, not elsewhere classified  Muscle weakness (generalized)  Rationale for Evaluation and Treatment Rehabilitation  PERTINENT HISTORY: Patient is a 61 year old male referred for L knee pain with Hx of R knee surgery and hx of TIA associated with hypertension. Patient has Baker's cyst L knee. Hx of L ACL tear with reconstruction about 30 years ago.  Patient reports having blood clot in R calf confirmed with Doppler (superficial venous thrombosis in greater saphenous vein). Pt is using compression stockings at this time.  Pt has pain in posterior knee/popliteal fossa. Patient reports having "a little back pain." Patient is unsure of back pain happens with posterior knee pain. Patient reports disturbed sleep from leg cramps. Patient reports atraumatic onset of pain.    Pain:  Pain Intensity: Present: 0/10, Best: 0/10, Worst: 9/10 Pain location: Posterior knee/popliteal fossa Pain quality: previously sharp, feeling of "puffiness" in posterior knee/popliteal region  Radiating pain: No  Swelling: Yes , posterior knee  Popping, catching, locking: Yes , catching with leg rotating in closed-chain position  Numbness/Tingling: Yes, hx of long-standing numbness/tingling in toes from peripheral neuropathy  Focal weakness or buckling: No Aggravating factors: twisting leg in closed-chain flexion, standing, turning/pivoting, repetitive stair climbing or prolonged walking around house, squatting/bending down  Relieving factors: sitting, non-weightbearing 24-hour pain behavior: continuous pain throughout day  How long can you stand: 20-30 minutes History of prior back, hip, or knee injury,  pain, surgery, or therapy: Yes, hx of L ACL reconstruction 30 years, hx of back pain  Falls: Has patient fallen in last 6 months? No, Number of falls: N/A Follow-up appointment with MD: F/u with MD after PT Imaging: Yes , Ultrasound R LE   IMPRESSION 1. No evidence of  deep venous thrombosis in the RIGHT lower extremity. 2. Superficial venous thrombosis of the greater saphenous vein.   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[employer], difficulty with yard work/cutting grass c push mower Red flags (bowel/bladder changes, saddle paresthesia, personal history of cancer, h/o spinal tumors, h/o compression fx, h/o abdominal aneurysm, abdominal pain, chills/fever, night sweats, nausea, vomiting, unrelenting pain): Negative   Precautions: Superficial venous thrombosis of the greater saphenous vein in RLE   Weight Bearing Restrictions: No   Living Environment Lives with: lives with their spouse, nephew and his brother lives close by Lives in: House/apartment, stairs to get in home     Patient Goals: Resolved pain and "puffiness" in knee      PRECAUTIONS: Superficial venous thrombosis of the greater saphenous vein in RLE, Hx of transverse myelitis   OBJECTIVE: (objective measures completed at initial evaluation unless otherwise dated)  Patient Surveys  FOTO 45, predicted score of 33     Cognition Patient is oriented to person, place, and time.  Recent memory is intact.  Remote memory is intact.  Attention span and concentration are intact.  Expressive speech is intact.  Patient's fund of knowledge is within normal limits for educational level.                            Gross Musculoskeletal Assessment Tremor: None Bulk: Normal Tone: Normal Pt presents with significant L knee genu valgum, L ankle pronation      GAIT: Distance walked: 50 ft Assistive device utilized: Single point cane Level of assistance: SBA Comments: Mild dynamic valgus LLE, overpronation      AROM       AROM (Normal range in degrees) AROM  10/08/2022  Lumbar    Flexion (65) WNL  Extension (30) WNL (pain in back)  Right lateral flexion (25) WNL  Left lateral flexion (25) WNL  Right rotation (30)    Left rotation  (30)                  Knee      Flexion (135) WNL 125  Extension (0) +10 +8         Ankle      Dorsiflexion (20)  WNL WNL  Plantarflexion (50)      Inversion (35)      Eversion (15      (* = pain; Blank rows = not tested)     LE MMT:   MMT (out of 5) Right 10/08/2022 Left 10/08/2022 Right 11/07/22 Left 11/07/22  Hip flexion _0 Hip extension 4+ 4- (cramp) 4+ 4+  Hip abduction 4+ 4+ 4+ 4+  Hip adduction        Hip internal rotation        Hip external rotation        Knee flexion _1 Knee extension 5 4+ 5 5  Ankle dorsiflexion        Ankle plantarflexion        Ankle inversion        Ankle eversion        (* =  pain; Blank rows = not tested)     Sensation Mild sensory loss in great toes to light touch bilateral LEs as determined by testing dermatomes L2-S2. Proprioception, and hot/cold testing deferred on this date.       Muscle Length Hamstrings: R: Not examined L: 10 deg lacking in 90/90 Quadriceps Pat Patrick): R: Positive L: Positive Hip flexors Marcello Moores): R: Not done L: Not done IT band Nicoletta Dress): R: Not done L: Not done Gastrocnemius: R: Normal  L: Normal  (10/09/22)   Palpation   Location LEFT  RIGHT           Quadriceps 0 0  Medial Hamstrings 0 0  Lateral Hamstrings 0 0  Lateral Hamstring tendon 0 0  Medial Hamstring tendon 0 0  Quadriceps tendon      Patella 0 0  Patellar Tendon      Tibial Tuberosity      Medial joint line 0 0  Lateral joint line 0 0  MCL 0 0  LCL 0 0  Adductor Tubercle      Pes Anserine tendon      Infrapatellar fat pad      Fibular head   0  Popliteal fossa   1 (mild)  (Blank rows = not tested) Graded on 0-4 scale (0 = no pain, 1 = pain, 2 = pain with wincing/grimacing/flinching, 3 = pain with withdrawal, 4 = unwilling to allow palpation), (Blank rows = not tested)     Passive Accessory Motion   Patellofemoral: Superior Glide: R: Positive for moderate restriction L: Positive for moderate restriction Inferior Glide:  R: Positive for moderate restriction L: Positive for moderate restriction Medial Glide: R: Negative L: Negative Lateral Glide: R: Negative L: Negative        SPECIAL TESTS   Lumbar Radiculopathy SLR: R: Negative, L: Negative SLUMP: R: Negative, : Negative Repeated flexion: mild discomfort in back during, no reproduction of concordant popliteal fossa pain    Meniscus Tests McMurray's Test:  Medial Meniscus (Tibial ER): R: Negative L: Negative Lateral Meniscus (Tibial IR): R: Negative L: Negative (-) meniscal signs per clinical prediction rule       FUNCTIONAL TASKS   Pt able to readily perform sit to stand with no upper extremity support         TODAY'S TREATMENT   SUBJECTIVE: Pt reports no notable knee pain at arrival. Pt has been working with ankle weights that he received from [employer]. Pt reports comorbid low back pain that has limited him from performing significant activity at home. He reports notable back pain at arrival. He reports no numbness/tingling. Patient reports no nocturnal night pain. He states it feels better at night. Pt reports pain first thing in AM that will often continue throughout the day in his low back. He reports tolerating weightbearing activity well recently c L knee.   PAIN:  Are you having pain? 2/10 pain at arrival to PT Pain location: L posterior knee/popliteal region     Manual Therapy - for symptom modulation, soft tissue sensitivity and mobility, joint mobility, ROM   L knee PROM with overpressure at end-range to check for motion loss or pain reproduction, pain reproduced with end-range knee extension   L Tibiofemoral joint mobilization gr I-II A-P, for pain control 2x30 sec bouts     *not today* STM along L hamstrings and medial gastrocnemius in prone lying; x 8 minutes L fibular head mobilization A-P, gr III for joint mobility; 2x30 sec bouts  Therapeutic Exercise - for improved soft tissue flexibility and extensibility as  needed for ROM, improved strength as needed to improve performance of CKC activities/functional movements  SciFit, x 5 minutes; Level 5, for soft tissue extensibility/mobility  -subjective information gathered during this time intermittently, 2 minutes unbilled   Squat to table with Black Tband around distal thighs to control for dynamic valgus of bilat LE; 2x10 Forward step-up; 2x10, 6-inch step  Lateral step-up; 2x10, 6-inch step (two steps side-by-side in // bars to accommodate bilateral feet on step) TRX lateral lunge; partial lunge 0-45 deg; 1x10 alternating R/L  PATIENT EDUCATION: Discussed potentially focusing on low back pending PT referral following completion of L knee case to focus on condition that is debilitating patient more at this time. Discussed plan of care with end-phase of PT approaching   *next visit* 3-way hip; with 4-lb ankle weights; x8 ea dir, bilat   *not today* Seated hamstring stretch; 3x30sec Sidelying hip abduction; 2x10, 2-lb;  Dynamic hamstrings stretch (sciatic nerve floss technique); 2x10 Minisquat 0-30 deg; 2x10, therapist guarding posteriorly Straight leg raise; 2x8 with 3-lb ankle weight - verbal cueing for maintaining quad contraction and eccentric control [employer] double-limb squat, Level 22; depth to tolerance; 2x10 Glute bridge; 2x10. Blue Tband above knees  Seated knee extension; 7.5 lbs 1x10, 10 lbs 1x10 Prone Hip extension 2x10 alternating  Standing calf stretch, lunge with upper limb support on treadmill; 2x30 sec  -focused on LLE in back for L calf flexibility, pt has onset of L knee pain with LLE in front Cold pack (unbilled) - for anti-inflammatory and analgesic effect as needed for reduced pain and improved ability to participate in active PT intervention, along L knee in supine with pillow behind L knee and calf, x 5 minutes  -utilized post-treatment today    MHP (unbilled) utilized during manual therapy for analgesic effect and  improved soft tissue extensibility, along low back in sitting; x 5 minutes.    PATIENT EDUCATION:  Education details: Exercise technique as needed for carryover to HEP Person educated: Patient Education method: Explanation Education comprehension: verbalized understanding     HOME EXERCISE PROGRAM: Access Code: [email or web address] URL: [email or web address] Date: 11/26/2022 Prepared by: Valentina Gu  Exercises - Seated Hamstring Stretch  - 2 x daily - 7 x weekly - 2 sets - 30sec hold - Gastroc Stretch on Wall  - 2 x daily - 7 x weekly - 3 sets - 30sec hold - Supine Active Straight Leg Raise  - 2 x daily - 7 x weekly - 2 sets - 10 reps - Sidelying Hip Abduction  - 1 x daily - 7 x weekly - 2 sets - 10 reps - Supine Bridge  - 1 x daily - 7 x weekly - 2 sets - 10 reps    ASSESSMENT:   CLINICAL IMPRESSION: Patient unfortunately has struggled with back pain that is worse in AM and has limited his ability to tolerate bending over and completing childcare duties and playing with his kids. His L knee is fortunately not bothering him remarkably this AM, though he does have intermittent discomfort with closed-chain loading with higher degree of knee flexion. Discussed with pt potential transition to focus on low back pending PT referral for low back pain given persisting pain that is affecting patient's quality of life and important life roles. Pt has remaining deficits in L gluteal strength, mild hamstring flexibility deficit, medial hamstring and calf sensitivity on L, sensory/proprioception impairment of lower limbs. Patient will benefit  from continued skilled PT to address above impairments and improve overall function and QoL.    REHAB POTENTIAL: Good   CLINICAL DECISION MAKING: Evolving/moderate complexity   EVALUATION COMPLEXITY: Moderate     GOALS:   SHORT TERM GOALS: Target date: 10/31/2022   Pt will be independent with HEP to improve strength and decrease knee pain to  improve pain-free function at home and work. Baseline: 10/08/22: Discussed stretching for home program, formal HEP to be given on visit # 2.  10/10/22: Formal HEP initiated.   11/07/22: Pt is compliant with HEP.  Goal status: ACHIEVED     LONG TERM GOALS: Target date: 11/21/2022   Pt will increase FOTO to at least 63 to demonstrate significant improvement in function at home and work related to knee pain  Baseline: 10/08/22: 45.   11/07/22:  49. Goal status: IN PROGRESS    2.  Pt will decrease worst knee pain by at least 3 points on the NPRS in order to demonstrate clinically significant reduction in back pain. Baseline: 10/08/22: Knee pain 9/10 at worst.   11/07/22: Pain up to 1-2/10 this past week.  Goal status: ACHIEVED   3.  Pt will increase strength of L hip and knee musculature measured at initial eval by at least 1/2 MMT grade in order to demonstrate improvement in strength and function as needed for improved ability to perform stair climbing and squatting Baseline: 10/08/22: Significant pain with repetitive stair climbing, squatting/bending down, and pivoting.    11/07/22: Improved hip flexor, hip extensor, and quad strength  Goal status: IN PROGRESS/ON-GOING   4.  Pt will tolerate standing activity up to 1 hour without exacerbation of knee pain as needed for completing community outings and errands in town Baseline: 10/08/22: Pt tolerate standing up to 20-30 minutes per self-report.     11/07/22: Standing up to 15 minutes Goal status: NOT MET    5.  Pt will perform 3-way lunge without loss of frontal plane control and no LOB or knee buckling with no exacerbation of knee pain indicative of improved ability to complete multi-planar movements with weightbearing on LLE and improved tolerance of pivoting as needed for functional mobility Baseline: 10/08/22: Significant pain with pivoting and turning when weightbearing on LLE.      11/07/22: Attempted today with pain primarily with forward  lunging Goal status: NOT MET     PLAN: PT FREQUENCY: 2x/week   PT DURATION: 6 weeks   PLANNED INTERVENTIONS: Therapeutic exercises, Therapeutic activity, Neuromuscular re-education, Balance training, Gait training, Patient/Family education, Electrical stimulation, Cryotherapy, Moist heat, Taping, and Manual therapy   PLAN FOR NEXT SESSION: STM/IASTM to improve soft tissue mobility and sensitivity along posterior thigh. Progress with quadriceps and hip abductor/extensor/flexor strengthening. Progress to closed-chain movements as tolerated as weightbearing activity tolerance improves.      Valentina Gu, PT, DPT #[id number]  Eilleen Kempf, PT 12/04/2022, 8:25 PM

## 2022-12-05 ENCOUNTER — Encounter: Payer: Self-pay | Admitting: Physical Therapy

## 2022-12-05 ENCOUNTER — Ambulatory Visit: Payer: Medicare Other | Admitting: Physical Therapy

## 2022-12-05 DIAGNOSIS — R262 Difficulty in walking, not elsewhere classified: Secondary | ICD-10-CM

## 2022-12-05 DIAGNOSIS — M6281 Muscle weakness (generalized): Secondary | ICD-10-CM

## 2022-12-05 DIAGNOSIS — G8929 Other chronic pain: Secondary | ICD-10-CM

## 2022-12-05 DIAGNOSIS — M25562 Pain in left knee: Secondary | ICD-10-CM | POA: Diagnosis not present

## 2022-12-05 NOTE — Therapy (Signed)
OUTPATIENT PHYSICAL THERAPY TREATMENT/GOAL UPDATE  Patient Name: William Lynn MRN: [id number] DOB:07-Jul-1961, 61 y.o., male Today's Date: 12/05/2022  PCP: [facility] REFERRING PROVIDER: Darrol Jump, MD  END OF SESSION:   PT End of Session - 12/05/22 0827     Visit Number 17    Number of Visits 18    Date for PT Re-Evaluation 01/05/23    Authorization Type eval 10/08/22    PT Start Time 0825    PT Stop Time 0905    PT Time Calculation (min) 40 min    Equipment Utilized During Treatment --   pt utlizies personal SPC for gait intermittently during episodes of heightened pain   Activity Tolerance Patient tolerated treatment well    Behavior During Therapy WFL for tasks assessed/performed               Past Medical History:  Diagnosis Date   GERD (gastroesophageal reflux disease)    Neuropathy    Sarcoidosis    Past Surgical History:  Procedure Laterality Date   HERNIA REPAIR     KNEE SURGERY Right    SHOULDER SURGERY Bilateral    Patient Active Problem List   Diagnosis Date Noted   TIA (transient ischemic attack) 10/13/2021   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   On corticosteroid therapy 06/21/2018   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   Gait abnormality 02/25/2018   Low serum thyroid stimulating hormone (TSH) 02/25/2018   Serum gammaglobulin increased 02/25/2018   Muscle weakness (generalized) 06/07/2014   Closed fracture of glenoid cavity and neck of scapula 06/03/2014   S/P arthroscopy of shoulder 06/03/2014   Stiffness of joint, not elsewhere classified,  shoulder region 06/03/2014    REFERRING DIAG:  M25.562 (ICD-10-CM) - Pain in left knee  G89.29 (ICD-10-CM) - Other chronic pain  R29.898 (ICD-10-CM) - Other symptoms and signs involving the musculoskeletal system  M71.22 (ICD-10-CM) - Synovial cyst of popliteal space (Baker), left knee  M76.892 (ICD-10-CM) - Other specified enthesopathies of left lower limb, excluding foot     THERAPY DIAG:  Chronic pain of left knee  Difficulty in walking, not elsewhere classified  Muscle weakness (generalized)  Rationale for Evaluation and Treatment Rehabilitation  PERTINENT HISTORY: Patient is a 61 year old male referred for L knee pain with Hx of R knee surgery and hx of TIA associated with hypertension. Patient has Baker's cyst L knee. Hx of L ACL tear with reconstruction about 30 years ago.  Patient reports having blood clot in R calf confirmed with Doppler (superficial venous thrombosis in greater saphenous vein). Pt is using compression stockings at this time.  Pt has pain in posterior knee/popliteal fossa. Patient reports having "a little back pain." Patient is unsure of back pain happens with posterior knee pain. Patient reports disturbed sleep from leg cramps. Patient reports atraumatic onset of pain.    Pain:  Pain Intensity: Present: 0/10, Best: 0/10, Worst: 9/10 Pain location: Posterior knee/popliteal fossa Pain quality: previously sharp, feeling of "puffiness" in posterior knee/popliteal region  Radiating pain: No  Swelling: Yes , posterior knee  Popping, catching, locking: Yes , catching with leg rotating in closed-chain position  Numbness/Tingling: Yes, hx of long-standing numbness/tingling in toes from peripheral neuropathy  Focal weakness or buckling: No Aggravating factors: twisting leg in closed-chain flexion, standing, turning/pivoting, repetitive stair climbing or prolonged walking around house, squatting/bending down  Relieving factors: sitting, non-weightbearing 24-hour pain behavior: continuous pain throughout day  How long can you stand: 20-30 minutes History of prior back, hip, or  knee injury, pain, surgery, or therapy: Yes, hx of L ACL reconstruction 30 years, hx of back pain  Falls: Has patient fallen in last 6 months? No, Number of falls: N/A Follow-up appointment with MD: F/u with MD after PT Imaging: Yes , Ultrasound R LE    IMPRESSION 1. No evidence of deep venous thrombosis in the RIGHT lower extremity. 2. Superficial venous thrombosis of the greater saphenous vein.   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[employer], difficulty with yard work/cutting grass c push mower Red flags (bowel/bladder changes, saddle paresthesia, personal history of cancer, h/o spinal tumors, h/o compression fx, h/o abdominal aneurysm, abdominal pain, chills/fever, night sweats, nausea, vomiting, unrelenting pain): Negative   Precautions: Superficial venous thrombosis of the greater saphenous vein in RLE   Weight Bearing Restrictions: No   Living Environment Lives with: lives with their spouse, nephew and his brother lives close by Lives in: House/apartment, stairs to get in home     Patient Goals: Resolved pain and "puffiness" in knee      PRECAUTIONS: Superficial venous thrombosis of the greater saphenous vein in RLE, Hx of transverse myelitis   OBJECTIVE: (objective measures completed at initial evaluation unless otherwise dated)  Patient Surveys  FOTO 45, predicted score of 2     Cognition Patient is oriented to person, place, and time.  Recent memory is intact.  Remote memory is intact.  Attention span and concentration are intact.  Expressive speech is intact.  Patient's fund of knowledge is within normal limits for educational level.                            Gross Musculoskeletal Assessment Tremor: None Bulk: Normal Tone: Normal Pt presents with significant L knee genu valgum, L ankle pronation      GAIT: Distance walked: 50 ft Assistive device utilized: Single point cane Level of assistance: SBA Comments: Mild dynamic valgus LLE, overpronation      AROM       AROM (Normal range in degrees) AROM  10/08/2022  Lumbar    Flexion (65) WNL  Extension (30) WNL (pain in back)  Right lateral flexion (25) WNL  Left lateral flexion (25) WNL  Right  rotation (30)    Left rotation (30)                  Knee      Flexion (135) WNL 125  Extension (0) +10 +8         Ankle      Dorsiflexion (20)  WNL WNL  Plantarflexion (50)      Inversion (35)      Eversion (15      (* = pain; Blank rows = not tested)     LE MMT:   MMT (out of 5) Right 10/08/2022 Left 10/08/2022 Right 11/07/22 Left 11/07/22 Right 12/05/22 Left 12/05/22  Hip flexion _0 Hip extension 4+ 4- (cramp) 4+ 4+ 4 4+  Hip abduction 4+ 4+ 4+ 4+ 4+ 5-  Hip adduction          Hip internal rotation          Hip external rotation          Knee flexion _1 Knee extension 5 4+ _2 Ankle dorsiflexion          Ankle plantarflexion  Ankle inversion          Ankle eversion          (* = pain; Blank rows = not tested)     Sensation Mild sensory loss in great toes to light touch bilateral LEs as determined by testing dermatomes L2-S2. Proprioception, and hot/cold testing deferred on this date.       Muscle Length Hamstrings: R: Not examined L: 10 deg lacking in 90/90 Quadriceps Pat Patrick): R: Positive L: Positive Hip flexors Marcello Moores): R: Not done L: Not done IT band Nicoletta Dress): R: Not done L: Not done Gastrocnemius: R: Normal  L: Normal  (10/09/22)   Palpation   Location LEFT  RIGHT           Quadriceps 0 0  Medial Hamstrings 0 0  Lateral Hamstrings 0 0  Lateral Hamstring tendon 0 0  Medial Hamstring tendon 0 0  Quadriceps tendon      Patella 0 0  Patellar Tendon      Tibial Tuberosity      Medial joint line 0 0  Lateral joint line 0 0  MCL 0 0  LCL 0 0  Adductor Tubercle      Pes Anserine tendon      Infrapatellar fat pad      Fibular head   0  Popliteal fossa   1 (mild)  (Blank rows = not tested) Graded on 0-4 scale (0 = no pain, 1 = pain, 2 = pain with wincing/grimacing/flinching, 3 = pain with withdrawal, 4 = unwilling to allow palpation), (Blank rows = not tested)     Passive Accessory Motion   Patellofemoral: Superior  Glide: R: Positive for moderate restriction L: Positive for moderate restriction Inferior Glide: R: Positive for moderate restriction L: Positive for moderate restriction Medial Glide: R: Negative L: Negative Lateral Glide: R: Negative L: Negative        SPECIAL TESTS   Lumbar Radiculopathy SLR: R: Negative, L: Negative SLUMP: R: Negative, : Negative Repeated flexion: mild discomfort in back during, no reproduction of concordant popliteal fossa pain    Meniscus Tests McMurray's Test:  Medial Meniscus (Tibial ER): R: Negative L: Negative Lateral Meniscus (Tibial IR): R: Negative L: Negative (-) meniscal signs per clinical prediction rule       FUNCTIONAL TASKS   Pt able to readily perform sit to stand with no upper extremity support         TODAY'S TREATMENT   SUBJECTIVE: Pt reports having near-fall yesterday when he was walking in his home. He reports feeling much better, 80% SANE score. Patient reports he has many factors contributing to his current condition with hx of LE weakness/transverse myelitis and back pain. He reports no significant knee pain at arrival to PT today. Patient reports he will talk to his PCP regarding back pain and potential PT referral.   PAIN:  Are you having pain? No pain at arrival  Pain location: L posterior knee/popliteal region     Therapeutic Exercise - for improved soft tissue flexibility and extensibility as needed for ROM, improved strength as needed to improve performance of CKC activities/functional movements  GOAL UPDATE*  SciFit, x 5 minutes; Level 5, for soft tissue extensibility/mobility  -subjective information gathered during this time intermittently, 2 minutes unbilled   Squat to table with Black Tband around distal thighs to control for dynamic valgus of bilat LE; 2x10 Forward step-up; 2x10, 6-inch step  Lateral step-up; 2x10, 6-inch step (two steps side-by-side in // bars  to accommodate bilateral feet on step) TRX lateral  lunge; partial lunge 0-45 deg; 1x10 alternating R/L  3-way lunge; x5 ea dir  PATIENT EDUCATION: Discussed potentially focusing on low back pending PT referral following completion of L knee case to focus on condition that is debilitating patient more at this time. Discussed plan of care with end-phase of PT approaching   *next visit* 3-way hip; with 4-lb ankle weights; x8 ea dir, bilat   *not today* Seated hamstring stretch; 3x30sec Sidelying hip abduction; 2x10, 2-lb;  Dynamic hamstrings stretch (sciatic nerve floss technique); 2x10 Minisquat 0-30 deg; 2x10, therapist guarding posteriorly Straight leg raise; 2x8 with 3-lb ankle weight - verbal cueing for maintaining quad contraction and eccentric control [employer] double-limb squat, Level 22; depth to tolerance; 2x10 Glute bridge; 2x10. Blue Tband above knees  Seated knee extension; 7.5 lbs 1x10, 10 lbs 1x10 Prone Hip extension 2x10 alternating  Standing calf stretch, lunge with upper limb support on treadmill; 2x30 sec  -focused on LLE in back for L calf flexibility, pt has onset of L knee pain with LLE in front Cold pack (unbilled) - for anti-inflammatory and analgesic effect as needed for reduced pain and improved ability to participate in active PT intervention, along L knee in supine with pillow behind L knee and calf, x 5 minutes  -utilized post-treatment today       PATIENT EDUCATION:  Education details: Exercise technique as needed for carryover to HEP Person educated: Patient Education method: Explanation Education comprehension: verbalized understanding     HOME EXERCISE PROGRAM: Access Code: [email or web address] URL: [email or web address] Date: 11/26/2022 Prepared by: Valentina Gu  Exercises - Seated Hamstring Stretch  - 2 x daily - 7 x weekly - 2 sets - 30sec hold - Gastroc Stretch on Wall  - 2 x daily - 7 x weekly - 3 sets - 30sec hold - Supine Active Straight Leg Raise  - 2 x daily - 7 x weekly - 2  sets - 10 reps - Sidelying Hip Abduction  - 1 x daily - 7 x weekly - 2 sets - 10 reps - Supine Bridge  - 1 x daily - 7 x weekly - 2 sets - 10 reps    ASSESSMENT:   CLINICAL IMPRESSION: Patient reports significant improvement with L knee condition and he reports doing well with PT to date. Pt does have continuous back pain that is worse in AM and limits household activities and his ability to play with his children. Patient has not met FOTO score following 2 re-tests and has not improved standing time tolerated; however, these are likely limited due to multiple factors contributing to patient's current condition with Hx of transverse myelitis and comorbid back pain and stiffness. These other factors do also limit patient's ability to perform activity-based items on FOTO outcome measure. Patient has significant improvement in NPRS and is able to perform 3-way lunge indicative of improved ability to perform mult-planar movement needed for pivoting. Pt currently feels he is ready to continue with independent HEP and is appropriate for this presently.     REHAB POTENTIAL: Good   CLINICAL DECISION MAKING: Evolving/moderate complexity   EVALUATION COMPLEXITY: Moderate     GOALS:   SHORT TERM GOALS: Target date: 10/31/2022   Pt will be independent with HEP to improve strength and decrease knee pain to improve pain-free function at home and work. Baseline: 10/08/22: Discussed stretching for home program, formal HEP to be given on visit # 2.  10/10/22: Formal HEP initiated.  11/07/22: Pt is compliant with HEP.  Goal status: ACHIEVED     LONG TERM GOALS: Target date: 11/21/2022   Pt will increase FOTO to at least 63 to demonstrate significant improvement in function at home and work related to knee pain  Baseline: 10/08/22: 45.   11/07/22:  49.  12/05/22: 43.  Goal status: NOT MET    2.  Pt will decrease worst knee pain by at least 3 points on the NPRS in order to demonstrate clinically significant  reduction in back pain. Baseline: 10/08/22: Knee pain 9/10 at worst.   11/07/22: Pain up to 1-2/10 this past week.  Goal status: ACHIEVED   3.  Pt will increase strength of L hip and knee musculature measured at initial eval by at least 1/2 MMT grade in order to demonstrate improvement in strength and function as needed for improved ability to perform stair climbing and squatting Baseline: 10/08/22: Significant pain with repetitive stair climbing, squatting/bending down, and pivoting.    11/07/22: Improved hip flexor, hip extensor, and quad strength.   12/05/22: All met with exception of R hip abduction and extension.  Goal status: PARTIALLY MET    4.  Pt will tolerate standing activity up to 1 hour without exacerbation of knee pain as needed for completing community outings and errands in town Baseline: 10/08/22: Pt tolerate standing up to 20-30 minutes per self-report.     11/07/22: Standing up to 15 minutes.  12/05/22: up to 30 minutes  Goal status: NOT MET    5.  Pt will perform 3-way lunge without loss of frontal plane control and no LOB or knee buckling with no exacerbation of knee pain indicative of improved ability to complete multi-planar movements with weightbearing on LLE and improved tolerance of pivoting as needed for functional mobility Baseline: 10/08/22: Significant pain with pivoting and turning when weightbearing on LLE.      11/07/22: Attempted today with pain primarily with forward lunging.  12/05/22:  performed today without notable LOB, no buckling, fleeting infrapatellar pain  Goal status: PARTIALLY MET    PLAN: PT FREQUENCY: -   PT DURATION: -   PLANNED INTERVENTIONS: Therapeutic exercises, Therapeutic activity, Neuromuscular re-education, Balance training, Gait training, Patient/Family education, Electrical stimulation, Cryotherapy, Moist heat, Taping, and Manual therapy   PLAN FOR NEXT SESSION: Pt continuing with independent home program for knee pain and related strength  deficits. Pt to f/u with PCP regarding referral for back pain     Valentina Gu, PT, DPT #[id number]  Eilleen Kempf, PT 12/05/2022, 8:28 AM

## 2022-12-17 ENCOUNTER — Ambulatory Visit
Admission: EM | Admit: 2022-12-17 | Discharge: 2022-12-17 | Disposition: A | Discharge status: Home / Self Care | Payer: Medicare Other | Attending: Family Medicine | Admitting: Family Medicine

## 2022-12-17 DIAGNOSIS — H1032 Unspecified acute conjunctivitis, left eye: Secondary | ICD-10-CM | POA: Diagnosis not present

## 2022-12-17 MED ORDER — ERYTHROMYCIN 5 MG/GM OP OINT
TOPICAL_OINTMENT | Freq: Once | OPHTHALMIC | Status: AC
  Administered 2022-12-17: 1 via OPHTHALMIC

## 2022-12-17 NOTE — Discharge Instructions (Addendum)
You are given an antibiotic ointment for your eye today.  Use a small ribbon (1 cm) in the lower lid of your eye 4 times a day for the next 7 days.

## 2022-12-17 NOTE — ED Provider Notes (Signed)
[facility]    CSN: [id number] Arrival date & time: 12/17/22  1942      History   Chief Complaint Chief Complaint  Patient presents with   Eye Problem    HPI HPI  William Lynn is a 61 y.o. male.    William Lynn presents for left eye discharge that started about a week ago.  William Lynn felt like something was in his eye. There was piece of string that he removed. Denies light sensitivity.  He flushed his eye with water.  Has been waking up with crusting in the morning. William Lynn does wear glasses.  William Lynn has not had any trouble seeing. Denies pain. William Lynn has otherwise been well and has no additional concerns today.   Fever : no  Cough: no Nasal congestion : no  Appetite: normal  Hydration: normal  Headache: no   Past Medical History:  Diagnosis Date   GERD (gastroesophageal reflux disease)    Neuropathy    Sarcoidosis     Patient Active Problem List   Diagnosis Date Noted   TIA (transient ischemic attack) 10/13/2021   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   On corticosteroid therapy 06/21/2018   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   Gait abnormality 02/25/2018   Low serum thyroid stimulating hormone (TSH) 02/25/2018   Serum gammaglobulin increased 02/25/2018   Muscle weakness (generalized) 06/07/2014   Closed fracture of glenoid cavity and neck of scapula 06/03/2014   S/P arthroscopy of shoulder 06/03/2014   Stiffness of joint, not elsewhere classified,  shoulder region 06/03/2014    Past Surgical History:  Procedure Laterality Date   HERNIA REPAIR     KNEE SURGERY Right    SHOULDER SURGERY Bilateral        Home Medications    Prior to Admission medications   Medication Sig Start Date End Date Taking? Authorizing Provider  alendronate (FOSAMAX) 70 MG tablet Take 70 mg by mouth every 7 (seven) days. Must sit or walk for 30 minutes after taking the tablet. 08/30/21   [provider]  aspirin EC 81 MG EC tablet Take 1 tablet (81 mg total)  by mouth daily. Swallow whole. 10/15/21   Wouk, Wilfred Curtis, MD  atorvastatin (LIPITOR) 80 MG tablet Take 1 tablet (80 mg total) by mouth daily. 10/15/21   Wouk, Wilfred Curtis, MD  benzonatate (TESSALON) 200 MG capsule Take 1 capsule (200 mg total) by mouth 3 (three) times daily as needed for cough. 03/25/22   Shirlee Latch, PA-C  Cholecalciferol (VITAMIN D3) 50 MCG (2000 UT) TABS Take 4,000 Units by mouth daily.    [provider]  clobetasol ointment (TEMOVATE) 0.05 % Apply 1 application topically daily as needed for rash. On back 05/14/21   [provider]  dapsone 100 MG tablet Take 100 mg by mouth daily. 10/09/21   [provider]  ipratropium (ATROVENT) 0.06 % nasal spray Place 2 sprays into both nostrils 4 (four) times daily. 03/25/22   Shirlee Latch, PA-C  losartan (COZAAR) 25 MG tablet Take 1 tablet by mouth daily. 03/14/22 03/14/23  [provider]  pantoprazole (PROTONIX) 20 MG tablet Take 20 mg by mouth daily. 11/03/18   [provider]  predniSONE (DELTASONE) 10 MG tablet Take 10 mg by mouth daily. 09/21/21   [provider]  pregabalin (LYRICA) 75 MG capsule Take 75 mg by mouth 2 (two) times daily. Take 75 mg in the morning and 150 mg at bedtime. 04/17/20   [provider]  promethazine-dextromethorphan (PROMETHAZINE-DM)  6.25-15 MG/5ML syrup Take 5 mLs by mouth 3 (three) times daily as needed. 03/25/22   Eusebio Friendly B, PA-C  tiZANidine (ZANAFLEX) 2 MG tablet Take 2 mg by mouth 3 (three) times daily. 09/25/21   [provider]    Family History Family History  Problem Relation Age of Onset   Other Mother        unknown medical history   Other Father        unknown medical history    Social History Social History   Tobacco Use   Smoking status: Former    Types: Cigarettes    Quit date: 05/12/1990    Years since quitting: 32.6   Smokeless tobacco: Never   Tobacco comments:    2-3 cigarette per day  Vaping  Use   Vaping Use: Never used  Substance Use Topics   Alcohol use: Never   Drug use: Never     Allergies   Mycophenolate mofetil   Review of Systems Review of Systems : negative unless otherwise stated in HPI.      Physical Exam Triage Vital Signs ED Triage Vitals  Enc Vitals Group     BP 12/17/22 2101 134/83     Pulse Rate 12/17/22 2101 89     Resp 12/17/22 2101 16     Temp 12/17/22 2101 98.1 F (36.7 C)     Temp Source 12/17/22 2101 Temporal     SpO2 12/17/22 2101 99 %     Weight --      Height --      Head Circumference --      Peak Flow --      Pain Score 12/17/22 2100 0     Pain Loc --      Pain Edu? --      Excl. in GC? --    No data found.  Updated Vital Signs BP 134/83 (BP Location: Left Arm)   Pulse 89   Temp 98.1 F (36.7 C) (Temporal)   Resp 16   SpO2 99%   Visual Acuity Right Eye Distance:   Left Eye Distance:   Bilateral Distance:    Right Eye Near:   Left Eye Near:    Bilateral Near:     Physical Exam  GEN: pleasant well appearing male, in no acute distress  NECK: normal ROM  CV: regular rate  RESP: no increased work of breathing EYES:     General: Lids are normal. Lids are everted, no foreign bodies appreciated. Vision grossly intact. Gaze aligned appropriately.        Right eye: No discharge.        Left eye: No foreign body or hordeolum.  Eye lashes and canthus with mucoid discharge     Extraocular Movements: Extraocular movements intact and without pain     Conjunctiva/sclera:     Left eye: Left conjunctiva is injected. No chemosis or hemorrhage. SKIN: warm and dry   UC Treatments / Results  Labs (all labs ordered are listed, but only abnormal results are displayed) Labs Reviewed - No data to display  EKG   Radiology No results found.  Procedures Procedures (including critical care time)  Medications Ordered in UC Medications  erythromycin ophthalmic ointment (1 Application Left Eye Given 12/17/22 2141)     Initial Impression / Assessment and Plan / UC Course  I have reviewed the triage vital signs and the nursing notes.  Pertinent labs & imaging results that were available during my care of  the patient were reviewed by me and considered in my medical decision making (see chart for details).     Patient is a 61 y.o. male who presents after  left discharge for the past  week.  On exam, he has evidence of conjunctivitis on the left. Treat with erythromycin ointment which was applied here in the clinic tonight..  Advised to follow-up with an ophthalmologist or optometrist, if  discomfort/pain is not improving after 5 day course.  Discussed MDM, treatment plan and plan for follow-up with patient who agrees with plan.  Final Clinical Impressions(s) / UC Diagnoses   Final diagnoses:  Acute bacterial conjunctivitis of left eye     Discharge Instructions      You are given an antibiotic ointment for your eye today.  Use a small ribbon (1 cm) in the lower lid of your eye 4 times a day for the next 7 days.     ED Prescriptions   None    PDMP not reviewed this encounter.   Katha Cabal, DO 12/17/22 2303

## 2022-12-17 NOTE — ED Triage Notes (Signed)
Patient presents to UC for left eye redness, drainage, and irritation x 2 days. Concerned with pink eye.

## 2023-09-13 IMAGING — CT CT HEAD CODE STROKE
3 series · 16 of 47 positions shown, 19 images · non-contrast
Comparison: None.

CLINICAL DATA: Code stroke.  Neuro deficit, acute, stroke suspected

EXAM:
CT HEAD WITHOUT CONTRAST
TECHNIQUE: Contiguous axial images were obtained from the base of the skull
through the vertex without intravenous contrast.

[Series 3: head wo · axial · 0.43mm/px · z∈[-168,-23]mm · 10 of 35 slices shown, 13 images]
[im 3/35  brain]
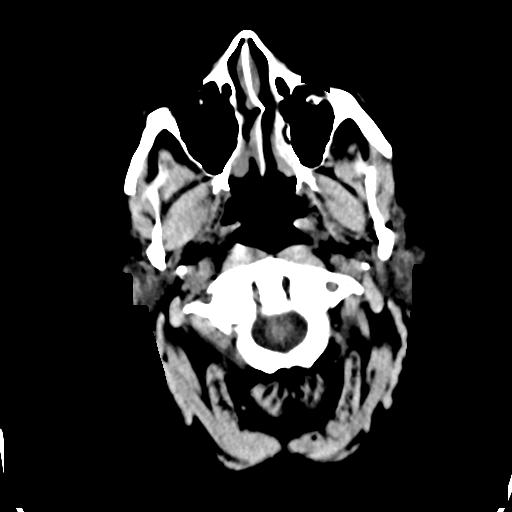
[im 3/35  bone]
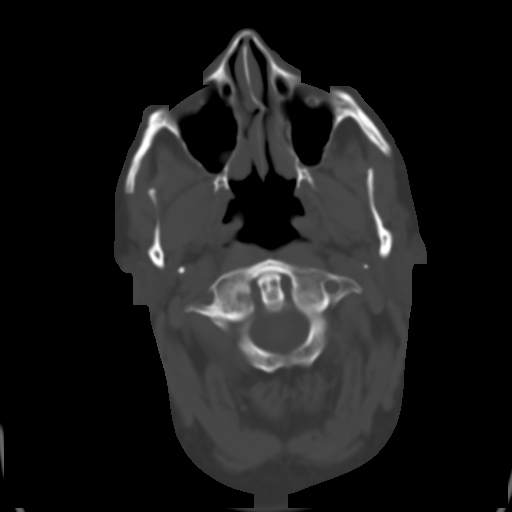
[im 6/35  brain]
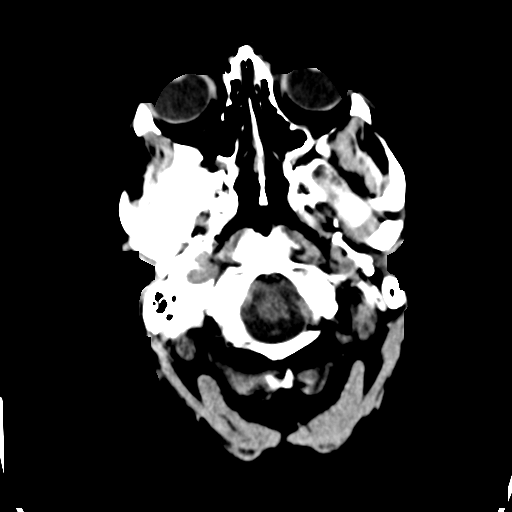
[im 10/35  brain]
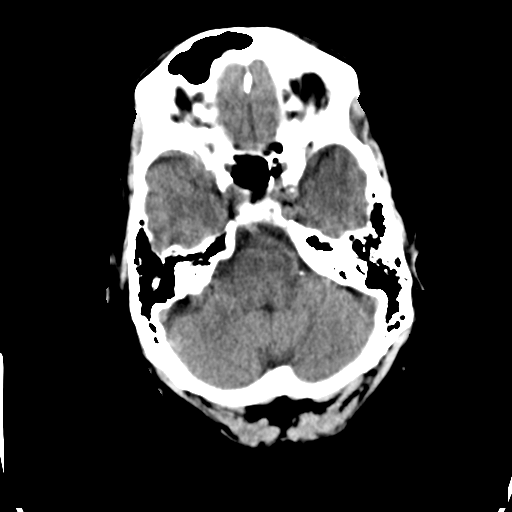
[im 12/35  brain]
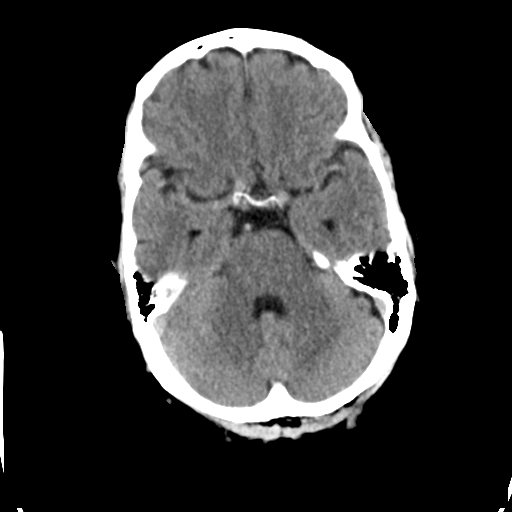
[im 16/35  brain]
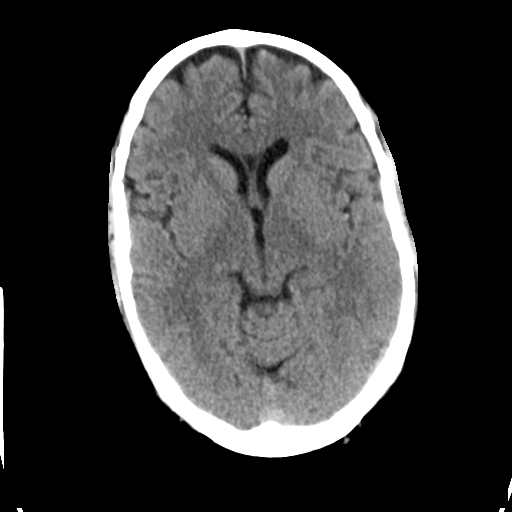
[im 16/35  bone]
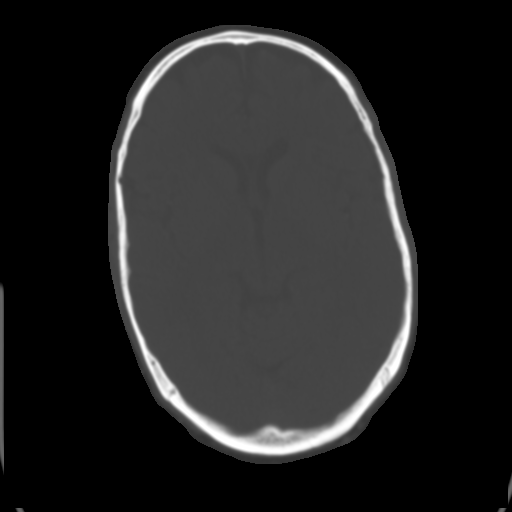
[im 19/35  brain]
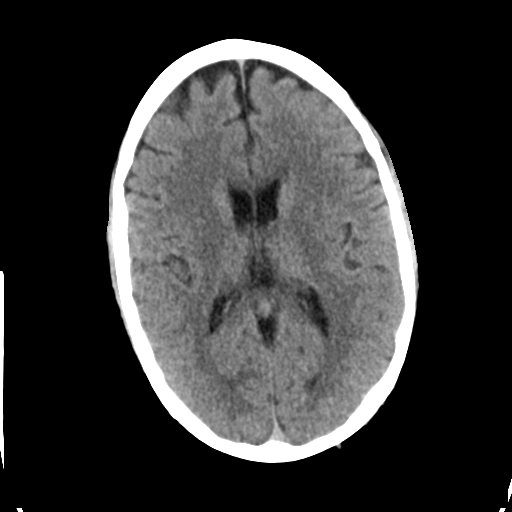
[im 23/35  brain]
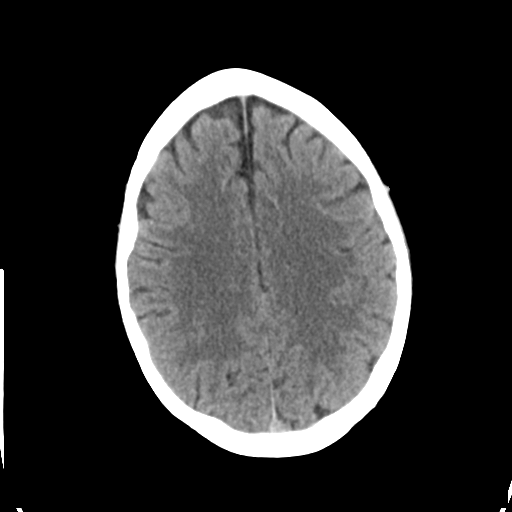
[im 26/35  brain]
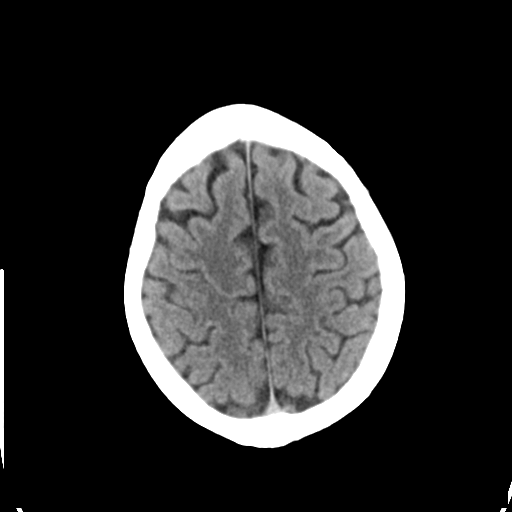
[im 29/35  brain]
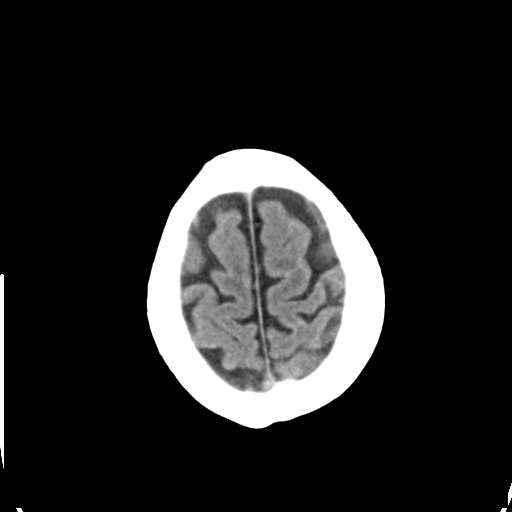
[im 29/35  bone]
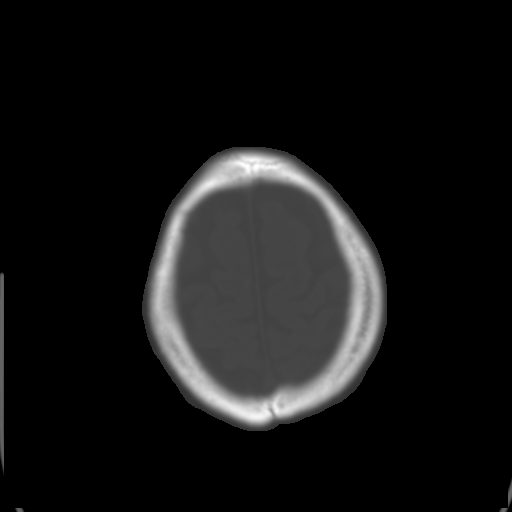
[im 32/35  brain]
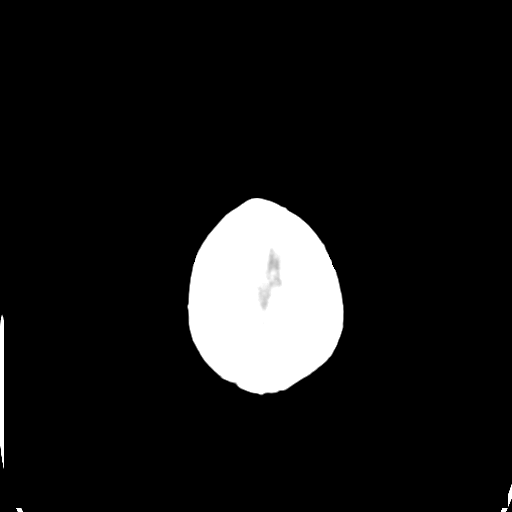

[Series 5: coronal soft tissue · coronal · 0.31mm/px · 3 of 68 slices shown]
[im 23/68  brain]
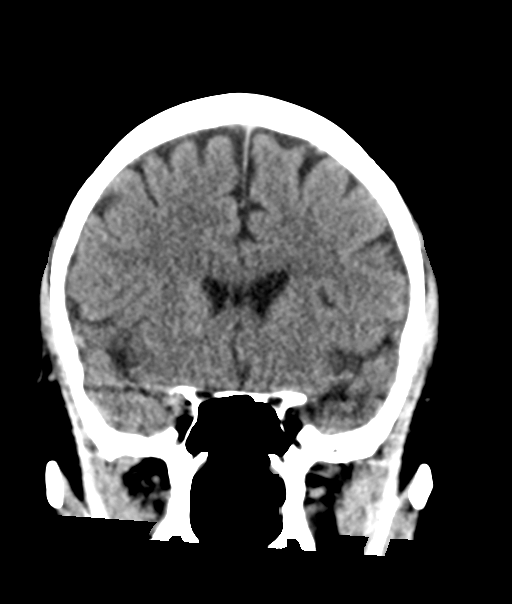
[im 30/68  brain]
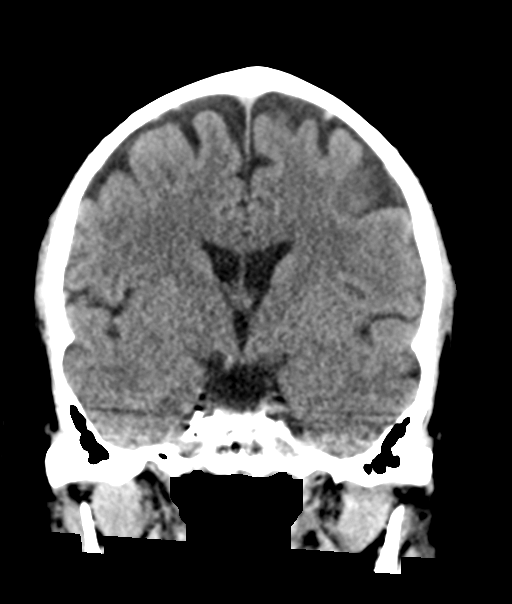
[im 38/68  brain]
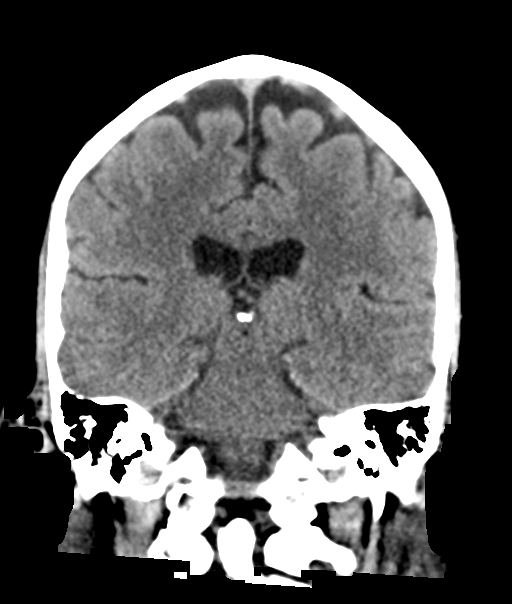

[Series 6: sagittal soft tissue · sagittal · 0.37mm/px · 3 of 52 slices shown]
[im 18/52  brain]
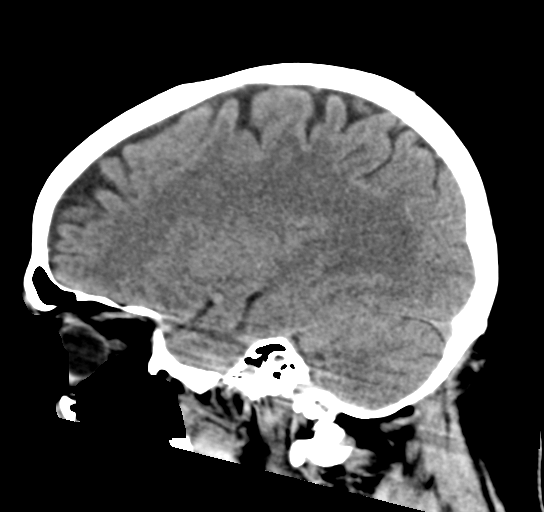
[im 26/52  brain]
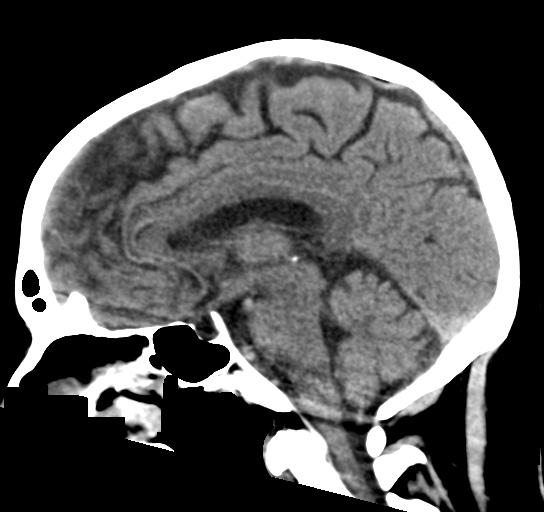
[im 35/52  brain]
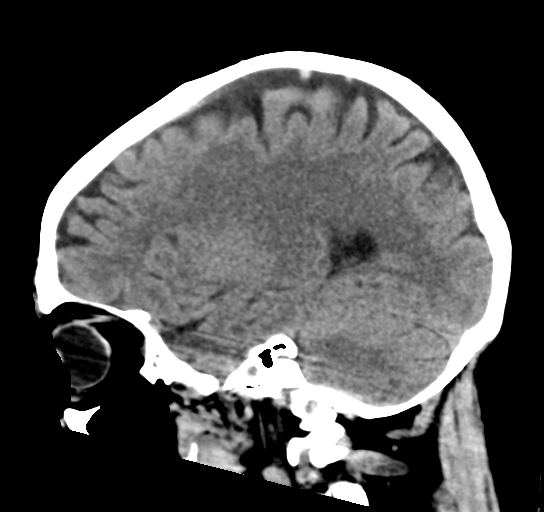

[16 of 47 positions shown; findings below may reference images not displayed]

FINDINGS: Brain: No evidence of acute infarction, hemorrhage, hydrocephalus,
extra-axial collection or mass lesion/mass effect. Discrete
hypodensity in the inferior right basal ganglia.

Vascular: No hyperdense vessel identified

Skull: No acute fracture.

Sinuses/Orbits: Retention cyst in the right maxillary sinus. Mild
ethmoid air cell mucosal thickening. No acute orbital findings.

Other: No mastoid effusions

ASPECTS (Alberta Stroke Program Early CT Score) Total score (0-10
with 10 being normal): 10.
IMPRESSION: 1. No evidence of acute large vascular territory infarct or acute
hemorrhage. ASPECTS is 10.
2. Discrete hypodensity in the inferior right basal ganglia,
compatible with dilated perivascular space (favored) or remote
lacunar infarct.

Code stroke imaging results were communicated on 10/13/2021 at [DATE] to provider Dr. Trisha via telephone, who verbally acknowledged
these results.
